# Patient Record
Sex: Female | Born: 1945 | Race: Black or African American | Hispanic: No | Smoking: Never smoker
Health system: Southern US, Community
[De-identification: ages and names within clinical notes are randomized; demographics above are authoritative.]

## PROBLEM LIST (undated history)

## (undated) DIAGNOSIS — F329 Major depressive disorder, single episode, unspecified: Secondary | ICD-10-CM

## (undated) DIAGNOSIS — F209 Schizophrenia, unspecified: Secondary | ICD-10-CM

## (undated) DIAGNOSIS — R0600 Dyspnea, unspecified: Secondary | ICD-10-CM

## (undated) DIAGNOSIS — M1990 Unspecified osteoarthritis, unspecified site: Secondary | ICD-10-CM

## (undated) DIAGNOSIS — I5030 Unspecified diastolic (congestive) heart failure: Secondary | ICD-10-CM

## (undated) DIAGNOSIS — C50919 Malignant neoplasm of unspecified site of unspecified female breast: Secondary | ICD-10-CM

## (undated) DIAGNOSIS — R110 Nausea: Secondary | ICD-10-CM

## (undated) DIAGNOSIS — I779 Disorder of arteries and arterioles, unspecified: Secondary | ICD-10-CM

## (undated) DIAGNOSIS — K219 Gastro-esophageal reflux disease without esophagitis: Secondary | ICD-10-CM

## (undated) DIAGNOSIS — F419 Anxiety disorder, unspecified: Secondary | ICD-10-CM

## (undated) DIAGNOSIS — F32A Depression, unspecified: Secondary | ICD-10-CM

## (undated) DIAGNOSIS — I10 Essential (primary) hypertension: Secondary | ICD-10-CM

## (undated) HISTORY — DX: Nausea: R11.0

## (undated) HISTORY — DX: Malignant neoplasm of unspecified site of unspecified female breast: C50.919

## (undated) HISTORY — PX: BREAST SURGERY: SHX581

## (undated) HISTORY — PX: TONSILLECTOMY: SUR1361

## (undated) HISTORY — PX: MASTECTOMY: SHX3

## (undated) HISTORY — DX: Gastro-esophageal reflux disease without esophagitis: K21.9

---

## 2010-01-05 ENCOUNTER — Ambulatory Visit: Payer: Self-pay | Admitting: Diagnostic Radiology

## 2010-01-05 ENCOUNTER — Emergency Department (HOSPITAL_BASED_OUTPATIENT_CLINIC_OR_DEPARTMENT_OTHER): Admission: EM | Admit: 2010-01-05 | Discharge: 2010-01-05 | Payer: Self-pay | Admitting: Emergency Medicine

## 2010-03-01 ENCOUNTER — Ambulatory Visit: Payer: Self-pay | Admitting: Oncology

## 2010-03-09 LAB — COMPREHENSIVE METABOLIC PANEL
ALT: <8 U/L (ref 0–35)
AST: 12 U/L (ref 0–37)
Albumin: 4.2 g/dL (ref 3.5–5.2)
Alkaline Phosphatase: 72 U/L (ref 39–117)
BUN: 12 mg/dL (ref 6–23)
CO2: 27 mEq/L (ref 19–32)
Calcium: 8.9 mg/dL (ref 8.4–10.5)
Chloride: 102 mEq/L (ref 96–112)
Creatinine, Ser: 0.83 mg/dL (ref 0.40–1.20)
Glucose, Bld: 95 mg/dL (ref 70–99)
Potassium: 3.7 mEq/L (ref 3.5–5.3)
Sodium: 138 mEq/L (ref 135–145)
Total Bilirubin: 0.3 mg/dL (ref 0.3–1.2)
Total Protein: 7 g/dL (ref 6.0–8.3)

## 2010-03-09 LAB — CBC WITH DIFFERENTIAL/PLATELET
BASO%: 0.6 % (ref 0.0–2.0)
Basophils Absolute: 0 10*3/uL (ref 0.0–0.1)
EOS%: 1.3 % (ref 0.0–7.0)
Eosinophils Absolute: 0.1 10*3/uL (ref 0.0–0.5)
HCT: 34.3 % — ABNORMAL LOW (ref 34.8–46.6)
HGB: 11.6 g/dL (ref 11.6–15.9)
LYMPH%: 25.5 % (ref 14.0–49.7)
MCH: 28.2 pg (ref 25.1–34.0)
MCHC: 33.8 g/dL (ref 31.5–36.0)
MCV: 83.4 fL (ref 79.5–101.0)
MONO#: 0.6 10*3/uL (ref 0.1–0.9)
MONO%: 9.9 % (ref 0.0–14.0)
NEUT#: 3.6 10*3/uL (ref 1.5–6.5)
NEUT%: 62.7 % (ref 38.4–76.8)
Platelets: 226 10*3/uL (ref 145–400)
RBC: 4.11 10*6/uL (ref 3.70–5.45)
RDW: 14.3 % (ref 11.2–14.5)
WBC: 5.8 10*3/uL (ref 3.9–10.3)
lymph#: 1.5 10*3/uL (ref 0.9–3.3)

## 2010-03-09 LAB — CANCER ANTIGEN 27.29: CA 27.29: 24 U/mL (ref 0–39)

## 2010-03-12 ENCOUNTER — Emergency Department (HOSPITAL_COMMUNITY)
Admission: EM | Admit: 2010-03-12 | Discharge: 2010-03-12 | Payer: Self-pay | Source: Home / Self Care | Admitting: Emergency Medicine

## 2010-03-21 LAB — URINE CULTURE
Colony Count: 10000
Culture  Setup Time: 201201071721

## 2010-03-21 LAB — DIFFERENTIAL
Basophils Absolute: 0 10*3/uL (ref 0.0–0.1)
Basophils Relative: 1 % (ref 0–1)
Eosinophils Absolute: 0.1 10*3/uL (ref 0.0–0.7)
Eosinophils Relative: 2 % (ref 0–5)
Lymphocytes Relative: 27 % (ref 12–46)
Lymphs Abs: 1.6 10*3/uL (ref 0.7–4.0)
Monocytes Absolute: 0.5 10*3/uL (ref 0.1–1.0)
Monocytes Relative: 8 % (ref 3–12)
Neutro Abs: 3.6 10*3/uL (ref 1.7–7.7)
Neutrophils Relative %: 63 % (ref 43–77)

## 2010-03-21 LAB — URINALYSIS, ROUTINE W REFLEX MICROSCOPIC
Bilirubin Urine: NEGATIVE
Hgb urine dipstick: NEGATIVE
Ketones, ur: NEGATIVE mg/dL
Nitrite: NEGATIVE
Protein, ur: NEGATIVE mg/dL
Specific Gravity, Urine: 1.015 (ref 1.005–1.030)
Urine Glucose, Fasting: NEGATIVE mg/dL
Urobilinogen, UA: 0.2 mg/dL (ref 0.0–1.0)
pH: 7 (ref 5.0–8.0)

## 2010-03-21 LAB — RAPID URINE DRUG SCREEN, HOSP PERFORMED
Amphetamines: NOT DETECTED
Barbiturates: NOT DETECTED
Benzodiazepines: NOT DETECTED
Cocaine: NOT DETECTED
Opiates: NOT DETECTED
Tetrahydrocannabinol: NOT DETECTED

## 2010-03-21 LAB — CBC
HCT: 37.2 % (ref 36.0–46.0)
Hemoglobin: 11.8 g/dL — ABNORMAL LOW (ref 12.0–15.0)
MCH: 27.1 pg (ref 26.0–34.0)
MCHC: 31.7 g/dL (ref 30.0–36.0)
MCV: 85.3 fL (ref 78.0–100.0)
Platelets: 226 10*3/uL (ref 150–400)
RBC: 4.36 MIL/uL (ref 3.87–5.11)
RDW: 13.8 % (ref 11.5–15.5)
WBC: 5.8 10*3/uL (ref 4.0–10.5)

## 2010-03-21 LAB — COMPREHENSIVE METABOLIC PANEL
ALT: 10 U/L (ref 0–35)
AST: 15 U/L (ref 0–37)
Albumin: 3.8 g/dL (ref 3.5–5.2)
Alkaline Phosphatase: 71 U/L (ref 39–117)
BUN: 9 mg/dL (ref 6–23)
CO2: 27 mEq/L (ref 19–32)
Calcium: 9.1 mg/dL (ref 8.4–10.5)
Chloride: 107 mEq/L (ref 96–112)
Creatinine, Ser: 0.86 mg/dL (ref 0.4–1.2)
GFR calc Af Amer: >60 mL/min (ref >60)
GFR calc non Af Amer: >60 mL/min (ref >60)
Glucose, Bld: 99 mg/dL (ref 70–99)
Potassium: 4.1 mEq/L (ref 3.5–5.1)
Sodium: 142 mEq/L (ref 135–145)
Total Bilirubin: 0.6 mg/dL (ref 0.3–1.2)
Total Protein: 6.8 g/dL (ref 6.0–8.3)

## 2010-03-21 LAB — SAMPLE TO BLOOD BANK

## 2010-03-21 LAB — ETHANOL: Alcohol, Ethyl (B): <5 mg/dL (ref 0–10)

## 2010-03-23 ENCOUNTER — Ambulatory Visit (HOSPITAL_COMMUNITY)
Admission: RE | Admit: 2010-03-23 | Discharge: 2010-03-23 | Payer: Self-pay | Source: Home / Self Care | Attending: Oncology | Admitting: Oncology

## 2010-04-20 ENCOUNTER — Other Ambulatory Visit: Payer: Self-pay | Admitting: Oncology

## 2010-04-20 DIAGNOSIS — Z853 Personal history of malignant neoplasm of breast: Secondary | ICD-10-CM

## 2010-04-20 DIAGNOSIS — Z780 Asymptomatic menopausal state: Secondary | ICD-10-CM

## 2010-04-27 ENCOUNTER — Ambulatory Visit
Admission: RE | Admit: 2010-04-27 | Discharge: 2010-04-27 | Disposition: A | Discharge status: Home / Self Care | Payer: Self-pay | Source: Ambulatory Visit | Attending: Oncology | Admitting: Oncology

## 2010-04-27 DIAGNOSIS — Z780 Asymptomatic menopausal state: Secondary | ICD-10-CM

## 2010-04-27 DIAGNOSIS — Z853 Personal history of malignant neoplasm of breast: Secondary | ICD-10-CM

## 2010-05-03 ENCOUNTER — Other Ambulatory Visit: Payer: Self-pay | Admitting: Oncology

## 2010-05-03 ENCOUNTER — Ambulatory Visit (HOSPITAL_COMMUNITY): Payer: Medicare Other | Attending: Oncology

## 2010-05-03 DIAGNOSIS — N289 Disorder of kidney and ureter, unspecified: Secondary | ICD-10-CM

## 2010-05-10 ENCOUNTER — Ambulatory Visit (HOSPITAL_COMMUNITY)
Admission: RE | Admit: 2010-05-10 | Discharge: 2010-05-10 | Disposition: A | Discharge status: Home / Self Care | Payer: Medicare Other | Source: Ambulatory Visit | Attending: Oncology | Admitting: Oncology

## 2010-05-10 DIAGNOSIS — Z853 Personal history of malignant neoplasm of breast: Secondary | ICD-10-CM | POA: Insufficient documentation

## 2010-05-10 DIAGNOSIS — N289 Disorder of kidney and ureter, unspecified: Secondary | ICD-10-CM

## 2010-05-10 DIAGNOSIS — N281 Cyst of kidney, acquired: Secondary | ICD-10-CM | POA: Insufficient documentation

## 2010-05-17 ENCOUNTER — Other Ambulatory Visit: Payer: Self-pay | Admitting: Oncology

## 2010-05-17 ENCOUNTER — Encounter (HOSPITAL_BASED_OUTPATIENT_CLINIC_OR_DEPARTMENT_OTHER): Payer: Medicare Other | Admitting: Oncology

## 2010-05-17 DIAGNOSIS — C50919 Malignant neoplasm of unspecified site of unspecified female breast: Secondary | ICD-10-CM

## 2010-05-17 LAB — URINALYSIS, ROUTINE W REFLEX MICROSCOPIC
Bilirubin Urine: NEGATIVE
Glucose, UA: NEGATIVE mg/dL
Hgb urine dipstick: NEGATIVE
Ketones, ur: 15 mg/dL — AB
Nitrite: NEGATIVE
Protein, ur: NEGATIVE mg/dL
Specific Gravity, Urine: 1.009 (ref 1.005–1.030)
Urobilinogen, UA: 0.2 mg/dL (ref 0.0–1.0)
pH: 7.5 (ref 5.0–8.0)

## 2010-05-17 LAB — BASIC METABOLIC PANEL
BUN: 10 mg/dL (ref 6–23)
CO2: 26 mEq/L (ref 19–32)
Calcium: 9.8 mg/dL (ref 8.4–10.5)
Chloride: 107 mEq/L (ref 96–112)
Creatinine, Ser: 0.8 mg/dL (ref 0.4–1.2)
GFR calc Af Amer: >60 mL/min (ref >60)
GFR calc non Af Amer: >60 mL/min (ref >60)
Glucose, Bld: 96 mg/dL (ref 70–99)
Potassium: 4.1 mEq/L (ref 3.5–5.1)
Sodium: 146 mEq/L — ABNORMAL HIGH (ref 135–145)

## 2010-05-17 LAB — DIFFERENTIAL
Basophils Absolute: 0.1 10*3/uL (ref 0.0–0.1)
Basophils Relative: 1 % (ref 0–1)
Eosinophils Absolute: 0 10*3/uL (ref 0.0–0.7)
Eosinophils Relative: 0 % (ref 0–5)
Lymphocytes Relative: 23 % (ref 12–46)
Lymphs Abs: 1.8 10*3/uL (ref 0.7–4.0)
Monocytes Absolute: 0.3 10*3/uL (ref 0.1–1.0)
Monocytes Relative: 4 % (ref 3–12)
Neutro Abs: 5.6 10*3/uL (ref 1.7–7.7)
Neutrophils Relative %: 73 % (ref 43–77)

## 2010-05-17 LAB — POCT TOXICOLOGY PANEL

## 2010-05-17 LAB — CBC WITH DIFFERENTIAL/PLATELET
BASO%: 0.5 % (ref 0.0–2.0)
Basophils Absolute: 0 10*3/uL (ref 0.0–0.1)
EOS%: 2.9 % (ref 0.0–7.0)
Eosinophils Absolute: 0.2 10*3/uL (ref 0.0–0.5)
HCT: 35.8 % (ref 34.8–46.6)
HGB: 11.3 g/dL — ABNORMAL LOW (ref 11.6–15.9)
LYMPH%: 36.4 % (ref 14.0–49.7)
MCH: 25.3 pg (ref 25.1–34.0)
MCHC: 31.6 g/dL (ref 31.5–36.0)
MCV: 80.1 fL (ref 79.5–101.0)
MONO#: 0.8 10*3/uL (ref 0.1–0.9)
MONO%: 10.4 % (ref 0.0–14.0)
NEUT#: 3.8 10*3/uL (ref 1.5–6.5)
NEUT%: 49.8 % (ref 38.4–76.8)
Platelets: 260 10*3/uL (ref 145–400)
RBC: 4.47 10*6/uL (ref 3.70–5.45)
RDW: 15.2 % — ABNORMAL HIGH (ref 11.2–14.5)
WBC: 7.5 10*3/uL (ref 3.9–10.3)
lymph#: 2.7 10*3/uL (ref 0.9–3.3)
nRBC: 0 % (ref 0–0)

## 2010-05-17 LAB — CBC
HCT: 42.9 % (ref 36.0–46.0)
Hemoglobin: 14.3 g/dL (ref 12.0–15.0)
MCH: 28.1 pg (ref 26.0–34.0)
MCHC: 33.4 g/dL (ref 30.0–36.0)
MCV: 83.9 fL (ref 78.0–100.0)
Platelets: 233 10*3/uL (ref 150–400)
RBC: 5.11 MIL/uL (ref 3.87–5.11)
RDW: 14.9 % (ref 11.5–15.5)
WBC: 7.8 10*3/uL (ref 4.0–10.5)

## 2010-05-17 LAB — ETHANOL: Alcohol, Ethyl (B): <10 mg/dL (ref 0–10)

## 2010-08-09 ENCOUNTER — Encounter (HOSPITAL_BASED_OUTPATIENT_CLINIC_OR_DEPARTMENT_OTHER): Payer: Medicare Other | Admitting: Oncology

## 2010-08-09 ENCOUNTER — Other Ambulatory Visit: Payer: Self-pay | Admitting: Oncology

## 2010-08-09 DIAGNOSIS — C50919 Malignant neoplasm of unspecified site of unspecified female breast: Secondary | ICD-10-CM

## 2010-08-09 LAB — COMPREHENSIVE METABOLIC PANEL
ALT: 10 U/L (ref 0–35)
AST: 15 U/L (ref 0–37)
Albumin: 4.2 g/dL (ref 3.5–5.2)
Alkaline Phosphatase: 86 U/L (ref 39–117)
BUN: 18 mg/dL (ref 6–23)
CO2: 30 mEq/L (ref 19–32)
Calcium: 9.2 mg/dL (ref 8.4–10.5)
Chloride: 105 mEq/L (ref 96–112)
Creatinine, Ser: 0.92 mg/dL (ref 0.50–1.10)
Glucose, Bld: 73 mg/dL (ref 70–99)
Potassium: 4.1 mEq/L (ref 3.5–5.3)
Sodium: 138 mEq/L (ref 135–145)
Total Bilirubin: 0.2 mg/dL — ABNORMAL LOW (ref 0.3–1.2)
Total Protein: 6.6 g/dL (ref 6.0–8.3)

## 2010-08-09 LAB — CBC WITH DIFFERENTIAL/PLATELET
BASO%: 0.9 % (ref 0.0–2.0)
Basophils Absolute: 0.1 10*3/uL (ref 0.0–0.1)
EOS%: 2.4 % (ref 0.0–7.0)
Eosinophils Absolute: 0.2 10*3/uL (ref 0.0–0.5)
HCT: 33.1 % — ABNORMAL LOW (ref 34.8–46.6)
HGB: 11 g/dL — ABNORMAL LOW (ref 11.6–15.9)
LYMPH%: 35.9 % (ref 14.0–49.7)
MCH: 25.5 pg (ref 25.1–34.0)
MCHC: 33.3 g/dL (ref 31.5–36.0)
MCV: 76.7 fL — ABNORMAL LOW (ref 79.5–101.0)
MONO#: 0.6 10*3/uL (ref 0.1–0.9)
MONO%: 9.3 % (ref 0.0–14.0)
NEUT#: 3.3 10*3/uL (ref 1.5–6.5)
NEUT%: 51.5 % (ref 38.4–76.8)
Platelets: 274 10*3/uL (ref 145–400)
RBC: 4.32 10*6/uL (ref 3.70–5.45)
RDW: 16 % — ABNORMAL HIGH (ref 11.2–14.5)
WBC: 6.3 10*3/uL (ref 3.9–10.3)
lymph#: 2.3 10*3/uL (ref 0.9–3.3)

## 2010-08-09 LAB — CANCER ANTIGEN 27.29: CA 27.29: 27 U/mL (ref 0–39)

## 2010-08-16 ENCOUNTER — Encounter (HOSPITAL_COMMUNITY): Payer: Self-pay

## 2010-08-16 ENCOUNTER — Ambulatory Visit (HOSPITAL_COMMUNITY)
Admission: RE | Admit: 2010-08-16 | Discharge: 2010-08-16 | Disposition: A | Discharge status: Home / Self Care | Payer: Medicare Other | Source: Ambulatory Visit | Attending: Oncology | Admitting: Oncology

## 2010-08-16 ENCOUNTER — Encounter (HOSPITAL_BASED_OUTPATIENT_CLINIC_OR_DEPARTMENT_OTHER): Payer: Medicare Other | Admitting: Oncology

## 2010-08-16 DIAGNOSIS — C50919 Malignant neoplasm of unspecified site of unspecified female breast: Secondary | ICD-10-CM | POA: Insufficient documentation

## 2010-08-16 DIAGNOSIS — M47814 Spondylosis without myelopathy or radiculopathy, thoracic region: Secondary | ICD-10-CM | POA: Insufficient documentation

## 2010-08-16 DIAGNOSIS — J984 Other disorders of lung: Secondary | ICD-10-CM | POA: Insufficient documentation

## 2010-08-16 DIAGNOSIS — Z9010 Acquired absence of unspecified breast and nipple: Secondary | ICD-10-CM | POA: Insufficient documentation

## 2010-08-16 DIAGNOSIS — I2510 Atherosclerotic heart disease of native coronary artery without angina pectoris: Secondary | ICD-10-CM | POA: Insufficient documentation

## 2010-08-16 DIAGNOSIS — Z170 Estrogen receptor positive status [ER+]: Secondary | ICD-10-CM

## 2010-08-16 HISTORY — DX: Malignant neoplasm of unspecified site of unspecified female breast: C50.919

## 2010-08-16 HISTORY — DX: Essential (primary) hypertension: I10

## 2010-08-16 MED ORDER — IOHEXOL 300 MG/ML  SOLN
80.0000 mL | Freq: Once | INTRAMUSCULAR | Status: AC | PRN
  Administered 2010-08-16: 80 mL via INTRAVENOUS

## 2010-11-24 ENCOUNTER — Other Ambulatory Visit: Payer: Self-pay | Admitting: Oncology

## 2010-11-24 ENCOUNTER — Encounter (HOSPITAL_BASED_OUTPATIENT_CLINIC_OR_DEPARTMENT_OTHER): Payer: Medicare Other | Admitting: Oncology

## 2010-11-24 DIAGNOSIS — C50919 Malignant neoplasm of unspecified site of unspecified female breast: Secondary | ICD-10-CM

## 2010-11-24 DIAGNOSIS — C4490 Unspecified malignant neoplasm of skin, unspecified: Secondary | ICD-10-CM

## 2010-11-24 LAB — COMPREHENSIVE METABOLIC PANEL
ALT: 20 U/L (ref 0–35)
AST: 20 U/L (ref 0–37)
Albumin: 3.9 g/dL (ref 3.5–5.2)
Alkaline Phosphatase: 103 U/L (ref 39–117)
BUN: 16 mg/dL (ref 6–23)
CO2: 29 mEq/L (ref 19–32)
Calcium: 10.1 mg/dL (ref 8.4–10.5)
Chloride: 103 mEq/L (ref 96–112)
Creatinine, Ser: 0.75 mg/dL (ref 0.50–1.10)
Glucose, Bld: 93 mg/dL (ref 70–99)
Potassium: 3.7 mEq/L (ref 3.5–5.3)
Sodium: 139 mEq/L (ref 135–145)
Total Bilirubin: 0.3 mg/dL (ref 0.3–1.2)
Total Protein: 7.4 g/dL (ref 6.0–8.3)

## 2010-11-24 LAB — CBC & DIFF AND RETIC
BASO%: 0.6 % (ref 0.0–2.0)
Basophils Absolute: 0 10*3/uL (ref 0.0–0.1)
EOS%: 1.9 % (ref 0.0–7.0)
Eosinophils Absolute: 0.1 10*3/uL (ref 0.0–0.5)
HCT: 38.1 % (ref 34.8–46.6)
HGB: 12.8 g/dL (ref 11.6–15.9)
Immature Retic Fract: 10.3 % — ABNORMAL HIGH (ref 1.60–10.00)
LYMPH%: 35.2 % (ref 14.0–49.7)
MCH: 26.9 pg (ref 25.1–34.0)
MCHC: 33.6 g/dL (ref 31.5–36.0)
MCV: 80.2 fL (ref 79.5–101.0)
MONO#: 0.5 10*3/uL (ref 0.1–0.9)
MONO%: 7.4 % (ref 0.0–14.0)
NEUT#: 3.5 10*3/uL (ref 1.5–6.5)
NEUT%: 54.9 % (ref 38.4–76.8)
Platelets: 228 10*3/uL (ref 145–400)
RBC: 4.75 10*6/uL (ref 3.70–5.45)
RDW: 16.8 % — ABNORMAL HIGH (ref 11.2–14.5)
Retic %: 1.56 % (ref 0.70–2.10)
Retic Ct Abs: 74.1 10*3/uL (ref 33.70–90.70)
WBC: 6.3 10*3/uL (ref 3.9–10.3)
lymph#: 2.2 10*3/uL (ref 0.9–3.3)
nRBC: 0 % (ref 0–0)

## 2010-11-24 LAB — FERRITIN: Ferritin: 21 ng/mL (ref 10–291)

## 2010-11-24 LAB — VITAMIN B12: Vitamin B-12: 1764 pg/mL — ABNORMAL HIGH (ref 211–911)

## 2010-11-24 LAB — FOLATE: Folate: >20 ng/mL

## 2010-11-24 LAB — CANCER ANTIGEN 27.29: CA 27.29: 26 U/mL (ref 0–39)

## 2010-11-28 ENCOUNTER — Encounter (HOSPITAL_BASED_OUTPATIENT_CLINIC_OR_DEPARTMENT_OTHER): Payer: Medicare Other | Admitting: Oncology

## 2010-11-28 ENCOUNTER — Other Ambulatory Visit: Payer: Self-pay | Admitting: Oncology

## 2010-11-28 DIAGNOSIS — Z9010 Acquired absence of unspecified breast and nipple: Secondary | ICD-10-CM

## 2010-11-28 DIAGNOSIS — C50919 Malignant neoplasm of unspecified site of unspecified female breast: Secondary | ICD-10-CM

## 2010-11-28 DIAGNOSIS — Z1231 Encounter for screening mammogram for malignant neoplasm of breast: Secondary | ICD-10-CM

## 2010-11-28 DIAGNOSIS — Z170 Estrogen receptor positive status [ER+]: Secondary | ICD-10-CM

## 2010-11-28 DIAGNOSIS — Z9012 Acquired absence of left breast and nipple: Secondary | ICD-10-CM

## 2011-01-10 ENCOUNTER — Ambulatory Visit: Payer: Medicare Other

## 2011-01-27 ENCOUNTER — Ambulatory Visit
Admission: RE | Admit: 2011-01-27 | Discharge: 2011-01-27 | Disposition: A | Discharge status: Home / Self Care | Payer: Medicare Other | Source: Ambulatory Visit | Attending: Oncology | Admitting: Oncology

## 2011-01-27 DIAGNOSIS — Z1231 Encounter for screening mammogram for malignant neoplasm of breast: Secondary | ICD-10-CM

## 2011-01-27 DIAGNOSIS — Z9012 Acquired absence of left breast and nipple: Secondary | ICD-10-CM

## 2011-03-04 ENCOUNTER — Telehealth: Payer: Self-pay | Admitting: Oncology

## 2011-03-04 NOTE — Telephone Encounter (Signed)
Per pt called dtr sabrina w/appts. gv sabrina appts for 1/17 and 1/24.

## 2011-03-20 ENCOUNTER — Telehealth: Payer: Self-pay | Admitting: Oncology

## 2011-03-20 NOTE — Telephone Encounter (Signed)
called pt s daughter lmovm that ots appt was r/s from 01/24 to 01/25 and to rtn call to confirm appt

## 2011-03-21 DIAGNOSIS — Z86010 Personal history of colonic polyps: Secondary | ICD-10-CM | POA: Diagnosis not present

## 2011-03-21 DIAGNOSIS — D509 Iron deficiency anemia, unspecified: Secondary | ICD-10-CM | POA: Diagnosis not present

## 2011-03-22 ENCOUNTER — Telehealth: Payer: Self-pay | Admitting: Oncology

## 2011-03-22 DIAGNOSIS — R0602 Shortness of breath: Secondary | ICD-10-CM | POA: Diagnosis not present

## 2011-03-22 NOTE — Telephone Encounter (Signed)
pts daughter rtn call and confirmed appt for 01/25 and 01/18

## 2011-03-23 ENCOUNTER — Telehealth: Payer: Self-pay | Admitting: Oncology

## 2011-03-23 ENCOUNTER — Other Ambulatory Visit: Payer: Medicare Other | Admitting: Lab

## 2011-03-23 NOTE — Telephone Encounter (Signed)
pts daughter called and r/s appt for 1-18 to 1-22

## 2011-03-24 ENCOUNTER — Other Ambulatory Visit: Payer: Medicare Other | Admitting: Lab

## 2011-03-28 ENCOUNTER — Other Ambulatory Visit: Payer: Medicare Other | Admitting: Lab

## 2011-03-28 DIAGNOSIS — Z170 Estrogen receptor positive status [ER+]: Secondary | ICD-10-CM | POA: Diagnosis not present

## 2011-03-28 DIAGNOSIS — C50919 Malignant neoplasm of unspecified site of unspecified female breast: Secondary | ICD-10-CM | POA: Diagnosis not present

## 2011-03-28 LAB — CBC WITH DIFFERENTIAL/PLATELET
BASO%: 0.4 % (ref 0.0–2.0)
Basophils Absolute: 0 10*3/uL (ref 0.0–0.1)
EOS%: 3.9 % (ref 0.0–7.0)
Eosinophils Absolute: 0.2 10*3/uL (ref 0.0–0.5)
HCT: 38.4 % (ref 34.8–46.6)
HGB: 12.9 g/dL (ref 11.6–15.9)
LYMPH%: 31.3 % (ref 14.0–49.7)
MCH: 28.9 pg (ref 25.1–34.0)
MCHC: 33.6 g/dL (ref 31.5–36.0)
MCV: 85.9 fL (ref 79.5–101.0)
MONO#: 0.5 10*3/uL (ref 0.1–0.9)
MONO%: 8.3 % (ref 0.0–14.0)
NEUT#: 3.1 10*3/uL (ref 1.5–6.5)
NEUT%: 56.1 % (ref 38.4–76.8)
Platelets: 218 10*3/uL (ref 145–400)
RBC: 4.47 10*6/uL (ref 3.70–5.45)
RDW: 14.8 % — ABNORMAL HIGH (ref 11.2–14.5)
WBC: 5.6 10*3/uL (ref 3.9–10.3)
lymph#: 1.8 10*3/uL (ref 0.9–3.3)

## 2011-03-28 LAB — COMPREHENSIVE METABOLIC PANEL
ALT: 16 U/L (ref 0–35)
AST: 18 U/L (ref 0–37)
Albumin: 4.4 g/dL (ref 3.5–5.2)
Alkaline Phosphatase: 90 U/L (ref 39–117)
BUN: 24 mg/dL — ABNORMAL HIGH (ref 6–23)
CO2: 25 mEq/L (ref 19–32)
Calcium: 9.4 mg/dL (ref 8.4–10.5)
Chloride: 104 mEq/L (ref 96–112)
Creatinine, Ser: 0.94 mg/dL (ref 0.50–1.10)
Glucose, Bld: 92 mg/dL (ref 70–99)
Potassium: 4.2 mEq/L (ref 3.5–5.3)
Sodium: 139 mEq/L (ref 135–145)
Total Bilirubin: 0.3 mg/dL (ref 0.3–1.2)
Total Protein: 6.9 g/dL (ref 6.0–8.3)

## 2011-03-28 LAB — CANCER ANTIGEN 27.29: CA 27.29: 18 U/mL (ref 0–39)

## 2011-03-30 ENCOUNTER — Ambulatory Visit: Payer: Medicare Other | Admitting: Oncology

## 2011-03-31 ENCOUNTER — Ambulatory Visit: Payer: Medicare Other | Admitting: Physician Assistant

## 2011-03-31 ENCOUNTER — Encounter: Payer: Self-pay | Admitting: Physician Assistant

## 2011-03-31 DIAGNOSIS — C50919 Malignant neoplasm of unspecified site of unspecified female breast: Secondary | ICD-10-CM

## 2011-03-31 DIAGNOSIS — C50912 Malignant neoplasm of unspecified site of left female breast: Secondary | ICD-10-CM | POA: Insufficient documentation

## 2011-03-31 HISTORY — DX: Malignant neoplasm of unspecified site of unspecified female breast: C50.919

## 2011-03-31 NOTE — Progress Notes (Signed)
Patient's daughter, Elizabeth [name], requests that appointment on 03/31/2011 be canceled due to inclement weather. This has been rescheduled per her request to January 30, 10:45 AM.

## 2011-04-05 ENCOUNTER — Ambulatory Visit (HOSPITAL_BASED_OUTPATIENT_CLINIC_OR_DEPARTMENT_OTHER): Payer: Medicare Other | Admitting: Physician Assistant

## 2011-04-05 ENCOUNTER — Encounter: Payer: Self-pay | Admitting: Physician Assistant

## 2011-04-05 VITALS — BP 128/68 | HR 71 | Temp 97.8°F | Ht 59.6 in | Wt 176.9 lb

## 2011-04-05 DIAGNOSIS — Z170 Estrogen receptor positive status [ER+]: Secondary | ICD-10-CM

## 2011-04-05 DIAGNOSIS — C50919 Malignant neoplasm of unspecified site of unspecified female breast: Secondary | ICD-10-CM

## 2011-04-05 NOTE — Progress Notes (Signed)
Hematology and Oncology Follow Up Visit  Elizabeth [name] [id number] 1945-10-24 66 y.o. 04/05/2011    HPI: The patient had been admitted to the [facility], she tells me, with a diagnosis of depression.  (I do not have those records.) While an in-patient, she says she noticed a lump in her left breast and brought it to medical attention.  Accordingly, on November 7th, 2011, she had bilateral digital mammography and left breast ultrasonography at the [facility].  In the left breast, at the site of a palpable lump, there was a focal density with ill-defined margins measuring approximately 2.1 cm.  By ultrasonography, this was a hypoechoic solid lesion measuring 1.9 cm with posterior acoustic shadowing and irregular margins.  Biopsy of the breast mass was recommended and performed on December 5th, 2011.  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  Hercept test was 1+ indicating no amplification.  The MIB-1 was borderline at 21.    With this information, the patient was referred to Dr. Micah Noel and on February 04, 2010, he proceeded to left sentinel lymph node sampling, this being positive for a micrometastatic deposit.  He then proceeded to left simple mastectomy with full axillary dissection.  An additional 10 lymph nodes were negative with tumor in this report ([id number]) measured 2.4 cm was described as Grade 2.  Margins were adequate, the closest being 0.5 cm.  There was no evidence of angiolymphatic invasion.    The patient decided to forego chemotherapy. She did not need postmastectomy radiation, and was started on letrozole 2.5 mg daily in January 2012. The plan is to continue on letrozole for total of 5 years.  Interim History:   Elizabeth [name] returns today accompanied by her daughter Elizabeth [name] for routine followup of her left breast carcinoma. Interval history is  unremarkable, and she continues on letrozole with good tolerance. She does have some occasional hot flashes at night, nothing particularly problematic. 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 She does have some intermittent queasiness, but denies any emesis. She has wondered if this is associated with the letrozole. She's had no change in bowel habits. She is up-to-date with her health maintenance, including routine physical and screening colonoscopy. She is exercising regularly, walking at least 3 times per week.  A detailed review of systems is otherwise noncontributory as noted below.  Review of Systems: Constitutional:  no weight loss, fever, night sweats and feels well Eyes: neagtive MVH:[id number] Cardiovascular: no chest pain or dyspnea on exertion Respiratory: no cough, shortness of breath, or wheezing Neurological: negative Dermatological: negative Gastrointestinal: occasional nausea, no emesis, no abdominal pain, change in bowel habits, or black or bloody stools Genito-Urinary: no dysuria, trouble voiding, or hematuria Hematological and Lymphatic: negative Breast: negative Musculoskeletal: positive for - joint pain, unchanged Remaining ROS negative.  PAST MEDICAL HISTORY:  Significant for history of depression + or - psychosis. I do not have a full psychiatric history and no documentation from her primary care physician at this point.  There is a history of high blood pressure, GERD, history of palpitation and history of seasonal allergies.  The patient is status post tonsillectomy and adenoidectomy.    FAMILY HISTORY:  The patient's father died from lung cancer at the age of 43.  He was a smoker.  The patient's mother died from lung cancer as well at the age of  69.  She did not smoke but of course was exposed to secondhand smoke and also worked in a tobacco factory.  The patient had 5 brothers and 7 sisters.  Two of the sisters had  breast cancer, one diagnosed at the age of 73.  The second sister, the patient does not know the age of diagnosis.  There is no history of ovarian cancer in the family and no other history of breast cancer to our knowledge.    GYN HISTORY:  She is GX P2, first pregnancy to term in her twenties.  She went through the change of life in her late 33s.  She never took hormone replacement.  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  She is divorced and lives alone in a senior citizen complex. This is not an assisted living situation.  She does her own medications, cooking, and all of her self care, but there are many group activities and she participates in them.  Her daughter, Elizabeth [name], is present today.  She directs marketing for a transportation company locally here in [place].  Elizabeth [name] has a daughter, age 44.  The patient's other child, a son, has died.  The patient is not a church attender at present.    Medications:   I have reviewed the patient's current medications.  Current Outpatient Prescriptions  Medication Sig Dispense Refill  . acetaminophen (TYLENOL) 325 MG tablet Take 650 mg by mouth every 6 (six) hours as needed.      Marland Kitchen aspirin 325 MG tablet Take 325 mg by mouth daily.      . cyanocobalamin 1000 MCG tablet Take 1,000 mcg by mouth daily.      Marland Kitchen docusate sodium (COLACE) 100 MG capsule Take 100 mg by mouth daily as needed.      . ferrous fumarate (HEMOCYTE - 106 MG FE) 325 (106 FE) MG TABS Take 1 tablet by mouth.      . letrozole (FEMARA) 2.5 MG tablet Take 2.5 mg by mouth daily.      Marland Kitchen lisinopril-hydrochlorothiazide (PRINZIDE,ZESTORETIC) 20-25 MG per tablet Take 1 tablet by mouth daily.      . pravastatin (PRAVACHOL) 20 MG tablet Take 20 mg by mouth daily.      . psyllium (METAMUCIL) 58.6 % packet Take 1 packet by mouth as needed.        Allergies: No Known Allergies  Physical  Exam: Filed Vitals:   04/05/11 1042  BP: 128/68  Pulse: 71  Temp: 97.8 F (36.6 C)   HEENT:  Sclerae anicteric, conjunctivae pink.  Oropharynx clear.  No mucositis or candidiasis.   Nodes:  No cervical, supraclavicular, or axillary lymphadenopathy palpated.  Breast Exam:  Right breast is unremarkable, no masses, skin changes, or nipple inversion. Patient status post left mastectomy. No specialist nodularity or skin changes. No evidence of local recurrence in the chest wall.   Lungs:  Clear to auscultation bilaterally.  No crackles, rhonchi, or wheezes.   Heart:  Regular rate and rhythm.  No gallops, murmurs, or rubs. Abdomen:  Soft, nontender.  Positive bowel sounds.  No organomegaly or masses palpated.   Musculoskeletal:  No focal spinal tenderness to palpation.  Extremities:  Benign.  No peripheral edema or cyanosis.   Skin:  Benign.   Neuro:  Nonfocal.   Lab Results: Lab Results  Component Value Date   WBC 5.6 03/28/2011   HGB  12.9 03/28/2011   HCT 38.4 03/28/2011   MCV 85.9 03/28/2011   PLT 218 03/28/2011   NEUTROABS 3.1 03/28/2011     Chemistry      Component Value Date/Time   NA 139 03/28/2011 1408   K 4.2 03/28/2011 1408   CL 104 03/28/2011 1408   CO2 25 03/28/2011 1408   BUN 24* 03/28/2011 1408   CREATININE 0.94 03/28/2011 1408      Component Value Date/Time   CALCIUM 9.4 03/28/2011 1408   ALKPHOS 90 03/28/2011 1408   AST 18 03/28/2011 1408   ALT 16 03/28/2011 1408   BILITOT 0.3 03/28/2011 1408        Radiological Studies:  02/06/2011 BILATERAL DIGITAL SCREENING MAMMOGRAM WITH CAD  Findings: Prior films are needed for interpretation.  IMPRESSION:  Prior exams will be requested for comparison. Following comparison with prior studies an addendum  will be made regarding further recommendations.  Images were processed with CAD.  ASSESSMENT: Negative - BI-RADS 1  Screening mammogram in 1 year.  Addendum  DIGITAL SCREENING MAMMOGRAM WITH CAD:  Comparison: Prior  studies.  There are scattered fibroglandular densities. There is no dominant mass, architectural distortion  or calcification to suggest malignancy.  Images were processed with CAD.  IMPRESSION:  No mammographic evidence of malignancy. Suggest yearly screening mammography.  A result letter of this screening mammogram will be mailed directly to the patient.  Addended by Dr. Gordan Payment on 02-03-11.    Assessment:  67 year old [place] woman   (1)  status post left mastectomy and axillary lymph node dissection December 2011 for a T2 N43mic, stage II, invasive ductal carcinoma, grade 2.  Strongly ER/PR positive, HER2/neu negative with a borderline MIB-1.    (2)  Decided to forego chemotherapy and did not need postmastectomy radiation.  Accordingly, was started on letrozole at 2.5 mg daily in January 2012, the plan being to continue for a total of 5 years.   Plan:  Brienne appears to be doing well, and there is no clinical evidence of disease recurrence. We discussed holding the letrozole for a few weeks to see if in fact her nausea subsides, but we both agree that it is very unlikely to be associated with the letrozole. Accordingly, she is continuing on the letrozole daily, 2.5 mg. The goal is to continue for total of 5 years.  She return in 6 months for routine followup.  This plan was reviewed with the patient, who voices understanding and agreement.  She knows to call with any changes or problems.    BERRY,AMY, PA-C 04/05/2011

## 2011-04-17 ENCOUNTER — Other Ambulatory Visit: Payer: Self-pay | Admitting: *Deleted

## 2011-04-17 MED ORDER — LETROZOLE 2.5 MG PO TABS: 2.5000 mg | ORAL_TABLET | Freq: Every day | ORAL | Status: DC

## 2011-04-27 MED ORDER — LETROZOLE 2.5 MG PO TABS: 2.5000 mg | ORAL_TABLET | Freq: Every day | ORAL | Status: AC

## 2011-09-21 DIAGNOSIS — I517 Cardiomegaly: Secondary | ICD-10-CM | POA: Diagnosis not present

## 2011-09-21 DIAGNOSIS — R0602 Shortness of breath: Secondary | ICD-10-CM | POA: Diagnosis not present

## 2011-09-21 DIAGNOSIS — I10 Essential (primary) hypertension: Secondary | ICD-10-CM | POA: Diagnosis not present

## 2011-09-28 DIAGNOSIS — I10 Essential (primary) hypertension: Secondary | ICD-10-CM | POA: Diagnosis not present

## 2011-09-28 DIAGNOSIS — I2789 Other specified pulmonary heart diseases: Secondary | ICD-10-CM | POA: Diagnosis not present

## 2011-09-28 DIAGNOSIS — R0602 Shortness of breath: Secondary | ICD-10-CM | POA: Diagnosis not present

## 2011-10-05 ENCOUNTER — Other Ambulatory Visit: Payer: Medicare Other | Admitting: Lab

## 2011-10-16 ENCOUNTER — Ambulatory Visit: Payer: Medicare Other | Admitting: Oncology

## 2011-12-13 DIAGNOSIS — Z23 Encounter for immunization: Secondary | ICD-10-CM | POA: Diagnosis not present

## 2011-12-18 ENCOUNTER — Other Ambulatory Visit (HOSPITAL_BASED_OUTPATIENT_CLINIC_OR_DEPARTMENT_OTHER): Payer: Medicare Other | Admitting: Lab

## 2011-12-18 ENCOUNTER — Ambulatory Visit (HOSPITAL_BASED_OUTPATIENT_CLINIC_OR_DEPARTMENT_OTHER): Payer: Medicare Other | Admitting: Oncology

## 2011-12-18 ENCOUNTER — Telehealth: Payer: Self-pay | Admitting: *Deleted

## 2011-12-18 VITALS — BP 188/82 | HR 80 | Temp 97.8°F | Resp 20 | Ht 59.6 in | Wt 167.2 lb

## 2011-12-18 DIAGNOSIS — C50919 Malignant neoplasm of unspecified site of unspecified female breast: Secondary | ICD-10-CM | POA: Diagnosis not present

## 2011-12-18 DIAGNOSIS — Z170 Estrogen receptor positive status [ER+]: Secondary | ICD-10-CM | POA: Diagnosis not present

## 2011-12-18 LAB — COMPREHENSIVE METABOLIC PANEL (CC13)
ALT: 19 U/L (ref 0–55)
AST: 17 U/L (ref 5–34)
Albumin: 4.2 g/dL (ref 3.5–5.0)
Alkaline Phosphatase: 101 U/L (ref 40–150)
BUN: 10 mg/dL (ref 7.0–26.0)
CO2: 24 mEq/L (ref 22–29)
Calcium: 9.8 mg/dL (ref 8.4–10.4)
Chloride: 108 mEq/L — ABNORMAL HIGH (ref 98–107)
Creatinine: 0.9 mg/dL (ref 0.6–1.1)
Glucose: 93 mg/dl (ref 70–99)
Potassium: 3.3 mEq/L — ABNORMAL LOW (ref 3.5–5.1)
Sodium: 141 mEq/L (ref 136–145)
Total Bilirubin: 0.5 mg/dL (ref 0.20–1.20)
Total Protein: 7.1 g/dL (ref 6.4–8.3)

## 2011-12-18 LAB — CBC WITH DIFFERENTIAL/PLATELET
BASO%: 0.7 % (ref 0.0–2.0)
Basophils Absolute: 0.1 10*3/uL (ref 0.0–0.1)
EOS%: 1.3 % (ref 0.0–7.0)
Eosinophils Absolute: 0.1 10*3/uL (ref 0.0–0.5)
HCT: 38 % (ref 34.8–46.6)
HGB: 12.8 g/dL (ref 11.6–15.9)
LYMPH%: 25.5 % (ref 14.0–49.7)
MCH: 28.8 pg (ref 25.1–34.0)
MCHC: 33.7 g/dL (ref 31.5–36.0)
MCV: 85.5 fL (ref 79.5–101.0)
MONO#: 0.6 10*3/uL (ref 0.1–0.9)
MONO%: 8.4 % (ref 0.0–14.0)
NEUT#: 4.9 10*3/uL (ref 1.5–6.5)
NEUT%: 64.1 % (ref 38.4–76.8)
Platelets: 215 10*3/uL (ref 145–400)
RBC: 4.44 10*6/uL (ref 3.70–5.45)
RDW: 14.1 % (ref 11.2–14.5)
WBC: 7.7 10*3/uL (ref 3.9–10.3)
lymph#: 2 10*3/uL (ref 0.9–3.3)

## 2011-12-18 MED ORDER — LETROZOLE 2.5 MG PO TABS: 2.5000 mg | ORAL_TABLET | Freq: Every day | ORAL | Status: DC

## 2011-12-18 NOTE — Telephone Encounter (Signed)
Gave patient appointment 06-17-2012 starting at 10:15am

## 2011-12-18 NOTE — Progress Notes (Signed)
ID: Elizabeth [name]   DOB: [birth year]  MR#: [id number]  CSN#:[id number]  PCP: No primary provider on file. GYN:  SU:  OTHER MD:   HISTORY OF PRESENT ILLNESS: The patient had been admitted to the [facility], she tells me, with a diagnosis of depression.  (I do not have those records.) While an in-patient, she says she noticed a lump in her left breast and brought it to medical attention.  Accordingly, on November 7th, 2011, she had bilateral digital mammography and left breast ultrasonography at the [facility].  In the left breast, at the site of a palpable lump, there was a focal density with ill-defined margins measuring approximately 2.1 cm.  By ultrasonography, this was a hypoechoic solid lesion measuring 1.9 cm with posterior acoustic shadowing and irregular margins.  Biopsy of the breast mass was recommended and performed on December 5th, 2011.  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  Hercept test was 1+ indicating no amplification.  The MIB-1 was borderline at 21.    With this information, the patient was referred to Dr. Micah Noel and on February 04, 2010, he proceeded to left sentinel lymph node sampling, this being positive for a micrometastatic deposit.  He then proceeded to left simple mastectomy with full axillary dissection.  An additional 10 lymph nodes were negative with tumor in this report ([id number]) measured 2.4 cm was described as Grade 2.  Margins were adequate, the closest being 0.5 cm.  There was no evidence of angiolymphatic invasion.  Her subsequent history is as detailed below.  INTERVAL HISTORY: The patient returns today with her daughter for followup of Spark yeast breast cancer. The interval history is generally unremarkable. She gets on her exercise bike for 5 days a week, and usually walks to the market every day.  REVIEW OF  SYSTEMS: She had gained some weight, but then lost it. She is on a better diet she says because of her cardiologist. She has a little bit of around the nose, occasional problems with nausea, has a bump in her right leg which she thinks comes from one of the times when she was walking to the market, and she continues to have some hallucinations and talking to herself according to her daughter. She is tolerating the letrozole with no obvious side effects. A detailed review of systems was otherwise noncontributory.  PAST MEDICAL HISTORY: Past Medical History  Diagnosis Date  . Breast CA     (Lt) breast ca dx 02/2010  . Hypertension   . Breast cancer 03/31/2011  Significant for history of depression + or - psychosis. I do not have a full psychiatric history and no documentation from her primary care physician at this point.  There is a history of high blood pressure, GERD, history of palpitation and history of seasonal allergies.  The patient is status post tonsillectomy and adenoidectomy.    PAST SURGICAL HISTORY: No past surgical history on file.  FAMILY HISTORY No family history on file. The patient's father died from lung cancer at the age of 68.  He was a smoker.  The patient's mother died from lung cancer as well at the age of 33.  She did not smoke but of course was exposed to secondhand smoke and also worked in a tobacco factory.  The patient had 5 brothers and 7 sisters.  Two of the sisters had breast cancer, one diagnosed at  the age of 77.  The second sister, the patient does not know the age of diagnosis.  There is no history of ovarian cancer in the family and no other history of breast cancer to our knowledge.    GYNECOLOGIC HISTORY: She is GX P2, first pregnancy to term in her twenties.  She went through the change of life in her late 80s.  She never took hormone replacement.  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  She is divorced and lives alone in a senior citizen complex. This is not an assisted living situation.  She does her own medications, cooking, and all of her self care, but there are many group activities and she participates in them.  Her daughter, [name], directs marketing for a transportation company locally here in [place].  [name] has a daughter, age 88.  The patient's other child, a son, has died.  The patient is not a church attender at present.     ADVANCED DIRECTIVES: in place  HEALTH MAINTENANCE: History  Substance Use Topics  . Smoking status: Never Smoker   . Smokeless tobacco: Never Used  . Alcohol Use: No     Colonoscopy:  PAP:  Bone density:  Lipid panel:  No Known Allergies  Current Outpatient Prescriptions  Medication Sig Dispense Refill  . acetaminophen (TYLENOL) 325 MG tablet Take 650 mg by mouth every 6 (six) hours as needed.      Marland Kitchen aspirin 325 MG tablet Take 325 mg by mouth daily.      . cyanocobalamin 1000 MCG tablet Take 1,000 mcg by mouth daily.      Marland Kitchen docusate sodium (COLACE) 100 MG capsule Take 100 mg by mouth daily as needed.      . ferrous fumarate (HEMOCYTE - 106 MG FE) 325 (106 FE) MG TABS Take 1 tablet by mouth.      Marland Kitchen lisinopril-hydrochlorothiazide (PRINZIDE,ZESTORETIC) 20-25 MG per tablet Take 1 tablet by mouth daily.      . mirtazapine (REMERON) 15 MG tablet Take 15 mg by mouth at bedtime.      . pravastatin (PRAVACHOL) 20 MG tablet Take 20 mg by mouth daily.      . psyllium (METAMUCIL) 58.6 % packet Take 1 packet by mouth as needed.        OBJECTIVE: Middle-aged [place] American woman who appears well Filed Vitals:   12/18/11 1146  BP: 188/82  Pulse: 80  Temp: 97.8 F (36.6 C)  Resp: 20     Body mass index is 33.09 kg/(m^2).    ECOG FS: 0 Sclerae unicteric Oropharynx clear No cervical or supraclavicular adenopathy Lungs no rales or rhonchi Heart regular rate and rhythm Abd benign MSK no focal spinal  tenderness, no peripheral edema Neuro: nonfocal Breasts: The right breast is unremarkable. The left breast is status post mastectomy. There is no evidence of local recurrence.  LAB RESULTS: Lab Results  Component Value Date   WBC 5.6 03/28/2011   NEUTROABS 3.1 03/28/2011   HGB 12.9 03/28/2011   HCT 38.4 03/28/2011   MCV 85.9 03/28/2011   PLT 218 03/28/2011      Chemistry      Component Value Date/Time   NA 139 03/28/2011 1408   K 4.2 03/28/2011 1408   CL 104 03/28/2011 1408   CO2 25 03/28/2011 1408   BUN 24* 03/28/2011 1408   CREATININE 0.94 03/28/2011 1408  Component Value Date/Time   CALCIUM 9.4 03/28/2011 1408   ALKPHOS 90 03/28/2011 1408   AST 18 03/28/2011 1408   ALT 16 03/28/2011 1408   BILITOT 0.3 03/28/2011 1408       Lab Results  Component Value Date   LABCA2 18 03/28/2011    No components found with this basename: [id number]    No results found for this basename: INR:1;PROTIME:1 in the last 168 hours  Urinalysis    Component Value Date/Time   COLORURINE YELLOW 03/12/2010 1052   APPEARANCEUR CLEAR 03/12/2010 1052   LABSPEC 1.015 03/12/2010 1052   PHURINE 7.0 03/12/2010 1052   GLUCOSEU NEGATIVE 01/05/2010 2200   HGBUR NEGATIVE 03/12/2010 1052   BILIRUBINUR NEGATIVE 03/12/2010 1052   KETONESUR NEGATIVE 03/12/2010 1052   PROTEINUR NEGATIVE 03/12/2010 1052   UROBILINOGEN 0.2 03/12/2010 1052   NITRITE NEGATIVE 03/12/2010 1052   LEUKOCYTESUR NEGATIVE MICROSCOPIC NOT DONE ON URINES WITH NEGATIVE PROTEIN, BLOOD, LEUKOCYTES, NITRITE, OR GLUCOSE <1000 mg/dL. 03/12/2010 1052    STUDIES: No results found.  ASSESSMENT: 66 y.o. [place] woman status post left mastectomy and axillary lymph node dissection December 2011 for a T2 N29mic, stage IIA, invasive ductal carcinoma, grade 2.  Strongly ER/PR positive, HER2/neu negative with a borderline MIB-1.  Decided to forego chemotherapy and did not need postmastectomy radiation.  Accordingly, was started on letrozole in January 2012  PLAN: She is  tolerating the letrozole well, and I am making no changes in her overall treatment plan. She'll see [place] again in 6 months. We will try to complete a total of 5 years of letrozole before discharging her from followup.   MAGRINAT,GUSTAV C    12/18/2011

## 2011-12-19 LAB — CANCER ANTIGEN 27.29: CA 27.29: 23 U/mL (ref 0–39)

## 2011-12-26 DIAGNOSIS — F259 Schizoaffective disorder, unspecified: Secondary | ICD-10-CM | POA: Diagnosis not present

## 2012-01-12 DIAGNOSIS — F29 Unspecified psychosis not due to a substance or known physiological condition: Secondary | ICD-10-CM | POA: Diagnosis not present

## 2012-01-12 DIAGNOSIS — H5316 Psychophysical visual disturbances: Secondary | ICD-10-CM | POA: Diagnosis not present

## 2012-01-12 DIAGNOSIS — I10 Essential (primary) hypertension: Secondary | ICD-10-CM | POA: Diagnosis present

## 2012-01-12 DIAGNOSIS — M25519 Pain in unspecified shoulder: Secondary | ICD-10-CM | POA: Diagnosis present

## 2012-01-12 DIAGNOSIS — E538 Deficiency of other specified B group vitamins: Secondary | ICD-10-CM | POA: Diagnosis present

## 2012-01-12 DIAGNOSIS — E7800 Pure hypercholesterolemia, unspecified: Secondary | ICD-10-CM | POA: Diagnosis present

## 2012-01-12 DIAGNOSIS — M479 Spondylosis, unspecified: Secondary | ICD-10-CM | POA: Diagnosis not present

## 2012-01-12 DIAGNOSIS — K5900 Constipation, unspecified: Secondary | ICD-10-CM | POA: Diagnosis present

## 2012-01-12 DIAGNOSIS — F200 Paranoid schizophrenia: Secondary | ICD-10-CM | POA: Diagnosis not present

## 2012-01-12 DIAGNOSIS — F259 Schizoaffective disorder, unspecified: Secondary | ICD-10-CM | POA: Diagnosis present

## 2012-01-12 DIAGNOSIS — Z0189 Encounter for other specified special examinations: Secondary | ICD-10-CM | POA: Diagnosis not present

## 2012-02-16 ENCOUNTER — Other Ambulatory Visit: Payer: Self-pay | Admitting: Oncology

## 2012-02-16 DIAGNOSIS — Z9012 Acquired absence of left breast and nipple: Secondary | ICD-10-CM

## 2012-02-16 DIAGNOSIS — Z853 Personal history of malignant neoplasm of breast: Secondary | ICD-10-CM

## 2012-02-16 DIAGNOSIS — Z1231 Encounter for screening mammogram for malignant neoplasm of breast: Secondary | ICD-10-CM

## 2012-03-02 DIAGNOSIS — R609 Edema, unspecified: Secondary | ICD-10-CM | POA: Diagnosis not present

## 2012-03-02 DIAGNOSIS — I8310 Varicose veins of unspecified lower extremity with inflammation: Secondary | ICD-10-CM | POA: Diagnosis not present

## 2012-03-12 DIAGNOSIS — E538 Deficiency of other specified B group vitamins: Secondary | ICD-10-CM | POA: Diagnosis not present

## 2012-03-12 DIAGNOSIS — Z0000 Encounter for general adult medical examination without abnormal findings: Secondary | ICD-10-CM | POA: Diagnosis not present

## 2012-03-12 DIAGNOSIS — E785 Hyperlipidemia, unspecified: Secondary | ICD-10-CM | POA: Diagnosis not present

## 2012-03-12 DIAGNOSIS — R7301 Impaired fasting glucose: Secondary | ICD-10-CM | POA: Diagnosis not present

## 2012-03-12 DIAGNOSIS — J3089 Other allergic rhinitis: Secondary | ICD-10-CM | POA: Diagnosis not present

## 2012-03-12 DIAGNOSIS — F329 Major depressive disorder, single episode, unspecified: Secondary | ICD-10-CM | POA: Diagnosis not present

## 2012-03-12 DIAGNOSIS — I10 Essential (primary) hypertension: Secondary | ICD-10-CM | POA: Diagnosis not present

## 2012-03-12 DIAGNOSIS — I509 Heart failure, unspecified: Secondary | ICD-10-CM | POA: Diagnosis not present

## 2012-03-12 DIAGNOSIS — E559 Vitamin D deficiency, unspecified: Secondary | ICD-10-CM | POA: Diagnosis not present

## 2012-03-12 DIAGNOSIS — I119 Hypertensive heart disease without heart failure: Secondary | ICD-10-CM | POA: Diagnosis not present

## 2012-03-19 DIAGNOSIS — I739 Peripheral vascular disease, unspecified: Secondary | ICD-10-CM | POA: Diagnosis not present

## 2012-03-19 DIAGNOSIS — G4733 Obstructive sleep apnea (adult) (pediatric): Secondary | ICD-10-CM | POA: Diagnosis not present

## 2012-03-19 DIAGNOSIS — I509 Heart failure, unspecified: Secondary | ICD-10-CM | POA: Diagnosis not present

## 2012-03-19 DIAGNOSIS — I119 Hypertensive heart disease without heart failure: Secondary | ICD-10-CM | POA: Diagnosis not present

## 2012-03-20 DIAGNOSIS — G4710 Hypersomnia, unspecified: Secondary | ICD-10-CM | POA: Diagnosis not present

## 2012-03-22 DIAGNOSIS — I2789 Other specified pulmonary heart diseases: Secondary | ICD-10-CM | POA: Diagnosis not present

## 2012-03-22 DIAGNOSIS — I10 Essential (primary) hypertension: Secondary | ICD-10-CM | POA: Diagnosis not present

## 2012-03-22 DIAGNOSIS — R0602 Shortness of breath: Secondary | ICD-10-CM | POA: Diagnosis not present

## 2012-03-29 ENCOUNTER — Ambulatory Visit
Admission: RE | Admit: 2012-03-29 | Discharge: 2012-03-29 | Disposition: A | Discharge status: Home / Self Care | Payer: Medicare Other | Source: Ambulatory Visit | Attending: Oncology | Admitting: Oncology

## 2012-03-29 DIAGNOSIS — Z1231 Encounter for screening mammogram for malignant neoplasm of breast: Secondary | ICD-10-CM | POA: Diagnosis not present

## 2012-03-29 DIAGNOSIS — Z9012 Acquired absence of left breast and nipple: Secondary | ICD-10-CM

## 2012-03-29 DIAGNOSIS — Z853 Personal history of malignant neoplasm of breast: Secondary | ICD-10-CM

## 2012-04-01 ENCOUNTER — Other Ambulatory Visit: Payer: Self-pay | Admitting: Oncology

## 2012-04-01 DIAGNOSIS — I10 Essential (primary) hypertension: Secondary | ICD-10-CM | POA: Diagnosis not present

## 2012-04-01 DIAGNOSIS — R928 Other abnormal and inconclusive findings on diagnostic imaging of breast: Secondary | ICD-10-CM

## 2012-04-10 ENCOUNTER — Ambulatory Visit
Admission: RE | Admit: 2012-04-10 | Discharge: 2012-04-10 | Disposition: A | Discharge status: Home / Self Care | Payer: Medicare Other | Source: Ambulatory Visit | Attending: Oncology | Admitting: Oncology

## 2012-04-10 DIAGNOSIS — R928 Other abnormal and inconclusive findings on diagnostic imaging of breast: Secondary | ICD-10-CM

## 2012-04-10 DIAGNOSIS — N6009 Solitary cyst of unspecified breast: Secondary | ICD-10-CM | POA: Diagnosis not present

## 2012-04-17 ENCOUNTER — Other Ambulatory Visit: Payer: Self-pay | Admitting: Physician Assistant

## 2012-04-17 DIAGNOSIS — Z853 Personal history of malignant neoplasm of breast: Secondary | ICD-10-CM

## 2012-04-17 DIAGNOSIS — N6001 Solitary cyst of right breast: Secondary | ICD-10-CM

## 2012-04-22 DIAGNOSIS — I10 Essential (primary) hypertension: Secondary | ICD-10-CM | POA: Diagnosis not present

## 2012-04-22 DIAGNOSIS — I2789 Other specified pulmonary heart diseases: Secondary | ICD-10-CM | POA: Diagnosis not present

## 2012-04-22 DIAGNOSIS — R0602 Shortness of breath: Secondary | ICD-10-CM | POA: Diagnosis not present

## 2012-04-26 ENCOUNTER — Telehealth: Payer: Self-pay | Admitting: Oncology

## 2012-04-26 NOTE — Telephone Encounter (Signed)
Per pt s/w her dtr Elizabeth [name] re mammo/us @ [facility] for 8/6. Also confirmed w/dtr appt lb/GM for 4/14.

## 2012-06-06 DIAGNOSIS — R109 Unspecified abdominal pain: Secondary | ICD-10-CM | POA: Diagnosis not present

## 2012-06-06 DIAGNOSIS — E559 Vitamin D deficiency, unspecified: Secondary | ICD-10-CM | POA: Diagnosis not present

## 2012-06-06 DIAGNOSIS — E538 Deficiency of other specified B group vitamins: Secondary | ICD-10-CM | POA: Diagnosis not present

## 2012-06-06 DIAGNOSIS — I119 Hypertensive heart disease without heart failure: Secondary | ICD-10-CM | POA: Diagnosis not present

## 2012-06-06 DIAGNOSIS — E785 Hyperlipidemia, unspecified: Secondary | ICD-10-CM | POA: Diagnosis not present

## 2012-06-06 DIAGNOSIS — I10 Essential (primary) hypertension: Secondary | ICD-10-CM | POA: Diagnosis not present

## 2012-06-06 DIAGNOSIS — I509 Heart failure, unspecified: Secondary | ICD-10-CM | POA: Diagnosis not present

## 2012-06-06 DIAGNOSIS — R7301 Impaired fasting glucose: Secondary | ICD-10-CM | POA: Diagnosis not present

## 2012-06-17 ENCOUNTER — Telehealth: Payer: Self-pay | Admitting: *Deleted

## 2012-06-17 ENCOUNTER — Encounter: Payer: Self-pay | Admitting: Physician Assistant

## 2012-06-17 ENCOUNTER — Other Ambulatory Visit (HOSPITAL_BASED_OUTPATIENT_CLINIC_OR_DEPARTMENT_OTHER): Payer: Medicare Other | Admitting: Lab

## 2012-06-17 ENCOUNTER — Ambulatory Visit (HOSPITAL_BASED_OUTPATIENT_CLINIC_OR_DEPARTMENT_OTHER): Payer: Medicare Other | Admitting: Physician Assistant

## 2012-06-17 VITALS — BP 135/72 | HR 73 | Temp 97.7°F | Resp 20 | Ht 59.0 in | Wt 157.1 lb

## 2012-06-17 DIAGNOSIS — Z9010 Acquired absence of unspecified breast and nipple: Secondary | ICD-10-CM

## 2012-06-17 DIAGNOSIS — C50919 Malignant neoplasm of unspecified site of unspecified female breast: Secondary | ICD-10-CM | POA: Diagnosis not present

## 2012-06-17 DIAGNOSIS — N6001 Solitary cyst of right breast: Secondary | ICD-10-CM

## 2012-06-17 DIAGNOSIS — Z170 Estrogen receptor positive status [ER+]: Secondary | ICD-10-CM | POA: Diagnosis not present

## 2012-06-17 DIAGNOSIS — Z853 Personal history of malignant neoplasm of breast: Secondary | ICD-10-CM

## 2012-06-17 DIAGNOSIS — C50912 Malignant neoplasm of unspecified site of left female breast: Secondary | ICD-10-CM

## 2012-06-17 LAB — COMPREHENSIVE METABOLIC PANEL (CC13)
ALT: 13 U/L (ref 0–55)
AST: 16 U/L (ref 5–34)
Albumin: 3.6 g/dL (ref 3.5–5.0)
Alkaline Phosphatase: 83 U/L (ref 40–150)
BUN: 20.3 mg/dL (ref 7.0–26.0)
CO2: 26 mEq/L (ref 22–29)
Calcium: 9.4 mg/dL (ref 8.4–10.4)
Chloride: 105 mEq/L (ref 98–107)
Creatinine: 1.2 mg/dL — ABNORMAL HIGH (ref 0.6–1.1)
Glucose: 89 mg/dl (ref 70–99)
Potassium: 4.1 mEq/L (ref 3.5–5.1)
Sodium: 140 mEq/L (ref 136–145)
Total Bilirubin: 0.52 mg/dL (ref 0.20–1.20)
Total Protein: 7.1 g/dL (ref 6.4–8.3)

## 2012-06-17 LAB — CBC WITH DIFFERENTIAL/PLATELET
BASO%: 0.7 % (ref 0.0–2.0)
Basophils Absolute: 0 10*3/uL (ref 0.0–0.1)
EOS%: 1 % (ref 0.0–7.0)
Eosinophils Absolute: 0.1 10*3/uL (ref 0.0–0.5)
HCT: 35.4 % (ref 34.8–46.6)
HGB: 11.8 g/dL (ref 11.6–15.9)
LYMPH%: 23.1 % (ref 14.0–49.7)
MCH: 27.9 pg (ref 25.1–34.0)
MCHC: 33.2 g/dL (ref 31.5–36.0)
MCV: 84.1 fL (ref 79.5–101.0)
MONO#: 0.6 10*3/uL (ref 0.1–0.9)
MONO%: 8.8 % (ref 0.0–14.0)
NEUT#: 4.5 10*3/uL (ref 1.5–6.5)
NEUT%: 66.4 % (ref 38.4–76.8)
Platelets: 215 10*3/uL (ref 145–400)
RBC: 4.21 10*6/uL (ref 3.70–5.45)
RDW: 16 % — ABNORMAL HIGH (ref 11.2–14.5)
WBC: 6.8 10*3/uL (ref 3.9–10.3)
lymph#: 1.6 10*3/uL (ref 0.9–3.3)

## 2012-06-17 NOTE — Progress Notes (Signed)
ID: Elizabeth [name]   DOB: October 08, 1945  MR#: [id number]  CSN#:[id number]  PCP: Jackie Plum, MD GYN:  SU:  OTHER MD:   HISTORY OF PRESENT ILLNESS: The patient had been admitted to the [facility], she tells me, with a diagnosis of depression.  (I do not have those records.) While an in-patient, she says she noticed a lump in her left breast and brought it to medical attention.  Accordingly, on November 7th, 2011, she had bilateral digital mammography and left breast ultrasonography at the [facility].  In the left breast, at the site of a palpable lump, there was a focal density with ill-defined margins measuring approximately 2.1 cm.  By ultrasonography, this was a hypoechoic solid lesion measuring 1.9 cm with posterior acoustic shadowing and irregular margins.  Biopsy of the breast mass was recommended and performed on December 5th, 2011.  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  Hercept test was 1+ indicating no amplification.  The MIB-1 was borderline at 21.    With this information, the patient was referred to Dr. Micah Noel and on February 04, 2010, he proceeded to left sentinel lymph node sampling, this being positive for a micrometastatic deposit.  He then proceeded to left simple mastectomy with full axillary dissection.  An additional 10 lymph nodes were negative with tumor in this report ([id number]) measured 2.4 cm was described as Grade 2.  Margins were adequate, the closest being 0.5 cm.  There was no evidence of angiolymphatic invasion.  Her subsequent history is as detailed below.  INTERVAL HISTORY: The patient returns today with her daughter for followup of Elizabeth [name]'s left breast cancer. She continues on letrozole which she is tolerating well. She has only occasional hot flashes, nothing problematic. She tends to be mildly constipated but  manages to have a bowel movement at least every other day.  Interval history is remarkable for spotting having recently had a "stomach virus" with some nausea and emesis. This has now resolved.   Interval history is also remarkable for a screening right mammogram in January which showed an abnormality. This was then evaluated with a right diagnostic mammogram and ultrasound on 04/10/2012 which showed a "likely benign trilobed complex cyst".    REVIEW OF SYSTEMS: Elizabeth [name] denies any fevers or chills. She's had no skin changes or rashes, and denies any abnormal bruising or bleeding. Her energy level is good. She exercises on a regular basis. She does have some mild shortness of breath with exertion which is unchanged. She denies any cough, chest pain, palpitations. She's eating and drinking well with no nausea or emesis. She tells me she had a headache approximately 2 weeks ago (just prior to the "stomach virus") but this resolved, and she's had no additional abnormal headaches since that time. She denies any dizziness or change in vision. She's had no unusual myalgias, arthralgias, or bony pain, and denies any peripheral swelling.  A detailed review of systems is otherwise stable and noncontributory.   PAST MEDICAL HISTORY: Past Medical History  Diagnosis Date  . Breast CA     (Lt) breast ca dx 02/2010  . Hypertension   . Breast cancer 03/31/2011  Significant for history of depression + or - psychosis. I do not have a full psychiatric history and no documentation from her primary care physician at this point.  There is a history of high blood pressure, GERD, history of palpitation and  history of seasonal allergies.  The patient is status post tonsillectomy and adenoidectomy.    PAST SURGICAL HISTORY: No past surgical history on file.  FAMILY HISTORY No family history on file. The patient's father died from lung cancer at the age of 58.  He was a smoker.  The patient's mother died from lung  cancer as well at the age of 68.  She did not smoke but of course was exposed to secondhand smoke and also worked in a tobacco factory.  The patient had 5 brothers and 7 sisters.  Two of the sisters had breast cancer, one diagnosed at the age of 50.  The second sister, the patient does not know the age of diagnosis.  There is no history of ovarian cancer in the family and no other history of breast cancer to our knowledge.    GYNECOLOGIC HISTORY: She is GX P2, first pregnancy to term in her twenties.  She went through the change of life in her late 27s.  She never took hormone replacement.  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  She is divorced and lives alone in a senior citizen complex. This is not an assisted living situation.  She does her own medications, cooking, and all of her self care, but there are many group activities and she participates in them.  Her daughter, Elizabeth [name], directs marketing for a transportation company locally here in [place].  Elizabeth [name] has a daughter, age 47.  The patient's other child, a son, has died.  The patient is not a church attender at present.     ADVANCED DIRECTIVES: in place  HEALTH MAINTENANCE: History  Substance Use Topics  . Smoking status: Never Smoker   . Smokeless tobacco: Never Used  . Alcohol Use: No     Colonoscopy:  PAP:  Bone density:  Lipid panel:  No Known Allergies  Current Outpatient Prescriptions  Medication Sig Dispense Refill  . acetaminophen (TYLENOL) 325 MG tablet Take 650 mg by mouth every 6 (six) hours as needed.      Marland Kitchen amoxicillin (AMOXIL) 500 MG capsule       . aspirin 325 MG tablet Take 325 mg by mouth daily.      . clarithromycin (BIAXIN) 500 MG tablet       . clobetasol cream (TEMOVATE) 0.05 %       . CRESTOR 5 MG tablet       . cyanocobalamin 1000 MCG tablet Take 1,000 mcg by mouth daily.      Marland Kitchen docusate sodium  (COLACE) 100 MG capsule Take 100 mg by mouth daily as needed.      . fluticasone (FLONASE) 50 MCG/ACT nasal spray       . furosemide (LASIX) 40 MG tablet       . letrozole (FEMARA) 2.5 MG tablet Take 1 tablet (2.5 mg total) by mouth daily.  90 tablet  12  . lisinopril (PRINIVIL,ZESTRIL) 30 MG tablet       . mirtazapine (REMERON) 15 MG tablet Take 15 mg by mouth at bedtime.      Marland Kitchen omeprazole (PRILOSEC) 40 MG capsule       . pravastatin (PRAVACHOL) 20 MG tablet Take 20 mg by mouth daily.      . promethazine-phenylephrine (PROMETHAZINE-PHENYLEPHRINE) 6.25-5 MG/5ML SYRP Take 5 mLs by mouth 3 (three) times daily as needed.      . psyllium (METAMUCIL)  58.6 % packet Take 1 packet by mouth as needed.      Marland Kitchen spironolactone (ALDACTONE) 25 MG tablet        No current facility-administered medications for this visit.    OBJECTIVE: Middle-aged [place] American woman who appears well Filed Vitals:   06/17/12 1104  BP: 135/72  Pulse: 73  Temp: 97.7 F (36.5 C)  Resp: 20     Body mass index is 31.71 kg/(m^2).    ECOG FS: 0 Filed Weights   06/17/12 1104  Weight: 157 lb 1.6 oz (71.26 kg)   Sclerae unicteric Oropharynx clear No cervical or supraclavicular adenopathy Lungs clear to auscultation bilaterally, no rales or rhonchi Heart regular rate and rhythm Abdomen is soft, nontender to palpation with positive bowel sounds MSK no focal spinal tenderness to palpation, no peripheral edema Neuro: nonfocal, well oriented Breasts: The right breast is unremarkable. Patient status post left mastectomy with no skin changes and no evidence of local recurrence. Axillae are benign bilaterally with no palpable adenopathy.   LAB RESULTS: Lab Results  Component Value Date   WBC 6.8 06/17/2012   NEUTROABS 4.5 06/17/2012   HGB 11.8 06/17/2012   HCT 35.4 06/17/2012   MCV 84.1 06/17/2012   PLT 215 06/17/2012      Chemistry      Component Value Date/Time   NA 140 06/17/2012 1055   NA 139 03/28/2011 1408   K  4.1 06/17/2012 1055   K 4.2 03/28/2011 1408   CL 105 06/17/2012 1055   CL 104 03/28/2011 1408   CO2 26 06/17/2012 1055   CO2 25 03/28/2011 1408   BUN 20.3 06/17/2012 1055   BUN 24* 03/28/2011 1408   CREATININE 1.2* 06/17/2012 1055   CREATININE 0.94 03/28/2011 1408      Component Value Date/Time   CALCIUM 9.4 06/17/2012 1055   CALCIUM 9.4 03/28/2011 1408   ALKPHOS 83 06/17/2012 1055   ALKPHOS 90 03/28/2011 1408   AST 16 06/17/2012 1055   AST 18 03/28/2011 1408   ALT 13 06/17/2012 1055   ALT 16 03/28/2011 1408   BILITOT 0.52 06/17/2012 1055   BILITOT 0.3 03/28/2011 1408       Lab Results  Component Value Date   LABCA2 23 12/18/2011     STUDIES:  04/10/2012 *RADIOLOGY REPORT*  Clinical Data: Called back from screening mammography, possible  right breast mass visualized only in the CC view. Prior history of  malignant left mastectomy in 2011.  DIGITAL DIAGNOSTIC RIGHT MAMMOGRAM WITH CAD AND RIGHT BREAST  ULTRASOUND:  Comparison: Mammography 03/29/2012, 01/27/2011, 01/10/2010. No  prior ultrasound.  Findings:  ACR Breast Density Category 2: There is a scattered fibroglandular  pattern.  Spot compression CC view of the area of concern in the central  right breast, middle third, and a true lateral view of the right  breast were obtained. There is a vague density on the true lateral  view, but a discrete mass was not visible on the spot compression  CC view.  Mammographic images were processed with CAD.  On physical exam, I palpate no mass in the right breast.  Ultrasound is performed, showing a trilobed cyst at the 1 o'clock  position of the right breast, approximately 6 cm from the nipple,  measuring approximately 0.3 x 0.4 x 0.8 cm, with scattered internal  echoes. No associated solid component. No suspicious solid mass  or abnormal acoustic shadowing was identified in the right breast.  IMPRESSION:  Likely benign trilobed complex cyst  at the 1 o'clock position of  the right  breast, 6 cm from the nipple.  RECOMMENDATION:  Diagnostic right mammogram and ultrasound in 6 months to confirm  stability.  The patient was encouraged to perform monthly self breast  examination and communicate any changes with her primary physician.  I have discussed the findings and recommendations with the patient.  Results were also provided in writing at the conclusion of the  visit.  BI-RADS CATEGORY 3: Probably benign finding(s) - short interval  follow-up suggested.  Original Report Authenticated By: Hulan Saas, M.D.     03/29/2012 *RADIOLOGY REPORT*  Clinical data: Screening.  MAMMOGRAPHIC UNILATERAL RIGHT DIGITAL SCREENING WITH CAD  Comparison: Compared prior films  FINDINGS:  ACR Breast Density Category 3:The breast tissue is heterogeneously  dense.  In the right breast, a possible mass warrants further evaluation  with spot compression views and possibly ultrasound. In the left  breast, no masses or malignant type calcifications are identified.  Images were processed with CAD.  IMPRESSION:  Further evaluation is suggested for possible mass in the right  breast.  RECOMMENDATION:  Diagnostic mammogram and possibly ultrasound of the right breast.  (Code:[id number]-[id number])  The patient will be contacted regarding the findings, and  additional imaging will be scheduled.  BI-RADS CATEGORY 0: Incomplete. Need additional imaging  evaluation and/or prior mammograms for comparison.  Original Report Authenticated By: Sherian Rein, M.D.     ASSESSMENT: 67 y.o. [place] woman   (1)  status post left mastectomy and axillary lymph node dissection December 2011 for a T2 N16mic, stage IIA, invasive ductal carcinoma, grade 2.  Strongly ER/PR positive, HER2/neu negative with a borderline MIB-1.    (2)  Decided to forego chemotherapy and did not need postmastectomy radiation.    (3)  Accordingly, was started on letrozole in January 2012  PLAN:  Lashannon appears to be doing  well with regards to her breast cancer, with no clinical evidence of disease recurrence. She will continue on the letrozole as before, 2.5 mg daily. I am ordering a short term six-month followup right diagnostic mammogram with ultrasound in August to evaluate the appearance cyst noted on recent studies in February. She'll then return to see Dr. Darnelle Catalan in October for routine labs and physical exam.  Mirinda and her daughter both voice understanding and agreement with this plan, and will call with any changes or problems.   She is tolerating the letrozole well, and I am making no changes in her overall treatment plan. She'll see [place] again in 6 months. We will try to complete a total of 5 years of letrozole before discharging her from followup.   BERRY,AMY    06/17/2012

## 2012-06-17 NOTE — Telephone Encounter (Signed)
appts made and printed...td 

## 2012-06-24 DIAGNOSIS — R059 Cough, unspecified: Secondary | ICD-10-CM | POA: Diagnosis not present

## 2012-06-24 DIAGNOSIS — G4710 Hypersomnia, unspecified: Secondary | ICD-10-CM | POA: Diagnosis not present

## 2012-06-24 DIAGNOSIS — J309 Allergic rhinitis, unspecified: Secondary | ICD-10-CM | POA: Diagnosis not present

## 2012-07-15 ENCOUNTER — Other Ambulatory Visit: Payer: Self-pay | Admitting: *Deleted

## 2012-07-15 DIAGNOSIS — C50919 Malignant neoplasm of unspecified site of unspecified female breast: Secondary | ICD-10-CM

## 2012-07-15 DIAGNOSIS — G4710 Hypersomnia, unspecified: Secondary | ICD-10-CM | POA: Diagnosis not present

## 2012-07-15 MED ORDER — LETROZOLE 2.5 MG PO TABS: 2.5000 mg | ORAL_TABLET | Freq: Every day | ORAL | Status: DC

## 2012-07-19 DIAGNOSIS — J309 Allergic rhinitis, unspecified: Secondary | ICD-10-CM | POA: Diagnosis not present

## 2012-07-19 DIAGNOSIS — G4733 Obstructive sleep apnea (adult) (pediatric): Secondary | ICD-10-CM | POA: Diagnosis not present

## 2012-07-19 DIAGNOSIS — R059 Cough, unspecified: Secondary | ICD-10-CM | POA: Diagnosis not present

## 2012-08-01 DIAGNOSIS — Z23 Encounter for immunization: Secondary | ICD-10-CM | POA: Diagnosis not present

## 2012-08-07 DIAGNOSIS — E785 Hyperlipidemia, unspecified: Secondary | ICD-10-CM | POA: Diagnosis not present

## 2012-08-07 DIAGNOSIS — I119 Hypertensive heart disease without heart failure: Secondary | ICD-10-CM | POA: Diagnosis not present

## 2012-08-07 DIAGNOSIS — I10 Essential (primary) hypertension: Secondary | ICD-10-CM | POA: Diagnosis not present

## 2012-08-07 DIAGNOSIS — R7301 Impaired fasting glucose: Secondary | ICD-10-CM | POA: Diagnosis not present

## 2012-08-07 DIAGNOSIS — E538 Deficiency of other specified B group vitamins: Secondary | ICD-10-CM | POA: Diagnosis not present

## 2012-08-07 DIAGNOSIS — R109 Unspecified abdominal pain: Secondary | ICD-10-CM | POA: Diagnosis not present

## 2012-08-07 DIAGNOSIS — I509 Heart failure, unspecified: Secondary | ICD-10-CM | POA: Diagnosis not present

## 2012-08-07 DIAGNOSIS — E559 Vitamin D deficiency, unspecified: Secondary | ICD-10-CM | POA: Diagnosis not present

## 2012-08-09 DIAGNOSIS — G4733 Obstructive sleep apnea (adult) (pediatric): Secondary | ICD-10-CM | POA: Diagnosis not present

## 2012-08-19 ENCOUNTER — Telehealth: Payer: Self-pay | Admitting: *Deleted

## 2012-08-19 NOTE — Telephone Encounter (Signed)
Lm informing the pt that GCM will be on call 12/17/12. gv appt d/t for 12/17/12@3 :15pm. i also made pt aware that i will mail a letter/cal as well....td

## 2012-10-09 ENCOUNTER — Other Ambulatory Visit: Payer: Medicare Other

## 2012-10-16 DIAGNOSIS — I10 Essential (primary) hypertension: Secondary | ICD-10-CM | POA: Diagnosis not present

## 2012-10-16 DIAGNOSIS — E785 Hyperlipidemia, unspecified: Secondary | ICD-10-CM | POA: Diagnosis not present

## 2012-10-16 DIAGNOSIS — R7301 Impaired fasting glucose: Secondary | ICD-10-CM | POA: Diagnosis not present

## 2012-10-16 DIAGNOSIS — I739 Peripheral vascular disease, unspecified: Secondary | ICD-10-CM | POA: Diagnosis not present

## 2012-10-21 ENCOUNTER — Ambulatory Visit
Admission: RE | Admit: 2012-10-21 | Discharge: 2012-10-21 | Disposition: A | Discharge status: Home / Self Care | Payer: Medicare Other | Source: Ambulatory Visit | Attending: Physician Assistant | Admitting: Physician Assistant

## 2012-10-21 ENCOUNTER — Other Ambulatory Visit: Payer: Self-pay | Admitting: Physician Assistant

## 2012-10-21 DIAGNOSIS — N6001 Solitary cyst of right breast: Secondary | ICD-10-CM

## 2012-10-21 DIAGNOSIS — N6489 Other specified disorders of breast: Secondary | ICD-10-CM | POA: Diagnosis not present

## 2012-10-21 DIAGNOSIS — Z853 Personal history of malignant neoplasm of breast: Secondary | ICD-10-CM

## 2012-10-23 ENCOUNTER — Telehealth: Payer: Self-pay | Admitting: Oncology

## 2012-10-23 DIAGNOSIS — I2789 Other specified pulmonary heart diseases: Secondary | ICD-10-CM | POA: Diagnosis not present

## 2012-10-23 DIAGNOSIS — I10 Essential (primary) hypertension: Secondary | ICD-10-CM | POA: Diagnosis not present

## 2012-10-30 DIAGNOSIS — I119 Hypertensive heart disease without heart failure: Secondary | ICD-10-CM | POA: Diagnosis not present

## 2012-10-30 DIAGNOSIS — I509 Heart failure, unspecified: Secondary | ICD-10-CM | POA: Diagnosis not present

## 2012-10-30 DIAGNOSIS — E559 Vitamin D deficiency, unspecified: Secondary | ICD-10-CM | POA: Diagnosis not present

## 2012-10-30 DIAGNOSIS — R109 Unspecified abdominal pain: Secondary | ICD-10-CM | POA: Diagnosis not present

## 2012-10-30 DIAGNOSIS — R7301 Impaired fasting glucose: Secondary | ICD-10-CM | POA: Diagnosis not present

## 2012-10-30 DIAGNOSIS — K3189 Other diseases of stomach and duodenum: Secondary | ICD-10-CM | POA: Diagnosis not present

## 2012-10-30 DIAGNOSIS — A048 Other specified bacterial intestinal infections: Secondary | ICD-10-CM | POA: Diagnosis not present

## 2012-10-30 DIAGNOSIS — E538 Deficiency of other specified B group vitamins: Secondary | ICD-10-CM | POA: Diagnosis not present

## 2012-10-30 DIAGNOSIS — I10 Essential (primary) hypertension: Secondary | ICD-10-CM | POA: Diagnosis not present

## 2012-11-21 DIAGNOSIS — Z23 Encounter for immunization: Secondary | ICD-10-CM | POA: Diagnosis not present

## 2012-12-03 DIAGNOSIS — E559 Vitamin D deficiency, unspecified: Secondary | ICD-10-CM | POA: Diagnosis not present

## 2012-12-03 DIAGNOSIS — I10 Essential (primary) hypertension: Secondary | ICD-10-CM | POA: Diagnosis not present

## 2012-12-03 DIAGNOSIS — E785 Hyperlipidemia, unspecified: Secondary | ICD-10-CM | POA: Diagnosis not present

## 2012-12-03 DIAGNOSIS — R7301 Impaired fasting glucose: Secondary | ICD-10-CM | POA: Diagnosis not present

## 2012-12-03 DIAGNOSIS — F329 Major depressive disorder, single episode, unspecified: Secondary | ICD-10-CM | POA: Diagnosis not present

## 2012-12-03 DIAGNOSIS — I509 Heart failure, unspecified: Secondary | ICD-10-CM | POA: Diagnosis not present

## 2012-12-10 ENCOUNTER — Other Ambulatory Visit (HOSPITAL_BASED_OUTPATIENT_CLINIC_OR_DEPARTMENT_OTHER): Payer: Medicare Other | Admitting: Lab

## 2012-12-10 DIAGNOSIS — C50912 Malignant neoplasm of unspecified site of left female breast: Secondary | ICD-10-CM

## 2012-12-10 DIAGNOSIS — C50919 Malignant neoplasm of unspecified site of unspecified female breast: Secondary | ICD-10-CM

## 2012-12-10 LAB — CBC WITH DIFFERENTIAL/PLATELET
BASO%: 1 % (ref 0.0–2.0)
Basophils Absolute: 0.1 10*3/uL (ref 0.0–0.1)
EOS%: 1.2 % (ref 0.0–7.0)
Eosinophils Absolute: 0.1 10*3/uL (ref 0.0–0.5)
HCT: 36.7 % (ref 34.8–46.6)
HGB: 12.2 g/dL (ref 11.6–15.9)
LYMPH%: 28.3 % (ref 14.0–49.7)
MCH: 28.2 pg (ref 25.1–34.0)
MCHC: 33.1 g/dL (ref 31.5–36.0)
MCV: 85.3 fL (ref 79.5–101.0)
MONO#: 0.6 10*3/uL (ref 0.1–0.9)
MONO%: 7.6 % (ref 0.0–14.0)
NEUT#: 4.8 10*3/uL (ref 1.5–6.5)
NEUT%: 61.9 % (ref 38.4–76.8)
Platelets: 234 10*3/uL (ref 145–400)
RBC: 4.31 10*6/uL (ref 3.70–5.45)
RDW: 14.8 % — ABNORMAL HIGH (ref 11.2–14.5)
WBC: 7.8 10*3/uL (ref 3.9–10.3)
lymph#: 2.2 10*3/uL (ref 0.9–3.3)

## 2012-12-10 LAB — COMPREHENSIVE METABOLIC PANEL (CC13)
ALT: 16 U/L (ref 0–55)
AST: 15 U/L (ref 5–34)
Albumin: 3.9 g/dL (ref 3.5–5.0)
Alkaline Phosphatase: 85 U/L (ref 40–150)
Anion Gap: 8 mEq/L (ref 3–11)
BUN: 21.8 mg/dL (ref 7.0–26.0)
CO2: 26 mEq/L (ref 22–29)
Calcium: 9.8 mg/dL (ref 8.4–10.4)
Chloride: 108 mEq/L (ref 98–109)
Creatinine: 1.1 mg/dL (ref 0.6–1.1)
Glucose: 82 mg/dl (ref 70–140)
Potassium: 4.2 mEq/L (ref 3.5–5.1)
Sodium: 142 mEq/L (ref 136–145)
Total Bilirubin: 0.33 mg/dL (ref 0.20–1.20)
Total Protein: 7.5 g/dL (ref 6.4–8.3)

## 2012-12-17 ENCOUNTER — Ambulatory Visit: Payer: Medicare Other | Admitting: Oncology

## 2012-12-17 ENCOUNTER — Telehealth: Payer: Self-pay | Admitting: *Deleted

## 2012-12-17 ENCOUNTER — Ambulatory Visit (HOSPITAL_BASED_OUTPATIENT_CLINIC_OR_DEPARTMENT_OTHER): Payer: Medicare Other | Admitting: Physician Assistant

## 2012-12-17 ENCOUNTER — Encounter: Payer: Self-pay | Admitting: Physician Assistant

## 2012-12-17 VITALS — BP 172/69 | HR 75 | Temp 97.7°F | Resp 18 | Ht 59.0 in | Wt 159.8 lb

## 2012-12-17 DIAGNOSIS — N6001 Solitary cyst of right breast: Secondary | ICD-10-CM

## 2012-12-17 DIAGNOSIS — N6009 Solitary cyst of unspecified breast: Secondary | ICD-10-CM

## 2012-12-17 DIAGNOSIS — I10 Essential (primary) hypertension: Secondary | ICD-10-CM | POA: Insufficient documentation

## 2012-12-17 DIAGNOSIS — Z9010 Acquired absence of unspecified breast and nipple: Secondary | ICD-10-CM

## 2012-12-17 DIAGNOSIS — R110 Nausea: Secondary | ICD-10-CM

## 2012-12-17 DIAGNOSIS — Z780 Asymptomatic menopausal state: Secondary | ICD-10-CM

## 2012-12-17 DIAGNOSIS — Z170 Estrogen receptor positive status [ER+]: Secondary | ICD-10-CM

## 2012-12-17 DIAGNOSIS — C50919 Malignant neoplasm of unspecified site of unspecified female breast: Secondary | ICD-10-CM

## 2012-12-17 DIAGNOSIS — C50912 Malignant neoplasm of unspecified site of left female breast: Secondary | ICD-10-CM

## 2012-12-17 DIAGNOSIS — K219 Gastro-esophageal reflux disease without esophagitis: Secondary | ICD-10-CM

## 2012-12-17 HISTORY — DX: Essential (primary) hypertension: I10

## 2012-12-17 HISTORY — DX: Gastro-esophageal reflux disease without esophagitis: K21.9

## 2012-12-17 HISTORY — DX: Nausea: R11.0

## 2012-12-17 NOTE — Telephone Encounter (Signed)
appts made and printed...td 

## 2012-12-17 NOTE — Progress Notes (Signed)
ID: Elizabeth [name]   DOB: 1945-07-17  MR#: [id number]  CSN#:[id number]  PCP: Jackie Plum, MD GYN:  SU:  OTHER MD:   HISTORY OF PRESENT ILLNESS: The patient had been admitted to the [facility], she tells me, with a diagnosis of depression.  (I do not have those records.) While an in-patient, she says she noticed a lump in her left breast and brought it to medical attention.  Accordingly, on November 7th, 2011, she had bilateral digital mammography and left breast ultrasonography at the [facility].  In the left breast, at the site of a palpable lump, there was a focal density with ill-defined margins measuring approximately 2.1 cm.  By ultrasonography, this was a hypoechoic solid lesion measuring 1.9 cm with posterior acoustic shadowing and irregular margins.  Biopsy of the breast mass was recommended and performed on December 5th, 2011.  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  Hercept test was 1+ indicating no amplification.  The MIB-1 was borderline at 21.    With this information, the patient was referred to Dr. Micah Noel and on February 04, 2010, he proceeded to left sentinel lymph node sampling, this being positive for a micrometastatic deposit.  He then proceeded to left simple mastectomy with full axillary dissection.  An additional 10 lymph nodes were negative with tumor in this report ([id number]) measured 2.4 cm was described as Grade 2.  Margins were adequate, the closest being 0.5 cm.  There was no evidence of angiolymphatic invasion.  Her subsequent history is as detailed below.  INTERVAL HISTORY: The patient returns today with her daughter Elizabeth [name] for followup of [name]'s left breast cancer. She continues on letrozole in tolerance. She does have some hot flashes which she does not find particularly problematic. She's had no vaginal dryness.  She denies any increased joint pain.  Interval history is notable for [name] having short term followup mammograms of the right breast every 6 months to follow a well-defined probable minimally complicated cyst in the right breast. Most recent mammogram and ultrasound was obtained on 10/21/2012, with results detailed below. Once again, it was suggested that this be repeated again in 6 months.  REVIEW OF SYSTEMS: Lue denies any recent illnesses and has had no fevers or chills. She's had no skin changes or rashes, and denies any abnormal bruising or bleeding. Her energy level is fairly good as is her appetite. She continues to have some occasional nausea but no emesis, and also complains of reflux symptoms. These are followed by Dr. Julio Sicks.  She has antinausea meds to use if needed, and is also on  Prevacid daily.   Burlene continues to have some shortness of breath  with exertion which is unchanged. She denies any cough, phlegm production, orthopnea,  chest pain, or palpitations  palpitations. Sh denies any abnormal headaches, e dizziness or change in vision. She's had no unusual myalgias, arthralgias, or bony pain, and denies any peripheral swelling.  A detailed review of systems is otherwise stable and noncontributory.   PAST MEDICAL HISTORY: Past Medical History  Diagnosis Date  . Breast CA     (Lt) breast ca dx 02/2010  . Hypertension   . Breast cancer 03/31/2011  . Nausea alone 12/17/2012  . GERD (gastroesophageal reflux disease) 12/17/2012  . Hypertension 12/17/2012  Significant for history of depression + or - psychosis. I do not have a full psychiatric history and no documentation  from her primary care physician at this point.  There is a history of high blood pressure, GERD, history of palpitation and history of seasonal allergies.  The patient is status post tonsillectomy and adenoidectomy.    PAST SURGICAL HISTORY: History reviewed. No pertinent past surgical  history.  FAMILY HISTORY History reviewed. No pertinent family history. The patient's father died from lung cancer at the age of 58.  He was a smoker.  The patient's mother died from lung cancer as well at the age of 55.  She did not smoke but of course was exposed to secondhand smoke and also worked in a tobacco factory.  The patient had 5 brothers and 7 sisters.  Two of the sisters had breast cancer, one diagnosed at the age of 88.  The second sister, the patient does not know the age of diagnosis.  There is no history of ovarian cancer in the family and no other history of breast cancer to our knowledge.    GYNECOLOGIC HISTORY: She is GX P2, first pregnancy to term in her twenties.  She went through the change of life in her late 38s.  She never took hormone replacement.  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  She is divorced and lives alone in a senior citizen complex. This is not an assisted living situation.  She does her own medications, cooking, and all of her self care, but there are many group activities and she participates in them.  Her daughter, [name], directs marketing for a transportation company locally here in [place].  Elizabeth [name] has a daughter who is a Holiday representative in high school.  The patient's other child, a son, has died.  The patient is not a church attender at present.     ADVANCED DIRECTIVES: in place  HEALTH MAINTENANCE: (updated 12/17/2012) History  Substance Use Topics  . Smoking status: Never Smoker   . Smokeless tobacco: Never Used  . Alcohol Use: No     Colonoscopy: Approx 2012, Dr. Loreta Ave  PAP:  Bone density: Feb 2012, Normal  Lipid panel:    No Known Allergies   Current Outpatient Prescriptions  Medication Sig Dispense Refill  . aspirin 325 MG tablet Take 325 mg by mouth daily.      . cholecalciferol (VITAMIN D) 400 UNITS TABS tablet Take  5,000 Units by mouth daily.      . clobetasol cream (TEMOVATE) 0.05 %       . cyanocobalamin 1000 MCG tablet Take 1,000 mcg by mouth daily.      . fluticasone (FLONASE) 50 MCG/ACT nasal spray       . lansoprazole (PREVACID) 30 MG capsule       . letrozole (FEMARA) 2.5 MG tablet Take 1 tablet (2.5 mg total) by mouth daily.  30 tablet  12  . lisinopril (PRINIVIL,ZESTRIL) 30 MG tablet       . ondansetron (ZOFRAN) 4 MG tablet       . promethazine-phenylephrine (PROMETHAZINE-PHENYLEPHRINE) 6.25-5 MG/5ML SYRP Take 5 mLs by mouth 3 (three) times daily as needed.      . psyllium (METAMUCIL) 58.6 % packet Take 1 packet by mouth as needed.      Marland Kitchen spironolactone (ALDACTONE) 25 MG tablet       . acetaminophen (TYLENOL) 325 MG tablet Take 650 mg by mouth every 6 (six) hours as needed.      Marland Kitchen  docusate sodium (COLACE) 100 MG capsule Take 100 mg by mouth daily as needed.      . mirtazapine (REMERON) 15 MG tablet Take 15 mg by mouth at bedtime.       No current facility-administered medications for this visit.    OBJECTIVE: Middle-aged [place] American woman who appears well and is in no acute distress Filed Vitals:   12/17/12 1517  BP: 172/69  Pulse: 75  Temp: 97.7 F (36.5 C)  Resp: 18     Body mass index is 32.26 kg/(m^2).    ECOG FS: 0 Filed Weights   12/17/12 1517  Weight: 159 lb 12.8 oz (72.485 kg)   Physical Exam: HEENT:  Sclerae anicteric.  Oropharynx clear.  NODES:  No cervical or supraclavicular lymphadenopathy palpated.  BREAST EXAM:  Right breast is unremarkable, with no palpable masses or nodularity. Patient status post left mastectomy with no nodularity, skin changes, or evidence of local recurrence. Axillae are benign bilaterally for palpable adenopathy. LUNGS:  Clear to auscultation bilaterally.  No wheezes or rhonchi HEART:  Regular rate and rhythm. ABDOMEN:  Soft, nontender.  Positive bowel sounds.  MSK:  No focal spinal tenderness to palpation. Good range of motion in the  upper extremities. EXTREMITIES:  No peripheral edema.   NEURO:  Nonfocal. Well oriented.  Reserved affect.    LAB RESULTS: Lab Results  Component Value Date   WBC 7.8 12/10/2012   NEUTROABS 4.8 12/10/2012   HGB 12.2 12/10/2012   HCT 36.7 12/10/2012   MCV 85.3 12/10/2012   PLT 234 12/10/2012      Chemistry      Component Value Date/Time   NA 142 12/10/2012 1551   NA 139 03/28/2011 1408   K 4.2 12/10/2012 1551   K 4.2 03/28/2011 1408   CL 105 06/17/2012 1055   CL 104 03/28/2011 1408   CO2 26 12/10/2012 1551   CO2 25 03/28/2011 1408   BUN 21.8 12/10/2012 1551   BUN 24* 03/28/2011 1408   CREATININE 1.1 12/10/2012 1551   CREATININE 0.94 03/28/2011 1408      Component Value Date/Time   CALCIUM 9.8 12/10/2012 1551   CALCIUM 9.4 03/28/2011 1408   ALKPHOS 85 12/10/2012 1551   ALKPHOS 90 03/28/2011 1408   AST 15 12/10/2012 1551   AST 18 03/28/2011 1408   ALT 16 12/10/2012 1551   ALT 16 03/28/2011 1408   BILITOT 0.33 12/10/2012 1551   BILITOT 0.3 03/28/2011 1408       Lab Results  Component Value Date   LABCA2 23 12/18/2011     STUDIES:  Most recent bone density in February 2012 was normal.   MM Digital Diagnostic Unilat R / US Breast Right  10/21/2012   *RADIOLOGY REPORT*  Clinical Data: Patient presents for a 76-month follow-up diagnostic  evaluation of the right breast.  DIGITAL DIAGNOSTIC RIGHT MAMMOGRAM WITH CAD AND RIGHT BREAST  ULTRASOUND:  Comparison: 03/29/2012, 04/10/2012, 01/27/2011  Findings:  ACR Breast Density Category b: There are scattered areas of  fibroglandular density.  Exam demonstrates no change in a small asymmetry over the middle  third of the slightly outer right breast. Remainder of the exam is  unchanged.  Mammographic images were processed with CAD.  Ultrasound is performed, showing no focal abnormality over the 1  o'clock position of the right breast 6 cm from the nipple as was  described on the prior exam. The 11 o'clock position of the right  breast  6 cm from the nipple  was also scanned and demonstrates no  focal abnormality. Note that the previously described probable  complicated cyst at the 1 o'clock position of the right breast 6 cm  from the nipple is best seen on today's exam at the 2 o'clock  position of the right breast approximately 3 cm from the nipple as  this likely corresponds to the same abnormality. This measures 3 x  6 x 7 mm and is not significantly changed sonographically. This  likely represents a minimally complicated cyst.  IMPRESSION:  Stable mammogram. Small well-defined probable minimally  complicated cyst at the 2 o'clock position of the right breast 3 cm  from the nipple measuring 3 x 6 x 7 mm.  RECOMMENDATION:  Recommend an additional 4-month follow-up diagnostic right breast  mammogram and ultrasound which will coincide with the patient's  annual exam in January 2015.  I have discussed the findings and recommendations with the patient.  Results were also provided in writing at the conclusion of the  visit. If applicable, a reminder letter will be sent to the  patient regarding the next appointment.  BI-RADS CATEGORY 3: Probably benign finding(s) - short interval  follow-up suggested.  Original Report Authenticated By: Elberta Fortis, M.D.      ASSESSMENT: 67 y.o. [place] woman   (1)  status post left mastectomy and axillary lymph node dissection December 2011 for a T2 N32mic, stage IIA, invasive ductal carcinoma, grade 2.  Strongly ER/PR positive, HER2/neu negative with a borderline MIB-1.    (2)  Decided to forego chemotherapy and did not need postmastectomy radiation.    (3)  Accordingly, was started on letrozole in January 2012, the goal being to continue for total of 5 years (January 2017).   PLAN:  Jodie is doing well and  is tolerating the letrozole well, with no clinical evidence of disease recurrence at this time. I make no changes to her current regimen, and she will continue on the  letrozole as before. Again, our plan is to continue to treat for a total of 5 years, after which she will likely be discharged from followup. (This would be in January 2017.)  We'll continue to follow her very closely with right mammograms and right ultrasound as recommended by the radiologist to follow the apparent cyst in the right breast. Her next mammogram and ultrasound has already been scheduled for late January 2015. She will return to see [place] in 6 months for routine followup with labs and physical exam in February 2015 to review those results.  She's also due for repeat bone density, and this will be ordered for next available appointment at the [facility].  Kynzie and her daughter both voice understanding and agreement with this plan, and will call with any changes or problems.   BERRY,AMY    12/17/2012

## 2012-12-18 ENCOUNTER — Telehealth: Payer: Self-pay | Admitting: *Deleted

## 2012-12-18 NOTE — Telephone Encounter (Signed)
sw pt gv appt for bone dexa for 01/16/13 @ 2pm. Pt is aware...td

## 2013-01-16 ENCOUNTER — Other Ambulatory Visit: Payer: Medicare Other

## 2013-02-19 ENCOUNTER — Ambulatory Visit
Admission: RE | Admit: 2013-02-19 | Discharge: 2013-02-19 | Disposition: A | Discharge status: Home / Self Care | Payer: Medicare Other | Source: Ambulatory Visit | Attending: Physician Assistant | Admitting: Physician Assistant

## 2013-02-19 DIAGNOSIS — Z780 Asymptomatic menopausal state: Secondary | ICD-10-CM

## 2013-02-19 DIAGNOSIS — C50912 Malignant neoplasm of unspecified site of left female breast: Secondary | ICD-10-CM

## 2013-02-25 ENCOUNTER — Telehealth: Payer: Self-pay | Admitting: *Deleted

## 2013-02-25 NOTE — Telephone Encounter (Signed)
sw pt informed her that [facility] will be on pal 04/21/13. gv appt for her to see AGB on 04/21/13@ 2:45pm. Pt is aware...td

## 2013-04-02 ENCOUNTER — Other Ambulatory Visit: Payer: Medicare Other

## 2013-04-11 ENCOUNTER — Emergency Department (HOSPITAL_COMMUNITY)
Admission: EM | Admit: 2013-04-11 | Discharge: 2013-04-13 | Disposition: A | Discharge status: Other Acute Inpatient Hospital | Payer: Medicare Other | Attending: Emergency Medicine | Admitting: Emergency Medicine

## 2013-04-11 ENCOUNTER — Encounter (HOSPITAL_COMMUNITY): Payer: Self-pay | Admitting: Emergency Medicine

## 2013-04-11 ENCOUNTER — Emergency Department (HOSPITAL_COMMUNITY): Payer: Medicare Other

## 2013-04-11 DIAGNOSIS — F3289 Other specified depressive episodes: Secondary | ICD-10-CM | POA: Diagnosis not present

## 2013-04-11 DIAGNOSIS — R079 Chest pain, unspecified: Secondary | ICD-10-CM | POA: Diagnosis not present

## 2013-04-11 DIAGNOSIS — IMO0002 Reserved for concepts with insufficient information to code with codable children: Secondary | ICD-10-CM | POA: Diagnosis not present

## 2013-04-11 DIAGNOSIS — F209 Schizophrenia, unspecified: Secondary | ICD-10-CM | POA: Insufficient documentation

## 2013-04-11 DIAGNOSIS — K625 Hemorrhage of anus and rectum: Secondary | ICD-10-CM | POA: Insufficient documentation

## 2013-04-11 DIAGNOSIS — F329 Major depressive disorder, single episode, unspecified: Secondary | ICD-10-CM | POA: Insufficient documentation

## 2013-04-11 DIAGNOSIS — Z79899 Other long term (current) drug therapy: Secondary | ICD-10-CM | POA: Insufficient documentation

## 2013-04-11 DIAGNOSIS — Z853 Personal history of malignant neoplasm of breast: Secondary | ICD-10-CM | POA: Insufficient documentation

## 2013-04-11 DIAGNOSIS — F29 Unspecified psychosis not due to a substance or known physiological condition: Secondary | ICD-10-CM | POA: Diagnosis not present

## 2013-04-11 DIAGNOSIS — K644 Residual hemorrhoidal skin tags: Secondary | ICD-10-CM | POA: Insufficient documentation

## 2013-04-11 DIAGNOSIS — K921 Melena: Secondary | ICD-10-CM | POA: Insufficient documentation

## 2013-04-11 DIAGNOSIS — I10 Essential (primary) hypertension: Secondary | ICD-10-CM | POA: Insufficient documentation

## 2013-04-11 DIAGNOSIS — Z7982 Long term (current) use of aspirin: Secondary | ICD-10-CM | POA: Diagnosis not present

## 2013-04-11 DIAGNOSIS — K219 Gastro-esophageal reflux disease without esophagitis: Secondary | ICD-10-CM | POA: Insufficient documentation

## 2013-04-11 HISTORY — DX: Major depressive disorder, single episode, unspecified: F32.9

## 2013-04-11 HISTORY — DX: Anxiety disorder, unspecified: F41.9

## 2013-04-11 HISTORY — DX: Depression, unspecified: F32.A

## 2013-04-11 HISTORY — DX: Schizophrenia, unspecified: F20.9

## 2013-04-11 LAB — COMPREHENSIVE METABOLIC PANEL
ALT: 22 U/L (ref 0–35)
AST: 22 U/L (ref 0–37)
Albumin: 4 g/dL (ref 3.5–5.2)
Alkaline Phosphatase: 83 U/L (ref 39–117)
BUN: 15 mg/dL (ref 6–23)
CO2: 27 mEq/L (ref 19–32)
Calcium: 9.4 mg/dL (ref 8.4–10.5)
Chloride: 103 mEq/L (ref 96–112)
Creatinine, Ser: 0.82 mg/dL (ref 0.50–1.10)
GFR calc Af Amer: 83 mL/min — ABNORMAL LOW (ref >90)
GFR calc non Af Amer: 72 mL/min — ABNORMAL LOW (ref >90)
Glucose, Bld: 124 mg/dL — ABNORMAL HIGH (ref 70–99)
Potassium: 4.1 mEq/L (ref 3.7–5.3)
Sodium: 140 mEq/L (ref 137–147)
Total Bilirubin: <0.2 mg/dL — ABNORMAL LOW (ref 0.3–1.2)
Total Protein: 7.1 g/dL (ref 6.0–8.3)

## 2013-04-11 LAB — URINALYSIS, ROUTINE W REFLEX MICROSCOPIC
Bilirubin Urine: NEGATIVE
Glucose, UA: NEGATIVE mg/dL
Hgb urine dipstick: NEGATIVE
Ketones, ur: NEGATIVE mg/dL
Leukocytes, UA: NEGATIVE
Nitrite: NEGATIVE
Protein, ur: NEGATIVE mg/dL
Specific Gravity, Urine: 1.007 (ref 1.005–1.030)
Urobilinogen, UA: 0.2 mg/dL (ref 0.0–1.0)
pH: 6.5 (ref 5.0–8.0)

## 2013-04-11 LAB — CBC
HCT: 38.6 % (ref 36.0–46.0)
Hemoglobin: 12.8 g/dL (ref 12.0–15.0)
MCH: 28.6 pg (ref 26.0–34.0)
MCHC: 33.2 g/dL (ref 30.0–36.0)
MCV: 86.2 fL (ref 78.0–100.0)
Platelets: 241 10*3/uL (ref 150–400)
RBC: 4.48 MIL/uL (ref 3.87–5.11)
RDW: 13.3 % (ref 11.5–15.5)
WBC: 6.2 10*3/uL (ref 4.0–10.5)

## 2013-04-11 LAB — RAPID URINE DRUG SCREEN, HOSP PERFORMED
Amphetamines: NOT DETECTED
Barbiturates: NOT DETECTED
Benzodiazepines: NOT DETECTED
Cocaine: NOT DETECTED
Opiates: NOT DETECTED
Tetrahydrocannabinol: NOT DETECTED

## 2013-04-11 LAB — ETHANOL: Alcohol, Ethyl (B): <11 mg/dL (ref 0–11)

## 2013-04-11 MED ORDER — LORAZEPAM 1 MG PO TABS: 1.0000 mg | ORAL_TABLET | Freq: Three times a day (TID) | ORAL | Status: DC | PRN

## 2013-04-11 MED ORDER — CLOBETASOL PROPIONATE 0.05 % EX CREA
1.0000 "application " | TOPICAL_CREAM | Freq: Two times a day (BID) | CUTANEOUS | Status: DC
  Administered 2013-04-12 – 2013-04-13 (×3): 1 via TOPICAL
  Filled 2013-04-11: qty 15

## 2013-04-11 MED ORDER — IBUPROFEN 200 MG PO TABS: 600.0000 mg | ORAL_TABLET | Freq: Three times a day (TID) | ORAL | Status: DC | PRN

## 2013-04-11 MED ORDER — ASPIRIN 325 MG PO TABS
325.0000 mg | ORAL_TABLET | Freq: Every day | ORAL | Status: DC
  Administered 2013-04-12 – 2013-04-13 (×2): 325 mg via ORAL
  Filled 2013-04-11 (×2): qty 1

## 2013-04-11 MED ORDER — ACETAMINOPHEN 325 MG PO TABS: 650.0000 mg | ORAL_TABLET | ORAL | Status: DC | PRN

## 2013-04-11 MED ORDER — ALUM & MAG HYDROXIDE-SIMETH 200-200-20 MG/5ML PO SUSP: 30.0000 mL | ORAL | Status: DC | PRN

## 2013-04-11 MED ORDER — LISINOPRIL 20 MG PO TABS
30.0000 mg | ORAL_TABLET | Freq: Every day | ORAL | Status: DC
  Administered 2013-04-12 – 2013-04-13 (×2): 30 mg via ORAL
  Filled 2013-04-11 (×2): qty 1

## 2013-04-11 MED ORDER — ONDANSETRON HCL 4 MG PO TABS: 4.0000 mg | ORAL_TABLET | Freq: Three times a day (TID) | ORAL | Status: DC | PRN

## 2013-04-11 MED ORDER — DOCUSATE SODIUM 100 MG PO CAPS
100.0000 mg | ORAL_CAPSULE | Freq: Every day | ORAL | Status: DC | PRN
  Administered 2013-04-12 (×2): 100 mg via ORAL
  Filled 2013-04-11 (×4): qty 1

## 2013-04-11 MED ORDER — NICOTINE 21 MG/24HR TD PT24
21.0000 mg | MEDICATED_PATCH | Freq: Every day | TRANSDERMAL | Status: DC
  Filled 2013-04-11: qty 1

## 2013-04-11 NOTE — ED Provider Notes (Signed)
CSN: [id number]     Arrival date & time 04/11/13  1751 History   First MD Initiated Contact with Patient 04/11/13 1816     Chief Complaint  Patient presents with  . Rectal Bleeding  . Medical Clearance   (Consider location/radiation/quality/duration/timing/severity/associated sxs/prior Treatment) Patient is a 68 y.o. female presenting with hematochezia. The history is provided by the patient and a relative.  Rectal Bleeding  patient here complaining of increased auditory as well as visual hallucinations. Does have history of schizophrenia. Denies any suicidal or homicidal ideations. No recent illicit alcohol or drug use. She has not been totally compliant with her medications. Has had increasing outbursts of anger and has recently been evicted from her place of residence. Symptoms have been persistent and nothing makes them better or worse. Denies any severe headaches or vomiting. Patient also states that she saw blood in her stool without associated abdominal pain. No syncope or near-syncope. No prior history of GI bleeding. According to her daughter, she has not seen any blood. When asked if this could be a hallucination patient is unsure.  Past Medical History  Diagnosis Date  . Breast CA     (Lt) breast ca dx 02/2010  . Hypertension   . Breast cancer 03/31/2011  . Nausea alone 12/17/2012  . GERD (gastroesophageal reflux disease) 12/17/2012  . Hypertension 12/17/2012  . Schizophrenia   . Depression    Past Surgical History  Procedure Laterality Date  . Breast surgery    . Tonsillectomy     History reviewed. No pertinent family history. History  Substance Use Topics  . Smoking status: Never Smoker   . Smokeless tobacco: Never Used  . Alcohol Use: No   OB History   Grav Para Term Preterm Abortions TAB SAB Ect Mult Living                 Review of Systems  Gastrointestinal: Positive for hematochezia.  All other systems reviewed and are negative.    Allergies  Review of  patient's allergies indicates no known allergies.  Home Medications   Current Outpatient Rx  Name  Route  Sig  Dispense  Refill  . aspirin 325 MG tablet   Oral   Take 325 mg by mouth daily.         . bisacodyl (DULCOLAX) 5 MG EC tablet   Oral   Take 5 mg by mouth once.         . clobetasol cream (TEMOVATE) 0.05 %   Topical   Apply 1 application topically 2 (two) times daily.          Marland Kitchen docusate sodium (COLACE) 100 MG capsule   Oral   Take 100 mg by mouth daily as needed.         Marland Kitchen letrozole (FEMARA) 2.5 MG tablet   Oral   Take 1 tablet (2.5 mg total) by mouth daily.   30 tablet   12   . letrozole (FEMARA) 2.5 MG tablet   Oral   Take 2.5 mg by mouth at bedtime.         Marland Kitchen lisinopril (PRINIVIL,ZESTRIL) 30 MG tablet   Oral   Take 30 mg by mouth daily.          . Multiple Vitamin (MULTIVITAMIN WITH MINERALS) TABS tablet   Oral   Take 1 tablet by mouth daily.         . promethazine-phenylephrine (PROMETHAZINE-PHENYLEPHRINE) 6.25-5 MG/5ML SYRP   Oral   Take 5 mLs  by mouth 3 (three) times daily as needed.         . psyllium (METAMUCIL) 58.6 % packet   Oral   Take 1 packet by mouth as needed.          BP 154/67  Pulse 63  Temp(Src) 98 F (36.7 C) (Oral)  Resp 16  SpO2 100% Physical Exam  Nursing note and vitals reviewed. Constitutional: She is oriented to person, place, and time. She appears well-developed and well-nourished.  Non-toxic appearance. No distress.  HENT:  Head: Normocephalic and atraumatic.  Eyes: Conjunctivae, EOM and lids are normal. Pupils are equal, round, and reactive to light.  Neck: Normal range of motion. Neck supple. No tracheal deviation present. No mass present.  Cardiovascular: Normal rate, regular rhythm and normal heart sounds.  Exam reveals no gallop.   No murmur heard. Pulmonary/Chest: Effort normal and breath sounds normal. No stridor. No respiratory distress. She has no decreased breath sounds. She has no  wheezes. She has no rhonchi. She has no rales.  Abdominal: Soft. Normal appearance and bowel sounds are normal. She exhibits no distension. There is no tenderness. There is no rebound and no CVA tenderness.  Genitourinary: Rectal exam shows external hemorrhoid. Rectal exam shows no mass, no tenderness and anal tone normal.  No bleeding or stool visible on digital exam.  Musculoskeletal: Normal range of motion. She exhibits no edema and no tenderness.  Neurological: She is alert and oriented to person, place, and time. She has normal strength. No cranial nerve deficit or sensory deficit. GCS eye subscore is 4. GCS verbal subscore is 5. GCS motor subscore is 6.  Skin: Skin is warm and dry. No abrasion and no rash noted.  Psychiatric: Her affect is blunt. Her speech is delayed. She is slowed, withdrawn and actively hallucinating. Thought content is paranoid. She expresses no suicidal plans and no homicidal plans.    ED Course  Procedures (including critical care time) Labs Review Labs Reviewed  CBC  COMPREHENSIVE METABOLIC PANEL  URINALYSIS, ROUTINE W REFLEX MICROSCOPIC  URINE RAPID DRUG SCREEN (HOSP PERFORMED)  ETHANOL   Imaging Review No results found.  EKG Interpretation   None       MDM  No diagnosis found.  Patient is medically cleared and will be seen by [facility]   Leota Jacobsen, MD 04/11/13 2133

## 2013-04-11 NOTE — BH Assessment (Signed)
Spoke with Idelle Crouch regarding clinicals prior to  assessing patient.  Shaune Pollack, MS, [place] Assessment Counselor

## 2013-04-11 NOTE — ED Provider Notes (Signed)
Patient brought in by her daughter for disruptive behavior, hallucinations, and outburst.  Patient was recently displaced from her independent elderly living facility.  She is noncompliant with medications.  Her daughter reports she has a history of schizophrenia and paranoid delusion.  The patient was staying in an extended stay her Tylenol last night which she was the disruptive police were called.  The patient also reports she has been very constipated and recently has been passing bright red blood per with them.  She states that it is a profuse amount.  Last bowel movement was yesterday and treated for that.  Patient has a past medical history of breast cancer.  Physical exam the patient appears in no acute distress.  She has a flat affect. CV: RRR, No M/R/G, Peripheral pulses intact. No peripheral edema. Lungs: CTAB Abd: Soft, Non tender, non distended   Patient needs work up for GI bleed. Her labs are pending.   Margarita Mail, PA-C 04/11/13 1844

## 2013-04-11 NOTE — BH Assessment (Signed)
Spoke with patient's daughter Junie Bame  reports that she is patient's POA. TTS confirmed with [place] in registration that there is no electronic copy of POA on file. TTS spoke with daughter by telephone to request a copy of POA. Pt's daughter reports that she would try to find the document but believes that she gave her copy to EMS during a prior visit for her mother to [facility]. She reports that she does not believe that her copy was returned to her.   Pt's daughter is willing to sign a new POA at [facility] as needed in the presence of an onsite notary if she can't locate a copy of POA. Pt's nurse, SW, or TTS  will need to follow-up with pt and daughter regarding this matter.   Shaune Pollack, MS, [place] Assessment Counselor

## 2013-04-11 NOTE — BH Assessment (Addendum)
Pt's daughter can be reached at [phone] and she would like to be informed prior to patient transfer to a facility. Pt's daughter's name is Reunion.   Shaune Pollack, MS, [place] Assessment Counselor

## 2013-04-11 NOTE — ED Notes (Signed)
EKG given to EDP, Harrison,MD. For review.

## 2013-04-11 NOTE — BH Assessment (Signed)
Consulted with EDP Dr. Zenia Resides and Serena Colonel, NP who is recommending Gero-Psych Placement at this time. Pt is coopertative and willing to sign herself in voluntary for treatment. Pt will need to be referred to [place] with [facility] in admission at [facility] and she reports that they have bed availability  Spoke with Sharyn Lull Disposition Tech at [facility] who agreed to refer patient to [facility] for inpatient treatment. Informed EDP Dr. Zenia Resides that Elizabeth [name] is requesting an EKG and Chest Xray. Dr. Zenia Resides will place orders.  Shaune Pollack, MS, [place] Assessment Counselor

## 2013-04-11 NOTE — ED Notes (Signed)
Pt c/o constipation and rectal bleeding x 1 day.  Pt's daughter requesting Pt to have a Psych Evaluation and says that she needs medication management.  Sts Pt has increasing auditory hallucinations and increasing "rants."  Pt was kicked out of facility for being "disruptive" and has been staying in a hotel.  Police was called last night, because she was screaming.  Pt could not explain why she was screaming.

## 2013-04-12 ENCOUNTER — Encounter (HOSPITAL_COMMUNITY): Payer: Self-pay | Admitting: *Deleted

## 2013-04-12 DIAGNOSIS — F209 Schizophrenia, unspecified: Secondary | ICD-10-CM | POA: Diagnosis not present

## 2013-04-12 DIAGNOSIS — F29 Unspecified psychosis not due to a substance or known physiological condition: Secondary | ICD-10-CM | POA: Diagnosis not present

## 2013-04-12 DIAGNOSIS — R079 Chest pain, unspecified: Secondary | ICD-10-CM | POA: Diagnosis not present

## 2013-04-12 NOTE — ED Notes (Signed)
Observed talking to herself upon entering her room to give med. When asked about hearing voices stated she did hear voices but she was talking to herself. Stated the voices did not tell her to hurt herself or anyone else. Stated she was still seeing puzzles.

## 2013-04-12 NOTE — ED Notes (Signed)
Has been IVC'd by Dr.Arfeen.

## 2013-04-12 NOTE — ED Notes (Signed)
Patient denies pain and is resting comfortably.  

## 2013-04-12 NOTE — BH Assessment (Addendum)
Pt daughter is requesting that patient be transferred to [facility] if they can accept her as patient has been admitted to [facility] 2x prior. Pt's daughter reports that patient has been diagnosed with Schizophrenia during prior admission to [facility].  Shaune Pollack, MS, [place] Assessment Counselor

## 2013-04-12 NOTE — ED Notes (Signed)
Pt. Changed into blue scrubs. Pt. And belongings searched and wanded by security. Pt. Has sweat pant, shirt, underpants,bra, and shoes. Pt. Has 1 belongings bag. Pt. Bag locked up in [place] [address] in the TCU near the psy-ed.

## 2013-04-12 NOTE — ED Notes (Signed)
This is a 68 years old [place] American female admitted to the unit this morning. She endorsed feeling constipated and having rectal bleeding per ED report. Patient stated ; "I feel lousy, all my information is already in my record". She is unwilling to answer questions. Her mood and affect flat and depressed. She denied SI/HI but endorsed a/v hallucinations. " I hear voices and I see stuff like puzzles".  She denied command hallucinations and said the voices are non threatening. Writer encouraged and supported patient. Q 15 minute check initiated by staff.
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
## 2013-04-12 NOTE — ED Provider Notes (Signed)
Medical screening examination/treatment/procedure(s) were performed by non-physician practitioner and as supervising physician I was immediately available for consultation/collaboration.  Anthony T Allen, MD 04/12/13 1648 
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
## 2013-04-12 NOTE — BH Assessment (Signed)
[facility] Assessment Progress Note The Patient's Daughter Shella Spearing was contacted in reference to [facility]  Acceptance of the Patient to the gero-psych unit.  Ms. Markus Daft reported the Patient was admitted there 2 years ago and that she was appointed the [facility].  Ms. Markus Daft reported being in agreement with the Patient being transported and admitted to [facility].  IVC will be completed on the Patient.

## 2013-04-13 DIAGNOSIS — F3289 Other specified depressive episodes: Secondary | ICD-10-CM | POA: Diagnosis present

## 2013-04-13 DIAGNOSIS — F329 Major depressive disorder, single episode, unspecified: Secondary | ICD-10-CM | POA: Diagnosis present

## 2013-04-13 DIAGNOSIS — F200 Paranoid schizophrenia: Secondary | ICD-10-CM | POA: Diagnosis present

## 2013-04-13 DIAGNOSIS — K625 Hemorrhage of anus and rectum: Secondary | ICD-10-CM | POA: Diagnosis not present

## 2013-04-13 DIAGNOSIS — Z9119 Patient's noncompliance with other medical treatment and regimen: Secondary | ICD-10-CM | POA: Diagnosis not present

## 2013-04-13 DIAGNOSIS — C50919 Malignant neoplasm of unspecified site of unspecified female breast: Secondary | ICD-10-CM | POA: Diagnosis present

## 2013-04-13 DIAGNOSIS — K644 Residual hemorrhoidal skin tags: Secondary | ICD-10-CM | POA: Diagnosis not present

## 2013-04-13 DIAGNOSIS — Z91199 Patient's noncompliance with other medical treatment and regimen due to unspecified reason: Secondary | ICD-10-CM | POA: Diagnosis not present

## 2013-04-13 DIAGNOSIS — I10 Essential (primary) hypertension: Secondary | ICD-10-CM | POA: Diagnosis not present

## 2013-04-13 DIAGNOSIS — F29 Unspecified psychosis not due to a substance or known physiological condition: Secondary | ICD-10-CM | POA: Diagnosis not present

## 2013-04-13 DIAGNOSIS — K219 Gastro-esophageal reflux disease without esophagitis: Secondary | ICD-10-CM | POA: Diagnosis not present

## 2013-04-13 DIAGNOSIS — E785 Hyperlipidemia, unspecified: Secondary | ICD-10-CM | POA: Diagnosis not present

## 2013-04-13 DIAGNOSIS — Z5900 Homelessness unspecified: Secondary | ICD-10-CM | POA: Diagnosis not present

## 2013-04-13 DIAGNOSIS — K5900 Constipation, unspecified: Secondary | ICD-10-CM | POA: Diagnosis not present

## 2013-04-13 DIAGNOSIS — Z79899 Other long term (current) drug therapy: Secondary | ICD-10-CM | POA: Diagnosis not present

## 2013-04-13 DIAGNOSIS — Z7982 Long term (current) use of aspirin: Secondary | ICD-10-CM | POA: Diagnosis not present

## 2013-04-13 DIAGNOSIS — Z9010 Acquired absence of unspecified breast and nipple: Secondary | ICD-10-CM | POA: Diagnosis not present

## 2013-04-13 DIAGNOSIS — F209 Schizophrenia, unspecified: Secondary | ICD-10-CM | POA: Diagnosis not present

## 2013-04-13 DIAGNOSIS — E538 Deficiency of other specified B group vitamins: Secondary | ICD-10-CM | POA: Diagnosis present

## 2013-04-13 MED ORDER — NALOXONE HCL 1 MG/ML IJ SOLN
INTRAMUSCULAR | Status: AC
  Filled 2013-04-13: qty 2

## 2013-04-13 NOTE — ED Notes (Addendum)
Report given to Zella Richer RN at [facility]. Accepted by Dr.Jones. Awaiting Sheriffs dept to transport.

## 2013-04-13 NOTE — ED Notes (Signed)
Attempted to call report to [facility] unable to get through. Left message on machine.

## 2013-04-13 NOTE — ED Notes (Addendum)
Patient is accepted at [facility] spoke with RN Vaughan Basta [phone] or [phone]  please call report. Sheriff dept called for transportation  @ [phone] left message

## 2013-04-13 NOTE — ED Notes (Signed)
Sheriff here to transport to [facility]. Belongs bag x1 given to deputy. Ambulatory without difficulty. Denies pain. No complaints voiced. Transported to [facility] by wheelchair (for safety) escorted by mental health tech.

## 2013-04-14 ENCOUNTER — Inpatient Hospital Stay: Admission: RE | Admit: 2013-04-14 | Payer: Medicare Other | Source: Ambulatory Visit

## 2013-04-14 ENCOUNTER — Other Ambulatory Visit: Payer: Self-pay | Admitting: *Deleted

## 2013-04-14 ENCOUNTER — Other Ambulatory Visit: Payer: Medicare Other

## 2013-04-14 MED ORDER — LETROZOLE 2.5 MG PO TABS: 2.5000 mg | ORAL_TABLET | Freq: Every day | ORAL | Status: DC

## 2013-04-14 NOTE — Telephone Encounter (Signed)
Pt's daughter called stating her mother would not be able to come in to the appointment today due to she has been admitted to a [place].  Per call with daughter - she states her mother " had a very bad weekend and I had to take her [facility] where she was transferred to a facility in [place].  [name] states her mother will need a refill on her letrozole before next MD appt.  Refill given per call.

## 2013-04-16 ENCOUNTER — Telehealth: Payer: Self-pay | Admitting: Oncology

## 2013-04-16 NOTE — Telephone Encounter (Signed)
, °

## 2013-04-21 ENCOUNTER — Ambulatory Visit: Payer: Medicare Other | Admitting: Physician Assistant

## 2013-04-21 ENCOUNTER — Encounter: Payer: Self-pay | Admitting: Physician Assistant

## 2013-04-21 ENCOUNTER — Ambulatory Visit: Payer: Medicare Other | Admitting: Oncology

## 2013-04-21 NOTE — Progress Notes (Signed)
[facility] today.  Letter mailed to patient.  Micah Flesher, PA-C 04/21/2013

## 2013-06-25 ENCOUNTER — Other Ambulatory Visit (HOSPITAL_BASED_OUTPATIENT_CLINIC_OR_DEPARTMENT_OTHER): Payer: Medicare Other

## 2013-06-25 DIAGNOSIS — C50919 Malignant neoplasm of unspecified site of unspecified female breast: Secondary | ICD-10-CM

## 2013-06-25 DIAGNOSIS — C50912 Malignant neoplasm of unspecified site of left female breast: Secondary | ICD-10-CM

## 2013-06-25 LAB — COMPREHENSIVE METABOLIC PANEL (CC13)
ALT: 16 U/L (ref 0–55)
AST: 16 U/L (ref 5–34)
Albumin: 4.1 g/dL (ref 3.5–5.0)
Alkaline Phosphatase: 82 U/L (ref 40–150)
Anion Gap: 9 mEq/L (ref 3–11)
BUN: 15.5 mg/dL (ref 7.0–26.0)
CO2: 26 mEq/L (ref 22–29)
Calcium: 9.9 mg/dL (ref 8.4–10.4)
Chloride: 108 mEq/L (ref 98–109)
Creatinine: 0.9 mg/dL (ref 0.6–1.1)
Glucose: 73 mg/dl (ref 70–140)
Potassium: 4.1 mEq/L (ref 3.5–5.1)
Sodium: 142 mEq/L (ref 136–145)
Total Bilirubin: 0.39 mg/dL (ref 0.20–1.20)
Total Protein: 7.5 g/dL (ref 6.4–8.3)

## 2013-06-25 LAB — CBC WITH DIFFERENTIAL/PLATELET
BASO%: 0.8 % (ref 0.0–2.0)
Basophils Absolute: 0.1 10*3/uL (ref 0.0–0.1)
EOS%: 1.9 % (ref 0.0–7.0)
Eosinophils Absolute: 0.1 10*3/uL (ref 0.0–0.5)
HCT: 38.7 % (ref 34.8–46.6)
HGB: 12.4 g/dL (ref 11.6–15.9)
LYMPH%: 30 % (ref 14.0–49.7)
MCH: 28.5 pg (ref 25.1–34.0)
MCHC: 32.1 g/dL (ref 31.5–36.0)
MCV: 88.9 fL (ref 79.5–101.0)
MONO#: 0.5 10*3/uL (ref 0.1–0.9)
MONO%: 8.4 % (ref 0.0–14.0)
NEUT#: 3.8 10*3/uL (ref 1.5–6.5)
NEUT%: 58.9 % (ref 38.4–76.8)
Platelets: 218 10*3/uL (ref 145–400)
RBC: 4.35 10*6/uL (ref 3.70–5.45)
RDW: 15 % — ABNORMAL HIGH (ref 11.2–14.5)
WBC: 6.5 10*3/uL (ref 3.9–10.3)
lymph#: 2 10*3/uL (ref 0.9–3.3)

## 2013-07-02 ENCOUNTER — Ambulatory Visit (HOSPITAL_BASED_OUTPATIENT_CLINIC_OR_DEPARTMENT_OTHER): Payer: Medicare Other | Admitting: Physician Assistant

## 2013-07-02 ENCOUNTER — Telehealth: Payer: Self-pay | Admitting: Physician Assistant

## 2013-07-02 ENCOUNTER — Encounter: Payer: Self-pay | Admitting: Physician Assistant

## 2013-07-02 VITALS — BP 150/76 | HR 64 | Temp 97.6°F | Resp 18 | Ht 59.0 in | Wt 171.4 lb

## 2013-07-02 DIAGNOSIS — C50919 Malignant neoplasm of unspecified site of unspecified female breast: Secondary | ICD-10-CM | POA: Diagnosis not present

## 2013-07-02 DIAGNOSIS — N6009 Solitary cyst of unspecified breast: Secondary | ICD-10-CM

## 2013-07-02 DIAGNOSIS — Z853 Personal history of malignant neoplasm of breast: Secondary | ICD-10-CM

## 2013-07-02 DIAGNOSIS — Z170 Estrogen receptor positive status [ER+]: Secondary | ICD-10-CM

## 2013-07-02 DIAGNOSIS — N6001 Solitary cyst of right breast: Secondary | ICD-10-CM

## 2013-07-02 NOTE — Progress Notes (Signed)
ID: Elizabeth [name]   DOB: 14-Nov-1945  MR#: [id number]  CSN#:[id number]  PCP: Benito Mccreedy, MD GYN:  SU:  OTHER MD: Juanita Craver, MD  CHIEF COMPLAINT:  Hx of Left Breast Cancer (On letrozole)    HISTORY OF PRESENT ILLNESS: The patient had been admitted to the [facility], she tells me, with a diagnosis of depression.  (I do not have those records.) While an in-patient, she says she noticed a lump in her left breast and brought it to medical attention.  Accordingly, on November 7th, 2011, she had bilateral digital mammography and left breast ultrasonography at the [facility].  In the left breast, at the site of a palpable lump, there was a focal density with ill-defined margins measuring approximately 2.1 cm.  By ultrasonography, this was a hypoechoic solid lesion measuring 1.9 cm with posterior acoustic shadowing and irregular margins.  Biopsy of the breast mass was recommended and performed on December 5th, 2011.  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  Hercept test was 1+ indicating no amplification.  The MIB-1 was borderline at 21.    With this information, the patient was referred to Dr. Mia Creek and on February 04, 2010, he proceeded to left sentinel lymph node sampling, this being positive for a micrometastatic deposit.  He then proceeded to left simple mastectomy with full axillary dissection.  An additional 10 lymph nodes were negative with tumor in this report ([id number]) measured 2.4 cm was described as Grade 2.  Margins were adequate, the closest being 0.5 cm.  There was no evidence of angiolymphatic invasion.    Her subsequent history is as detailed below.  INTERVAL HISTORY: [name] returns today with her daughter [name] for followup of her left breast cancer. With regards to her breast cancer, she is doing well, and continues on  letrozole with good tolerance. Interval history is notable for recent hospitalization at behavioral health in February of this year. Her medications were adjusted, and she is feeling much better. She continues to be followed closely by psychiatry.   Otherwise, interval history is also notable for the fact that  [name] has been having short term followup mammograms of the right breast every 6 months to follow a complicated cyst in the right breast. Most recently, a right mammogram and ultrasound was obtained in August of 2014. This was actually due to be repeated again in February, coinciding with her hospitalization, and the studies were never obtained. She herself is palpated no changes in the right breast.   REVIEW OF SYSTEMS: [name] denies any recent illnesses and has had no fevers, chills, night sweats, or hot flashes. She has mild fatigue and sometimes feels a little weak. She's had no rashes or skin changes. She denies any abnormal bleeding but does admit to bruising easily. Specifically, I will note that she's had no abnormal vaginal bleeding, and she also denies any vaginal dryness. She's eating and drinking well. She has occasional nausea and occasional reflux, neither of which are particularly problematic. She denies any actual emesis. She's had no change in either bowel or bladder habits. She has occasional dry cough, but no phlegm production or pleurisy. She denies any shortness of breath,  chest pain, or palpitations. She has occasional headaches, but actually notes that these have improved over the last several weeks. She's had no significant change in vision. She does have a runny nose and some sinus congestion  she attributes to seasonal allergies. She denies any new or unusual myalgias, arthralgias, or bony pain. She continues to have some problems with anxiety, but today she denies any depression, and she denies any suicidal ideation.  A detailed review of systems is otherwise stable and  noncontributory.   PAST MEDICAL HISTORY: Past Medical History  Diagnosis Date  . Breast CA     (Lt) breast ca dx 02/2010  . Hypertension   . Breast cancer 03/31/2011  . Nausea alone 12/17/2012  . GERD (gastroesophageal reflux disease) 12/17/2012  . Hypertension 12/17/2012  . Schizophrenia   . Depression   . Anxiety   Significant for history of depression + or - psychosis. I do not have a full psychiatric history and no documentation from her primary care physician at this point.  There is a history of high blood pressure, GERD, history of palpitation and history of seasonal allergies.  The patient is status post tonsillectomy and adenoidectomy.    PAST SURGICAL HISTORY: Past Surgical History  Procedure Laterality Date  . Breast surgery    . Tonsillectomy      FAMILY HISTORY History reviewed. No pertinent family history. The patient's father died from lung cancer at the age of 43.  He was a smoker.  The patient's mother died from lung cancer as well at the age of 41.  She did not smoke but of course was exposed to secondhand smoke and also worked in a [employer].  The patient had 5 brothers and 7 sisters.  Two of the sisters had breast cancer, one diagnosed at the age of 35.  The second sister, the patient does not know the age of diagnosis.  There is no history of ovarian cancer in the family and no other history of breast cancer to our knowledge.    GYNECOLOGIC HISTORY:  (Reviewed on 07/02/2013) She is GX P2, first pregnancy to term in her twenties.  She went through the change of life in her late 67s.  She never took hormone replacement.  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  She is divorced and lives alone in a senior citizen complex. This is not an assisted living situation.  She does her own medications, cooking, and all of her self care, but there are many  group activities and she participates in them.  Her daughter, [name], directs marketing for a transportation company locally here in [place].  [name] has a daughter who is a Equities trader in high school.  The patient's other child, a son, has died.  The patient is not a church attender at present.     ADVANCED DIRECTIVES: in place  HEALTH MAINTENANCE: (updated 07/02/2013) History  Substance Use Topics  . Smoking status: Never Smoker   . Smokeless tobacco: Never Used  . Alcohol Use: No     Colonoscopy: Approx 2012, Dr. Collene Mares  PAP:  Not on file  Bone density: 02/19/2013, normal  Lipid panel: Not on file   No Known Allergies   Current Outpatient Prescriptions  Medication Sig Dispense Refill  . ARIPiprazole (ABILIFY) 15 MG tablet Take 15 mg by mouth daily.      Marland Kitchen aspirin 325 MG tablet Take 325 mg by mouth daily.      . clobetasol cream (TEMOVATE) 8.41 % Apply 1 application topically 2 (two) times daily.       . Cyanocobalamin (VITAMIN  B12 PO) Take 1 capsule by mouth.      . docusate sodium (COLACE) 100 MG capsule Take 100 mg by mouth daily as needed.      Marland Kitchen escitalopram (LEXAPRO) 20 MG tablet Take 20 mg by mouth daily.      Marland Kitchen letrozole (FEMARA) 2.5 MG tablet Take 1 tablet (2.5 mg total) by mouth daily.  30 tablet  12  . lisinopril (PRINIVIL,ZESTRIL) 30 MG tablet Take 30 mg by mouth daily.       . Multiple Vitamin (MULTIVITAMIN WITH MINERALS) TABS tablet Take 1 tablet by mouth daily.      Marland Kitchen spironolactone (ALDACTONE) 25 MG tablet Take 25 mg by mouth daily.      . sucralfate (CARAFATE) 1 G tablet Take 1 g by mouth 4 (four) times daily.      . psyllium (METAMUCIL) 58.6 % packet Take 1 packet by mouth as needed.       No current facility-administered medications for this visit.    OBJECTIVE: Middle-aged [place] American woman who appears well and is in no acute distress Filed Vitals:   07/02/13 1354  BP: 150/76  Pulse: 64  Temp: 97.6 F (36.4 C)  Resp: 18     Body mass index  is 34.6 kg/(m^2).    ECOG FS: 0 Filed Weights   07/02/13 1354  Weight: 171 lb 6.4 oz (77.747 kg)   Physical Exam: HEENT:  Sclerae anicteric.  Oropharynx clear, pink, and moist. Neck supple, trachea midline. No thyromegaly.  NODES:  No cervical or supraclavicular lymphadenopathy palpated.  BREAST EXAM:  Right breast is unremarkable. Patient status post left mastectomy with no nodularity, abnormalities, skin changes, or evidence of local recurrence. Axillae are benign bilaterally with no palpable lymphadenopathy. LUNGS:  Clear to auscultation bilaterally with good excursion.  No wheezes or rhonchi HEART:  Regular rate and rhythm. ABDOMEN:  Soft, nontender. No organomegaly or masses palpated. Positive bowel sounds.  MSK:  No focal spinal tenderness to palpation. Good range of motion in the upper extremities. EXTREMITIES:  No peripheral edema.  No lymphedema noted in the left upper extremity. SKIN:  No visible rashes. No excessive ecchymoses. No petechiae. No cyanosis. NEURO:  Nonfocal. Well oriented.  Appropriate affect.    LAB RESULTS: Lab Results  Component Value Date   WBC 6.5 06/25/2013   NEUTROABS 3.8 06/25/2013   HGB 12.4 06/25/2013   HCT 38.7 06/25/2013   MCV 88.9 06/25/2013   PLT 218 06/25/2013      Chemistry      Component Value Date/Time   NA 142 06/25/2013 1242   NA 140 04/11/2013 1828   K 4.1 06/25/2013 1242   K 4.1 04/11/2013 1828   CL 103 04/11/2013 1828   CL 105 06/17/2012 1055   CO2 26 06/25/2013 1242   CO2 27 04/11/2013 1828   BUN 15.5 06/25/2013 1242   BUN 15 04/11/2013 1828   CREATININE 0.9 06/25/2013 1242   CREATININE 0.82 04/11/2013 1828      Component Value Date/Time   CALCIUM 9.9 06/25/2013 1242   CALCIUM 9.4 04/11/2013 1828   ALKPHOS 82 06/25/2013 1242   ALKPHOS 83 04/11/2013 1828   AST 16 06/25/2013 1242   AST 22 04/11/2013 1828   ALT 16 06/25/2013 1242   ALT 22 04/11/2013 1828   BILITOT 0.39 06/25/2013 1242   BILITOT <0.2* 04/11/2013 1828         STUDIES:  Most recent  bone density on 02/19/2013 was normal was normal.  MM Digital Diagnostic Unilat R / US Breast Right 10/21/2012   *RADIOLOGY REPORT*  Clinical Data: Patient presents for a 30-monthfollow-up diagnostic  evaluation of the right breast.  DIGITAL DIAGNOSTIC RIGHT MAMMOGRAM WITH CAD AND RIGHT BREAST  ULTRASOUND:  Comparison: 03/29/2012, 04/10/2012, 01/27/2011  Findings:  ACR Breast Density Category b: There are scattered areas of  fibroglandular density.  Exam demonstrates no change in a small asymmetry over the middle  third of the slightly outer right breast. Remainder of the exam is  unchanged.  Mammographic images were processed with CAD.  Ultrasound is performed, showing no focal abnormality over the 1  o'clock position of the right breast 6 cm from the nipple as was  described on the prior exam. The 11 o'clock position of the right  breast 6 cm from the nipple was also scanned and demonstrates no  focal abnormality. Note that the previously described probable  complicated cyst at the 1 o'clock position of the right breast 6 cm  from the nipple is best seen on today's exam at the 2 o'clock  position of the right breast approximately 3 cm from the nipple as  this likely corresponds to the same abnormality. This measures 3 x  6 x 7 mm and is not significantly changed sonographically. This  likely represents a minimally complicated cyst.  IMPRESSION:  Stable mammogram. Small well-defined probable minimally  complicated cyst at the 2 o'clock position of the right breast 3 cm  from the nipple measuring 3 x 6 x 7 mm.  RECOMMENDATION:  Recommend an additional 651-monthollow-up diagnostic right breast  mammogram and ultrasound which will coincide with the patient's  annual exam in January 2015.  I have discussed the findings and recommendations with the patient.  Results were also provided in writing at the conclusion of the  visit. If applicable, a reminder letter will be sent to the   patient regarding the next appointment.  BI-RADS CATEGORY 3: Probably benign finding(s) - short interval  follow-up suggested.  Original Report Authenticated By: DaMarin OlpM.D.      ASSESSMENT: [id number].o. [place] woman   (1)  status post left mastectomy and axillary lymph node dissection December 2011 for a T2 N1m5m stage IIA, invasive ductal carcinoma, grade 2.  Strongly ER/PR positive, HER2/neu negative with a borderline MIB-1.    (2)  Decided to forego chemotherapy and did not need postmastectomy radiation.    (3)  Accordingly, was started on letrozole in January 2012, the goal being to continue for total of 5 years (January 2017).  (4)  Small well-defined probable minimally complicated cyst at the 2 o'clock position of the right breast measuring 3 x 6 x 7 mm, being followed closely with diagnostic mammogram and ultrasound on a every 6 month basis.   PLAN:  SpaAbryannapears to be doing well with regards to her breast cancer. I see no clinical evidence of disease recurrence at this time. Of course we will continue to follow her very closely with regards to the cyst in the right breast. She is slightly past due for her 6 month mammogram with ultrasound, and we will schedule that as soon as possible.   She appears to be tolerating the letrozole well, and I'm making no changes in her current treatment regimen.  The  plan is to continue to treat for a total of 5 years, after which she will likely be discharged from followup. (This would be in January 2017.) In the meanwhile we will  continue seeing her every 6 months, and her next followup appointment will be in October.  The above plan was reviewed in detail with the patient today. Bora and her daughter both voice understanding and agreement with this plan, and will call with any changes or problems.   Amy Milda Smart  PA-C   07/02/2013

## 2013-07-02 NOTE — Telephone Encounter (Signed)
per pof to sch mamma/US-appt-gave pt copy of sch

## 2013-07-14 ENCOUNTER — Ambulatory Visit
Admission: RE | Admit: 2013-07-14 | Discharge: 2013-07-14 | Disposition: A | Discharge status: Home / Self Care | Payer: Medicare Other | Source: Ambulatory Visit | Attending: Physician Assistant | Admitting: Physician Assistant

## 2013-07-14 DIAGNOSIS — N6001 Solitary cyst of right breast: Secondary | ICD-10-CM

## 2013-07-14 DIAGNOSIS — N63 Unspecified lump in unspecified breast: Secondary | ICD-10-CM | POA: Diagnosis not present

## 2013-07-15 ENCOUNTER — Other Ambulatory Visit: Payer: Self-pay | Admitting: Physician Assistant

## 2013-07-15 DIAGNOSIS — Z853 Personal history of malignant neoplasm of breast: Secondary | ICD-10-CM

## 2013-07-15 DIAGNOSIS — N6001 Solitary cyst of right breast: Secondary | ICD-10-CM

## 2013-08-26 DIAGNOSIS — I119 Hypertensive heart disease without heart failure: Secondary | ICD-10-CM | POA: Diagnosis not present

## 2013-08-26 DIAGNOSIS — I509 Heart failure, unspecified: Secondary | ICD-10-CM | POA: Diagnosis not present

## 2013-08-26 DIAGNOSIS — R7301 Impaired fasting glucose: Secondary | ICD-10-CM | POA: Diagnosis not present

## 2013-08-26 DIAGNOSIS — K3189 Other diseases of stomach and duodenum: Secondary | ICD-10-CM | POA: Diagnosis not present

## 2013-08-26 DIAGNOSIS — E559 Vitamin D deficiency, unspecified: Secondary | ICD-10-CM | POA: Diagnosis not present

## 2013-08-26 DIAGNOSIS — E785 Hyperlipidemia, unspecified: Secondary | ICD-10-CM | POA: Diagnosis not present

## 2013-08-26 DIAGNOSIS — E538 Deficiency of other specified B group vitamins: Secondary | ICD-10-CM | POA: Diagnosis not present

## 2013-08-26 DIAGNOSIS — I10 Essential (primary) hypertension: Secondary | ICD-10-CM | POA: Diagnosis not present

## 2013-08-26 DIAGNOSIS — R109 Unspecified abdominal pain: Secondary | ICD-10-CM | POA: Diagnosis not present

## 2013-10-16 DIAGNOSIS — I119 Hypertensive heart disease without heart failure: Secondary | ICD-10-CM | POA: Diagnosis not present

## 2013-10-16 DIAGNOSIS — E559 Vitamin D deficiency, unspecified: Secondary | ICD-10-CM | POA: Diagnosis not present

## 2013-10-16 DIAGNOSIS — I509 Heart failure, unspecified: Secondary | ICD-10-CM | POA: Diagnosis not present

## 2013-10-16 DIAGNOSIS — K3189 Other diseases of stomach and duodenum: Secondary | ICD-10-CM | POA: Diagnosis not present

## 2013-10-16 DIAGNOSIS — R7301 Impaired fasting glucose: Secondary | ICD-10-CM | POA: Diagnosis not present

## 2013-10-16 DIAGNOSIS — R109 Unspecified abdominal pain: Secondary | ICD-10-CM | POA: Diagnosis not present

## 2013-10-16 DIAGNOSIS — R1013 Epigastric pain: Secondary | ICD-10-CM | POA: Diagnosis not present

## 2013-10-16 DIAGNOSIS — I10 Essential (primary) hypertension: Secondary | ICD-10-CM | POA: Diagnosis not present

## 2013-10-16 DIAGNOSIS — I739 Peripheral vascular disease, unspecified: Secondary | ICD-10-CM | POA: Diagnosis not present

## 2013-10-16 DIAGNOSIS — E785 Hyperlipidemia, unspecified: Secondary | ICD-10-CM | POA: Diagnosis not present

## 2013-10-28 DIAGNOSIS — E559 Vitamin D deficiency, unspecified: Secondary | ICD-10-CM | POA: Diagnosis not present

## 2013-10-28 DIAGNOSIS — E785 Hyperlipidemia, unspecified: Secondary | ICD-10-CM | POA: Diagnosis not present

## 2013-10-28 DIAGNOSIS — I509 Heart failure, unspecified: Secondary | ICD-10-CM | POA: Diagnosis not present

## 2013-10-28 DIAGNOSIS — I119 Hypertensive heart disease without heart failure: Secondary | ICD-10-CM | POA: Diagnosis not present

## 2013-10-28 DIAGNOSIS — I10 Essential (primary) hypertension: Secondary | ICD-10-CM | POA: Diagnosis not present

## 2013-10-28 DIAGNOSIS — R7301 Impaired fasting glucose: Secondary | ICD-10-CM | POA: Diagnosis not present

## 2013-10-28 DIAGNOSIS — E538 Deficiency of other specified B group vitamins: Secondary | ICD-10-CM | POA: Diagnosis not present

## 2013-10-28 DIAGNOSIS — K3189 Other diseases of stomach and duodenum: Secondary | ICD-10-CM | POA: Diagnosis not present

## 2013-12-10 ENCOUNTER — Other Ambulatory Visit: Payer: Self-pay | Admitting: *Deleted

## 2013-12-10 DIAGNOSIS — C50919 Malignant neoplasm of unspecified site of unspecified female breast: Secondary | ICD-10-CM

## 2013-12-11 ENCOUNTER — Other Ambulatory Visit (HOSPITAL_BASED_OUTPATIENT_CLINIC_OR_DEPARTMENT_OTHER): Payer: Medicare Other

## 2013-12-11 DIAGNOSIS — C50919 Malignant neoplasm of unspecified site of unspecified female breast: Secondary | ICD-10-CM

## 2013-12-11 LAB — CBC WITH DIFFERENTIAL/PLATELET
BASO%: 0.8 % (ref 0.0–2.0)
Basophils Absolute: 0.1 10*3/uL (ref 0.0–0.1)
EOS%: 3 % (ref 0.0–7.0)
Eosinophils Absolute: 0.2 10*3/uL (ref 0.0–0.5)
HCT: 38.3 % (ref 34.8–46.6)
HGB: 12.4 g/dL (ref 11.6–15.9)
LYMPH%: 29.6 % (ref 14.0–49.7)
MCH: 28.7 pg (ref 25.1–34.0)
MCHC: 32.4 g/dL (ref 31.5–36.0)
MCV: 88.3 fL (ref 79.5–101.0)
MONO#: 0.5 10*3/uL (ref 0.1–0.9)
MONO%: 8 % (ref 0.0–14.0)
NEUT#: 4 10*3/uL (ref 1.5–6.5)
NEUT%: 58.6 % (ref 38.4–76.8)
Platelets: 211 10*3/uL (ref 145–400)
RBC: 4.33 10*6/uL (ref 3.70–5.45)
RDW: 14.3 % (ref 11.2–14.5)
WBC: 6.7 10*3/uL (ref 3.9–10.3)
lymph#: 2 10*3/uL (ref 0.9–3.3)

## 2013-12-11 LAB — COMPREHENSIVE METABOLIC PANEL (CC13)
ALT: 22 U/L (ref 0–55)
AST: 18 U/L (ref 5–34)
Albumin: 3.9 g/dL (ref 3.5–5.0)
Alkaline Phosphatase: 86 U/L (ref 40–150)
Anion Gap: 6 mEq/L (ref 3–11)
BUN: 16.1 mg/dL (ref 7.0–26.0)
CO2: 26 mEq/L (ref 22–29)
Calcium: 9.9 mg/dL (ref 8.4–10.4)
Chloride: 108 mEq/L (ref 98–109)
Creatinine: 1.2 mg/dL — ABNORMAL HIGH (ref 0.6–1.1)
Glucose: 106 mg/dl (ref 70–140)
Potassium: 4.2 mEq/L (ref 3.5–5.1)
Sodium: 141 mEq/L (ref 136–145)
Total Bilirubin: 0.28 mg/dL (ref 0.20–1.20)
Total Protein: 7.3 g/dL (ref 6.4–8.3)

## 2013-12-18 ENCOUNTER — Telehealth: Payer: Self-pay | Admitting: Oncology

## 2013-12-18 ENCOUNTER — Ambulatory Visit (HOSPITAL_BASED_OUTPATIENT_CLINIC_OR_DEPARTMENT_OTHER): Payer: Medicare Other | Admitting: Oncology

## 2013-12-18 VITALS — BP 125/62 | HR 62 | Temp 98.2°F | Resp 20 | Ht 59.0 in | Wt 190.1 lb

## 2013-12-18 DIAGNOSIS — C50912 Malignant neoplasm of unspecified site of left female breast: Secondary | ICD-10-CM

## 2013-12-18 DIAGNOSIS — Z170 Estrogen receptor positive status [ER+]: Secondary | ICD-10-CM

## 2013-12-18 NOTE — Progress Notes (Signed)
ID: Elizabeth [name]   DOB: [birth year]  MR#: [id number]  MWN#:[id number]  PCP: Benito Mccreedy, MD GYN:  SU:  OTHER MD: Juanita Craver, MD  CHIEF COMPLAINT:  Hx of Left Breast Cancer  CURRENT TREATMENT: Letrozole    HISTORY OF PRESENT ILLNESS: From the original intake note:  The patient had been admitted to the [facility], she tells me, with a diagnosis of depression.  (I do not have those records.) While an in-patient, she says she noticed a lump in her left breast and brought it to medical attention.  Accordingly, on November 7th, 2011, she had bilateral digital mammography and left breast ultrasonography at the [facility]  In the left breast, at the site of a palpable lump, there was a focal density with ill-defined margins measuring approximately 2.1 cm.  By ultrasonography, this was a hypoechoic solid lesion measuring 1.9 cm with posterior acoustic shadowing and irregular margins.  Biopsy of the breast mass was recommended and performed on December 5th, 2011.  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  Hercept test was 1+ indicating no amplification.  The MIB-1 was borderline at 21.    With this information, the patient was referred to Dr. Mia Creek and on February 04, 2010, he proceeded to left sentinel lymph node sampling, this being positive for a micrometastatic deposit.  He then proceeded to left simple mastectomy with full axillary dissection.  An additional 10 lymph nodes were negative with tumor in this report ([id number]) measured 2.4 cm was described as Grade 2.  Margins were adequate, the closest being 0.5 cm.  There was no evidence of angiolymphatic invasion.    Her subsequent history is as detailed below.  INTERVAL HISTORY: Elizabeth [name] returns today with her daughter Elizabeth [name] for followup of her left breast cancer.Sparc he tells me she is  "fine". She has a little bit of a dry cough, which she thinks is due to allergies. She has not brought up any phlegm, coughed up any blood, does not have any pleurisy, and has not had any temperatures. She feels "weak all over". She has some joint pains and back pain. She has gained a lot of weight: She was 157 pounds 18 months ago and she now weighs 190 pounds on the same scale. She is tolerating letrozole however with no side effects that she is aware of.  REVIEW OF SYSTEMS: Elizabeth [name] sleeps poorly. She feels tired all the time. She still has pain in her left chest wall and left arm. Her vision sometimes is blurred. She almost never has headaches. She does have a little bit of a running nose, and minimal hoarseness. Sometimes food goes the wrong way. She has a history of ankle swelling. Sometimes she thinks she has palpitations. She has been constipated for a long time. There has been no blood in her stool no black or tarry stools. The joint pains are mostly in the back enlarged joints. A detailed review of systems was otherwise stable.  PAST MEDICAL HISTORY: Past Medical History  Diagnosis Date  . Breast CA     (Lt) breast ca dx 02/2010  . Hypertension   . Breast cancer 03/31/2011  . Nausea alone 12/17/2012  . GERD (gastroesophageal reflux disease) 12/17/2012  . Hypertension 12/17/2012  . Schizophrenia   . Depression   . Anxiety   Significant for history of depression + or - psychosis. I do not have a  full psychiatric history and no documentation from her primary care physician at this point.  There is a history of high blood pressure, GERD, history of palpitation and history of seasonal allergies.  The patient is status post tonsillectomy and adenoidectomy.    PAST SURGICAL HISTORY: Past Surgical History  Procedure Laterality Date  . Breast surgery    . Tonsillectomy      FAMILY HISTORY No family history on file. The patient's father died from lung cancer at the age of 65.  He was a  smoker.  The patient's mother died from lung cancer as well at the age of 58.  She did not smoke but of course was exposed to secondhand smoke and also worked in a Story.  The patient had 5 brothers and 7 sisters.  Two of the sisters had breast cancer, one diagnosed at the age of 81.  The second sister, the patient does not know the age of diagnosis.  There is no history of ovarian cancer in the family and no other history of breast cancer to our knowledge.    GYNECOLOGIC HISTORY:  (Reviewed on 07/02/2013) She is GX P2, first pregnancy to term in her twenties.  She went through the change of life in her late 80s.  She never took hormone replacement.  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  She is divorced and lives alone in a senior citizen complex. This is not an assisted living situation.  She does her own medications, cooking, and all of her self care, but there are many group activities and she participates in them.  Her daughter, [name], directs marketing for a transportation company locally here in [place].  Elizabeth [name] has a daughter who is a Equities trader in high school.  The patient's other child, a son, has died.  The patient is not a church attender at present.     ADVANCED DIRECTIVES: in place  HEALTH MAINTENANCE: (updated 07/02/2013) History  Substance Use Topics  . Smoking status: Never Smoker   . Smokeless tobacco: Never Used  . Alcohol Use: No     Colonoscopy: Approx 2012, Dr. Collene Mares  PAP:  Not on file  Bone density: 02/19/2013, normal  Lipid panel: Not on file   No Known Allergies   Current Outpatient Prescriptions  Medication Sig Dispense Refill  . ARIPiprazole (ABILIFY) 15 MG tablet Take 15 mg by mouth daily.      Marland Kitchen aspirin 325 MG tablet Take 325 mg by mouth daily.      . clobetasol cream (TEMOVATE) 4.46 % Apply 1 application topically 2 (two) times  daily.       . Cyanocobalamin (VITAMIN B12 PO) Take 1 capsule by mouth.      . docusate sodium (COLACE) 100 MG capsule Take 100 mg by mouth daily as needed.      Marland Kitchen escitalopram (LEXAPRO) 20 MG tablet Take 20 mg by mouth daily.      Marland Kitchen letrozole (FEMARA) 2.5 MG tablet Take 1 tablet (2.5 mg total) by mouth daily.  30 tablet  12  . lisinopril (PRINIVIL,ZESTRIL) 30 MG tablet Take 30 mg by mouth daily.       . Multiple Vitamin (MULTIVITAMIN WITH MINERALS) TABS tablet Take 1 tablet by mouth daily.      . psyllium (METAMUCIL) 58.6 % packet Take 1 packet by mouth as needed.      Marland Kitchen  spironolactone (ALDACTONE) 25 MG tablet Take 25 mg by mouth daily.      . sucralfate (CARAFATE) 1 G tablet Take 1 g by mouth 4 (four) times daily.       No current facility-administered medications for this visit.    OBJECTIVE: Middle-aged [place] American woman who appears  stated age  31 Vitals:   12/18/13 1519  BP: 125/62  Pulse: 62  Temp: 98.2 F (36.8 C)  Resp: 20     Body mass index is 38.38 kg/(m^2).    ECOG FS: 1 Filed Weights   12/18/13 1519  Weight: 190 lb 1.6 oz (86.229 kg)   Sclerae unicteric, pupils round and equal Oropharynx clear and moist No cervical or supraclavicular adenopathy Lungs no rales or rhonchi, fair excursion bilaterally Heart regular rate and rhythm Abd soft, obese, nontender, positive bowel sounds, no masses palpated MSK no focal spinal tenderness, no upper extremity lymphedema Neuro: nonfocal, well oriented, appropriate affect Breasts: The right breast is unremarkable. The left breast is status post mastectomy. There is no evidence of chest wall recurrence. Left axilla is benign.    LAB RESULTS: Lab Results  Component Value Date   WBC 6.7 12/11/2013   NEUTROABS 4.0 12/11/2013   HGB 12.4 12/11/2013   HCT 38.3 12/11/2013   MCV 88.3 12/11/2013   PLT 211 12/11/2013      Chemistry      Component Value Date/Time   NA 141 12/11/2013 0848   NA 140 04/11/2013 1828   K 4.2  12/11/2013 0848   K 4.1 04/11/2013 1828   CL 103 04/11/2013 1828   CL 105 06/17/2012 1055   CO2 26 12/11/2013 0848   CO2 27 04/11/2013 1828   BUN 16.1 12/11/2013 0848   BUN 15 04/11/2013 1828   CREATININE 1.2* 12/11/2013 0848   CREATININE 0.82 04/11/2013 1828      Component Value Date/Time   CALCIUM 9.9 12/11/2013 0848   CALCIUM 9.4 04/11/2013 1828   ALKPHOS 86 12/11/2013 0848   ALKPHOS 83 04/11/2013 1828   AST 18 12/11/2013 0848   AST 22 04/11/2013 1828   ALT 22 12/11/2013 0848   ALT 22 04/11/2013 1828   BILITOT 0.28 12/11/2013 0848   BILITOT <0.2* 04/11/2013 1828     STUDIES:  Most recent bone density on 02/19/2013 was normal     CLINICAL DATA: Patient presents for second short-term followup of  probably benign right breast mass.  EXAM:  DIGITAL DIAGNOSTIC RIGHT MAMMOGRAM WITH CAD  ULTRASOUND RIGHT BREAST  COMPARISON: [facility]  ACR Breast Density Category b: There are scattered areas of  fibroglandular density.  FINDINGS:  Stable right breast parenchymal pattern.  Mammographic images were processed with CAD.  On physical exam, I palpate no discrete mass within right breast 2  o'clock position.  Ultrasound is performed, showing a 5 x 2 x 3 mm likely complicated  cyst within the right breast 2 o'clock position 3 cm from the  nipple, stable dating back to 04/10/2012 where it measured 7 x 4 x 8  mm.  IMPRESSION:  Stable likely benign right breast mass.  RECOMMENDATION:  Bilateral diagnostic mammography with right breast ultrasound in 1  year to demonstrate 2 years of stability.  I have discussed the findings and recommendations with the patient.  Results were also provided in writing at the conclusion of the  visit. If applicable, a reminder letter will be sent to the patient  regarding the next appointment.  BI-RADS CATEGORY 3: Probably benign.  Electronically Signed  By: Lovey Newcomer M.D.  On: 07/14/2013 13:28      ASSESSMENT: 68 y.o. [place] woman   (1)  status post left mastectomy  and axillary lymph node dissection December 2011 for a T2 N9mic, stage IIA, invasive ductal carcinoma, grade 2.  Strongly ER/PR positive, HER2/neu negative with a borderline MIB-1.    (2)  Decided to forego chemotherapy and did not need postmastectomy radiation.    (3)  Accordingly, was started on letrozole in January 2012, the goal being to continue for total of 5 years (January 2017).  (4)  Small well-defined probable minimally complicated cyst at the 2 o'clock position of the right breast measuring 3 x 6 x 7 mm, being followed closely with diagnostic mammogram and ultrasound on a every 6 month basis.   PLAN:  Shian is doing very well from a breast cancer point of view, no just about 4 years out from her definitive surgery with no evidence of disease recurrence. The plan is to continue letrozole for a total of 4 years, which is going to take [place] to January of 2017, at which point she will "graduate" from followup here.  She has gained about 2 pounds a month for last 16 months. Her daughter tells me she eats a "junk" diet. I think Sparc he would benefit from meeting with our nutritionist. I have set that up. I also strongly encouraged her to keep up an increased her walking program. She tells me she is walking about 30 minutes today, walking as far as a quarter mile in that time. She should be able to do a mile and a half and that. If time eventually. She will continue to walk a time as opposed walking any distance. We will review how she is doing with that at her next visit here.  She will see [place] again in 6 months. She knows to call for any problems that may develop before that visit.  Chauncey Cruel, MD    12/18/2013

## 2013-12-18 NOTE — Telephone Encounter (Signed)
, °

## 2013-12-30 ENCOUNTER — Ambulatory Visit: Payer: Medicare Other | Admitting: Nutrition

## 2013-12-30 NOTE — Progress Notes (Signed)
Patient added to schedule today.  She is a 68 year old female diagnosed with breast cancer.  She is a patient of Dr. Jana Hakim.  Past medical history includes hypertension, GERD, schizophrenia, depression, and anxiety.  Medications include Abilify, and B12, Colace, Lexapro, from oral multivitamin, Metamucil, and Carafate.    Labs were reviewed.  Height: 4 feet 11 inches.  Weight: 190 pounds.  BMI 38.38.  Patient states she wants assistance with weight loss.  Patient is not currently receiving chemotherapy.  Patient reports she believes her weight gain is a result of less exercise.  She plans on buying an exercise bike.  Nutrition diagnosis: Obesity related to decreased physical activity as evidenced by BMI of 38.38.  Intervention: Patient education on plant-based low-fat diet.  Provided sample menu for patient to follow.  Recommended patient increase physical activity to goal of 30 minutes 5 days weekly with permission from her M.D.  Fact Sheets provided.  Teach back method used.  Nutrition diagnosis resolved.  Monitoring, evaluation, goals: Patient will follow plant-based diet with increased activity to achieve weight loss.  Patient should be referred to [facility] for further information as needed.  No followup scheduled.  **Disclaimer: This note was dictated with voice recognition software. Similar sounding words can inadvertently be transcribed and this note may contain transcription errors which may not have been corrected upon publication of note.**

## 2014-02-14 ENCOUNTER — Other Ambulatory Visit (HOSPITAL_COMMUNITY): Payer: Self-pay | Admitting: Psychiatry

## 2014-02-17 DIAGNOSIS — E538 Deficiency of other specified B group vitamins: Secondary | ICD-10-CM | POA: Diagnosis not present

## 2014-02-17 DIAGNOSIS — I509 Heart failure, unspecified: Secondary | ICD-10-CM | POA: Diagnosis not present

## 2014-02-17 DIAGNOSIS — F329 Major depressive disorder, single episode, unspecified: Secondary | ICD-10-CM | POA: Diagnosis not present

## 2014-02-17 DIAGNOSIS — I10 Essential (primary) hypertension: Secondary | ICD-10-CM | POA: Diagnosis not present

## 2014-02-17 DIAGNOSIS — R7309 Other abnormal glucose: Secondary | ICD-10-CM | POA: Diagnosis not present

## 2014-02-17 DIAGNOSIS — I119 Hypertensive heart disease without heart failure: Secondary | ICD-10-CM | POA: Diagnosis not present

## 2014-02-17 DIAGNOSIS — R1013 Epigastric pain: Secondary | ICD-10-CM | POA: Diagnosis not present

## 2014-02-17 DIAGNOSIS — E785 Hyperlipidemia, unspecified: Secondary | ICD-10-CM | POA: Diagnosis not present

## 2014-02-18 ENCOUNTER — Other Ambulatory Visit (HOSPITAL_COMMUNITY): Payer: Self-pay | Admitting: Psychiatry

## 2014-03-14 ENCOUNTER — Other Ambulatory Visit (HOSPITAL_COMMUNITY): Payer: Self-pay | Admitting: Psychiatry

## 2014-03-16 ENCOUNTER — Other Ambulatory Visit (HOSPITAL_COMMUNITY): Payer: Self-pay | Admitting: Psychiatry

## 2014-04-11 ENCOUNTER — Other Ambulatory Visit (HOSPITAL_COMMUNITY): Payer: Self-pay | Admitting: Psychiatry

## 2014-04-13 NOTE — Telephone Encounter (Signed)
Not an open patient at this facility.

## 2014-05-01 DIAGNOSIS — F329 Major depressive disorder, single episode, unspecified: Secondary | ICD-10-CM | POA: Diagnosis not present

## 2014-05-01 DIAGNOSIS — R7309 Other abnormal glucose: Secondary | ICD-10-CM | POA: Diagnosis not present

## 2014-05-01 DIAGNOSIS — I509 Heart failure, unspecified: Secondary | ICD-10-CM | POA: Diagnosis not present

## 2014-05-01 DIAGNOSIS — Z136 Encounter for screening for cardiovascular disorders: Secondary | ICD-10-CM | POA: Diagnosis not present

## 2014-05-01 DIAGNOSIS — I10 Essential (primary) hypertension: Secondary | ICD-10-CM | POA: Diagnosis not present

## 2014-05-01 DIAGNOSIS — Z1389 Encounter for screening for other disorder: Secondary | ICD-10-CM | POA: Diagnosis not present

## 2014-05-01 DIAGNOSIS — J309 Allergic rhinitis, unspecified: Secondary | ICD-10-CM | POA: Diagnosis not present

## 2014-05-01 DIAGNOSIS — G629 Polyneuropathy, unspecified: Secondary | ICD-10-CM | POA: Diagnosis not present

## 2014-05-01 DIAGNOSIS — Z01118 Encounter for examination of ears and hearing with other abnormal findings: Secondary | ICD-10-CM | POA: Diagnosis not present

## 2014-05-01 DIAGNOSIS — E559 Vitamin D deficiency, unspecified: Secondary | ICD-10-CM | POA: Diagnosis not present

## 2014-05-01 DIAGNOSIS — Z0000 Encounter for general adult medical examination without abnormal findings: Secondary | ICD-10-CM | POA: Diagnosis not present

## 2014-05-01 DIAGNOSIS — E785 Hyperlipidemia, unspecified: Secondary | ICD-10-CM | POA: Diagnosis not present

## 2014-05-09 ENCOUNTER — Other Ambulatory Visit: Payer: Self-pay | Admitting: Oncology

## 2014-05-09 ENCOUNTER — Other Ambulatory Visit (HOSPITAL_COMMUNITY): Payer: Self-pay | Admitting: Psychiatry

## 2014-05-10 NOTE — Telephone Encounter (Signed)
Patient was seen as a consult only.  Not open to outpatient practice and was only seen prior to going inpatient to [facility] denied and requested pharmacy follow up with current provider.

## 2014-05-21 DIAGNOSIS — Z0100 Encounter for examination of eyes and vision without abnormal findings: Secondary | ICD-10-CM | POA: Diagnosis not present

## 2014-05-21 DIAGNOSIS — F329 Major depressive disorder, single episode, unspecified: Secondary | ICD-10-CM | POA: Diagnosis not present

## 2014-05-21 DIAGNOSIS — E559 Vitamin D deficiency, unspecified: Secondary | ICD-10-CM | POA: Diagnosis not present

## 2014-05-21 DIAGNOSIS — R7309 Other abnormal glucose: Secondary | ICD-10-CM | POA: Diagnosis not present

## 2014-05-21 DIAGNOSIS — G629 Polyneuropathy, unspecified: Secondary | ICD-10-CM | POA: Diagnosis not present

## 2014-05-21 DIAGNOSIS — H538 Other visual disturbances: Secondary | ICD-10-CM | POA: Diagnosis not present

## 2014-05-21 DIAGNOSIS — E785 Hyperlipidemia, unspecified: Secondary | ICD-10-CM | POA: Diagnosis not present

## 2014-05-21 DIAGNOSIS — I10 Essential (primary) hypertension: Secondary | ICD-10-CM | POA: Diagnosis not present

## 2014-05-21 DIAGNOSIS — I509 Heart failure, unspecified: Secondary | ICD-10-CM | POA: Diagnosis not present

## 2014-06-18 ENCOUNTER — Other Ambulatory Visit (HOSPITAL_BASED_OUTPATIENT_CLINIC_OR_DEPARTMENT_OTHER): Payer: Medicare Other

## 2014-06-18 DIAGNOSIS — C50912 Malignant neoplasm of unspecified site of left female breast: Secondary | ICD-10-CM | POA: Diagnosis present

## 2014-06-18 LAB — CBC WITH DIFFERENTIAL/PLATELET
BASO%: 0.9 % (ref 0.0–2.0)
Basophils Absolute: 0.1 10*3/uL (ref 0.0–0.1)
EOS%: 2.4 % (ref 0.0–7.0)
Eosinophils Absolute: 0.2 10*3/uL (ref 0.0–0.5)
HCT: 36.7 % (ref 34.8–46.6)
HGB: 11.9 g/dL (ref 11.6–15.9)
LYMPH%: 28.7 % (ref 14.0–49.7)
MCH: 28 pg (ref 25.1–34.0)
MCHC: 32.4 g/dL (ref 31.5–36.0)
MCV: 86.4 fL (ref 79.5–101.0)
MONO#: 0.6 10*3/uL (ref 0.1–0.9)
MONO%: 7.3 % (ref 0.0–14.0)
NEUT#: 5 10*3/uL (ref 1.5–6.5)
NEUT%: 60.7 % (ref 38.4–76.8)
Platelets: 205 10*3/uL (ref 145–400)
RBC: 4.24 10*6/uL (ref 3.70–5.45)
RDW: 13.9 % (ref 11.2–14.5)
WBC: 8.2 10*3/uL (ref 3.9–10.3)
lymph#: 2.4 10*3/uL (ref 0.9–3.3)

## 2014-06-18 LAB — COMPREHENSIVE METABOLIC PANEL (CC13)
ALT: 16 U/L (ref 0–55)
AST: 15 U/L (ref 5–34)
Albumin: 3.8 g/dL (ref 3.5–5.0)
Alkaline Phosphatase: 86 U/L (ref 40–150)
Anion Gap: 7 mEq/L (ref 3–11)
BUN: 17.6 mg/dL (ref 7.0–26.0)
CO2: 27 mEq/L (ref 22–29)
Calcium: 9.5 mg/dL (ref 8.4–10.4)
Chloride: 108 mEq/L (ref 98–109)
Creatinine: 1.1 mg/dL (ref 0.6–1.1)
EGFR: 62 mL/min/{1.73_m2} — ABNORMAL LOW (ref >90)
Glucose: 108 mg/dl (ref 70–140)
Potassium: 4.1 mEq/L (ref 3.5–5.1)
Sodium: 141 mEq/L (ref 136–145)
Total Bilirubin: 0.3 mg/dL (ref 0.20–1.20)
Total Protein: 7 g/dL (ref 6.4–8.3)

## 2014-06-25 ENCOUNTER — Telehealth: Payer: Self-pay | Admitting: Nurse Practitioner

## 2014-06-25 ENCOUNTER — Other Ambulatory Visit: Payer: Self-pay | Admitting: *Deleted

## 2014-06-25 ENCOUNTER — Ambulatory Visit (HOSPITAL_BASED_OUTPATIENT_CLINIC_OR_DEPARTMENT_OTHER): Payer: Medicare Other | Admitting: Nurse Practitioner

## 2014-06-25 ENCOUNTER — Encounter: Payer: Self-pay | Admitting: Nurse Practitioner

## 2014-06-25 VITALS — BP 122/60 | HR 69 | Temp 98.2°F | Resp 18 | Ht 59.0 in | Wt 195.9 lb

## 2014-06-25 DIAGNOSIS — C50912 Malignant neoplasm of unspecified site of left female breast: Secondary | ICD-10-CM

## 2014-06-25 DIAGNOSIS — Z170 Estrogen receptor positive status [ER+]: Secondary | ICD-10-CM | POA: Diagnosis not present

## 2014-06-25 NOTE — Progress Notes (Signed)
ID: Elizabeth [name]   DOB: June 28, 1945  MR#: [id number]  XIH#:[id number]  PCP: Benito Mccreedy, MD GYN:  SU:  OTHER MD: Juanita Craver, MD  CHIEF COMPLAINT:  Hx of Left Breast Cancer  CURRENT TREATMENT: Letrozole  BREAST CANCER HISTORY: From the original intake note:  The patient had been admitted to the [facility], she tells me, with a diagnosis of depression.  (I do not have those records.) While an in-patient, she says she noticed a lump in her left breast and brought it to medical attention.  Accordingly, on November 7th, 2011, she had bilateral digital mammography and left breast ultrasonography at the [facility].  In the left breast, at the site of a palpable lump, there was a focal density with ill-defined margins measuring approximately 2.1 cm.  By ultrasonography, this was a hypoechoic solid lesion measuring 1.9 cm with posterior acoustic shadowing and irregular margins.  Biopsy of the breast mass was recommended and performed on December 5th, 2011.  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  Hercept test was 1+ indicating no amplification.  The MIB-1 was borderline at 21.    With this information, the patient was referred to Dr. Mia Creek and on February 04, 2010, he proceeded to left sentinel lymph node sampling, this being positive for a micrometastatic deposit.  He then proceeded to left simple mastectomy with full axillary dissection.  An additional 10 lymph nodes were negative with tumor in this report ([id number]) measured 2.4 cm was described as Grade 2.  Margins were adequate, the closest being 0.5 cm.  There was no evidence of angiolymphatic invasion.    Her subsequent history is as detailed below.  INTERVAL HISTORY: Elizabeth [name] returns today for follow up of her breast cancer, accompanied by her daughter [name]. She has been on letrozole  since January 2017 and is tolerating this drug well. She has infrequent mild hot flashes, and some joint aches, but denies vaginal changes. The interval history is remarkable for a 4lb weight gain since her last visit 6 months ago. She does chair exercises and walks on occasion, but knows her diet can use work.   REVIEW OF SYSTEMS: Elizabeth [name] denies fevers, chills, nausea, vomiting, or changes in bowel or bladder habits. She is eating and drinking well. She denies shortness of breath, chest pain, cough, or palpitations. She does not sleep well and is fatigued during the day. She denies headaches, dizziness, weakness, or vision changes. She has an extensive history of depression, requiring hospitalization at times. She has not been admitted in the last 6 months. A detailed review of systems is otherwise stable.  PAST MEDICAL HISTORY: Past Medical History  Diagnosis Date  . Breast CA     (Lt) breast ca dx 02/2010  . Hypertension   . Breast cancer 03/31/2011  . Nausea alone 12/17/2012  . GERD (gastroesophageal reflux disease) 12/17/2012  . Hypertension 12/17/2012  . Schizophrenia   . Depression   . Anxiety   Significant for history of depression + or - psychosis. I do not have a full psychiatric history and no documentation from her primary care physician at this point.  There is a history of high blood pressure, GERD, history of palpitation and history of seasonal allergies.  The patient is status post tonsillectomy and adenoidectomy.    PAST SURGICAL HISTORY: Past Surgical History  Procedure Laterality Date  . Breast surgery    .  Tonsillectomy      FAMILY HISTORY No family history on file. The patient's father died from lung cancer at the age of 37.  He was a smoker.  The patient's mother died from lung cancer as well at the age of 42.  She did not smoke but of course was exposed to secondhand smoke and also worked in a [employer].  The patient had 5 brothers and 7 sisters.  Two of the  sisters had breast cancer, one diagnosed at the age of 30.  The second sister, the patient does not know the age of diagnosis.  There is no history of ovarian cancer in the family and no other history of breast cancer to our knowledge.    GYNECOLOGIC HISTORY:  (Reviewed on 07/02/2013) She is GX P2, first pregnancy to term in her twenties.  She went through the change of life in her late 20s.  She never took hormone replacement.  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  She is divorced and lives alone in a senior citizen complex. This is not an assisted living situation.  She does her own medications, cooking, and all of her self care, but there are many group activities and she participates in them.  Her daughter, [name], directs marketing for a transportation company locally here in [place].  [name] has a daughter who is a Equities trader in high school.  The patient's other child, a son, has died.  The patient is not a church attender at present.     ADVANCED DIRECTIVES: in place  HEALTH MAINTENANCE: (updated 07/02/2013) History  Substance Use Topics  . Smoking status: Never Smoker   . Smokeless tobacco: Never Used  . Alcohol Use: No     Colonoscopy: Approx 2012, Dr. Collene Mares  PAP:  Not on file  Bone density: 02/19/2013, normal  Lipid panel: Not on file   No Known Allergies   Current Outpatient Prescriptions  Medication Sig Dispense Refill  . ARIPiprazole (ABILIFY) 15 MG tablet Take 15 mg by mouth daily.    Marland Kitchen aspirin 325 MG tablet Take 325 mg by mouth daily.    . CRESTOR 10 MG tablet Take 10 mg by mouth daily.     . Cyanocobalamin (VITAMIN B12 PO) Take 1 capsule by mouth.    . docusate sodium (COLACE) 100 MG capsule Take 100 mg by mouth daily as needed.    Marland Kitchen escitalopram (LEXAPRO) 20 MG tablet Take 20 mg by mouth daily.    . fluticasone (FLONASE) 50 MCG/ACT nasal spray      . letrozole (FEMARA) 2.5 MG tablet TAKE 1 TABLET BY MOUTH ONCE DAILY. 90 tablet 4  . lisinopril (PRINIVIL,ZESTRIL) 30 MG tablet Take 30 mg by mouth daily.     . Multiple Vitamin (MULTIVITAMIN WITH MINERALS) TABS tablet Take 1 tablet by mouth daily.    Marland Kitchen spironolactone (ALDACTONE) 25 MG tablet Take 25 mg by mouth daily.    . sucralfate (CARAFATE) 1 G tablet Take 1 g by mouth 4 (four) times daily.    . clobetasol cream (TEMOVATE) 1.10 % Apply 1 application topically 2 (two) times daily.     . psyllium (METAMUCIL) 58.6 % packet Take 1 packet by mouth as needed.     No current facility-administered medications for this visit.    OBJECTIVE: Middle-aged [place] American woman who appears  stated age  69 Vitals:  06/25/14 0927  BP: 113/48  Pulse: 69  Temp: 98.2 F (36.8 C)  Resp: 18     Body mass index is 39.55 kg/(m^2).    ECOG FS: 1 Filed Weights   06/25/14 0927  Weight: 195 lb 14.4 oz (88.86 kg)    Skin: warm, dry  HEENT: sclerae anicteric, conjunctivae pink, oropharynx clear. No thrush or mucositis.  Lymph Nodes: No cervical or supraclavicular lymphadenopathy  Lungs: clear to auscultation bilaterally, no rales, wheezes, or rhonci  Heart: regular rate and rhythm  Abdomen: round, soft, non tender, positive bowel sounds  Musculoskeletal: No focal spinal tenderness, no peripheral edema  Neuro: non focal, well oriented, appropriate affect  Breasts: left breast status post mastectomy. No evidence of recurrent disease. Left axilla benign. Right breast unremarkable. No evidence of skin or nipple changes   LAB RESULTS: Lab Results  Component Value Date   WBC 8.2 06/18/2014   NEUTROABS 5.0 06/18/2014   HGB 11.9 06/18/2014   HCT 36.7 06/18/2014   MCV 86.4 06/18/2014   PLT 205 06/18/2014      Chemistry      Component Value Date/Time   NA 141 06/18/2014 0834   NA 140 04/11/2013 1828   K 4.1 06/18/2014 0834   K 4.1 04/11/2013 1828   CL 103 04/11/2013 1828   CL 105  06/17/2012 1055   CO2 27 06/18/2014 0834   CO2 27 04/11/2013 1828   BUN 17.6 06/18/2014 0834   BUN 15 04/11/2013 1828   CREATININE 1.1 06/18/2014 0834   CREATININE 0.82 04/11/2013 1828      Component Value Date/Time   CALCIUM 9.5 06/18/2014 0834   CALCIUM 9.4 04/11/2013 1828   ALKPHOS 86 06/18/2014 0834   ALKPHOS 83 04/11/2013 1828   AST 15 06/18/2014 0834   AST 22 04/11/2013 1828   ALT 16 06/18/2014 0834   ALT 22 04/11/2013 1828   BILITOT 0.30 06/18/2014 0834   BILITOT <0.2* 04/11/2013 1828     STUDIES:  Most recent bone density on 02/19/2013 was normal     CLINICAL DATA: Patient presents for second short-term followup of  probably benign right breast mass.  EXAM:  DIGITAL DIAGNOSTIC RIGHT MAMMOGRAM WITH CAD  ULTRASOUND RIGHT BREAST  COMPARISON: [facility]  ACR Breast Density Category b: There are scattered areas of  fibroglandular density.  FINDINGS:  Stable right breast parenchymal pattern.  Mammographic images were processed with CAD.  On physical exam, I palpate no discrete mass within right breast 2  o'clock position.  Ultrasound is performed, showing a 5 x 2 x 3 mm likely complicated  cyst within the right breast 2 o'clock position 3 cm from the  nipple, stable dating back to 04/10/2012 where it measured 7 x 4 x 8  mm.  IMPRESSION:  Stable likely benign right breast mass.  RECOMMENDATION:  Bilateral diagnostic mammography with right breast ultrasound in 1  year to demonstrate 2 years of stability.  I have discussed the findings and recommendations with the patient.  Results were also provided in writing at the conclusion of the  visit. If applicable, a reminder letter will be sent to the patient  regarding the next appointment.  BI-RADS CATEGORY 3: Probably benign.  Electronically Signed  By: Lovey Newcomer M.D.  On: 07/14/2013 13:28      ASSESSMENT: 69 y.o. [place] woman   (1)  status post left mastectomy and axillary lymph node dissection December  2011 for a T2 N44mc, stage IIA, invasive ductal carcinoma, grade 2.  Strongly ER/PR positive, HER2/neu negative with a borderline MIB-1.    (2)  Decided to forego chemotherapy and did not need postmastectomy radiation.    (3)  Accordingly, was started on letrozole in January 2012, the goal being to continue for total of 5 years (January 2017).  (4)  Small well-defined probable minimally complicated cyst at the 2 o'clock position of the right breast measuring 3 x 6 x 7 mm, being followed closely with diagnostic mammogram and ultrasound on a every 6 month basis.   PLAN:  Tonea continues to do well as far as her breast cancer is concerned. She is 4.5 years out from her definitive surgery with no evidence of recurrent disease. She is tolerating the letrozole well and will continue this drug until January 2017 to complete 5 years of antiestrogen therapy.   She is due for a mammogram next month, so I have placed these orders today. They will continue to monitor the stable right breast cyst as noted in her last mammogram records.   Pietrina will return in January for labs and a follow up visit with Dr. Jana Hakim. At this time she will be eligible to "graduate" from follow up visits. She understands and agrees with this plan. She knows the goal of treatment in her case is cure. She has been encouraged to call with any issues that might arise before her next visit here.   Elizabeth Panda, NP    06/25/2014

## 2014-06-25 NOTE — Telephone Encounter (Signed)
per pof ot sch pt appt-gave pt copy of sch-sch mamma

## 2014-07-16 ENCOUNTER — Ambulatory Visit
Admission: RE | Admit: 2014-07-16 | Discharge: 2014-07-16 | Disposition: A | Discharge status: Home / Self Care | Payer: Medicare Other | Source: Ambulatory Visit | Attending: Nurse Practitioner | Admitting: Nurse Practitioner

## 2014-07-16 ENCOUNTER — Ambulatory Visit
Admission: RE | Admit: 2014-07-16 | Discharge: 2014-07-16 | Disposition: A | Discharge status: Home / Self Care | Payer: Medicare Other | Source: Ambulatory Visit | Attending: Oncology | Admitting: Oncology

## 2014-07-16 DIAGNOSIS — C50912 Malignant neoplasm of unspecified site of left female breast: Secondary | ICD-10-CM

## 2014-07-16 DIAGNOSIS — N6001 Solitary cyst of right breast: Secondary | ICD-10-CM | POA: Diagnosis not present

## 2014-07-16 DIAGNOSIS — Z853 Personal history of malignant neoplasm of breast: Secondary | ICD-10-CM | POA: Diagnosis not present

## 2014-11-04 DIAGNOSIS — I509 Heart failure, unspecified: Secondary | ICD-10-CM | POA: Diagnosis not present

## 2014-11-04 DIAGNOSIS — G629 Polyneuropathy, unspecified: Secondary | ICD-10-CM | POA: Diagnosis not present

## 2014-11-04 DIAGNOSIS — I10 Essential (primary) hypertension: Secondary | ICD-10-CM | POA: Diagnosis not present

## 2014-11-04 DIAGNOSIS — F329 Major depressive disorder, single episode, unspecified: Secondary | ICD-10-CM | POA: Diagnosis not present

## 2014-11-04 DIAGNOSIS — E559 Vitamin D deficiency, unspecified: Secondary | ICD-10-CM | POA: Diagnosis not present

## 2014-11-04 DIAGNOSIS — Z0000 Encounter for general adult medical examination without abnormal findings: Secondary | ICD-10-CM | POA: Diagnosis not present

## 2014-11-04 DIAGNOSIS — E785 Hyperlipidemia, unspecified: Secondary | ICD-10-CM | POA: Diagnosis not present

## 2014-11-04 DIAGNOSIS — R7301 Impaired fasting glucose: Secondary | ICD-10-CM | POA: Diagnosis not present

## 2014-12-10 DIAGNOSIS — Z23 Encounter for immunization: Secondary | ICD-10-CM | POA: Diagnosis not present

## 2015-01-07 DIAGNOSIS — Z0000 Encounter for general adult medical examination without abnormal findings: Secondary | ICD-10-CM | POA: Diagnosis not present

## 2015-01-07 DIAGNOSIS — I10 Essential (primary) hypertension: Secondary | ICD-10-CM | POA: Diagnosis not present

## 2015-01-07 DIAGNOSIS — E559 Vitamin D deficiency, unspecified: Secondary | ICD-10-CM | POA: Diagnosis not present

## 2015-01-07 DIAGNOSIS — R7309 Other abnormal glucose: Secondary | ICD-10-CM | POA: Diagnosis not present

## 2015-01-07 DIAGNOSIS — F329 Major depressive disorder, single episode, unspecified: Secondary | ICD-10-CM | POA: Diagnosis not present

## 2015-01-07 DIAGNOSIS — G629 Polyneuropathy, unspecified: Secondary | ICD-10-CM | POA: Diagnosis not present

## 2015-01-07 DIAGNOSIS — E785 Hyperlipidemia, unspecified: Secondary | ICD-10-CM | POA: Diagnosis not present

## 2015-01-07 DIAGNOSIS — I509 Heart failure, unspecified: Secondary | ICD-10-CM | POA: Diagnosis not present

## 2015-01-07 DIAGNOSIS — J309 Allergic rhinitis, unspecified: Secondary | ICD-10-CM | POA: Diagnosis not present

## 2015-01-07 DIAGNOSIS — R7301 Impaired fasting glucose: Secondary | ICD-10-CM | POA: Diagnosis not present

## 2015-01-13 ENCOUNTER — Other Ambulatory Visit: Payer: Self-pay | Admitting: *Deleted

## 2015-01-13 DIAGNOSIS — C50912 Malignant neoplasm of unspecified site of left female breast: Secondary | ICD-10-CM

## 2015-01-14 ENCOUNTER — Other Ambulatory Visit (HOSPITAL_BASED_OUTPATIENT_CLINIC_OR_DEPARTMENT_OTHER): Payer: Medicare Other

## 2015-01-14 DIAGNOSIS — F329 Major depressive disorder, single episode, unspecified: Secondary | ICD-10-CM | POA: Diagnosis not present

## 2015-01-14 DIAGNOSIS — C50912 Malignant neoplasm of unspecified site of left female breast: Secondary | ICD-10-CM

## 2015-01-14 DIAGNOSIS — J309 Allergic rhinitis, unspecified: Secondary | ICD-10-CM | POA: Diagnosis not present

## 2015-01-14 DIAGNOSIS — I509 Heart failure, unspecified: Secondary | ICD-10-CM | POA: Diagnosis not present

## 2015-01-14 DIAGNOSIS — E785 Hyperlipidemia, unspecified: Secondary | ICD-10-CM | POA: Diagnosis not present

## 2015-01-14 DIAGNOSIS — I10 Essential (primary) hypertension: Secondary | ICD-10-CM | POA: Diagnosis not present

## 2015-01-14 DIAGNOSIS — E559 Vitamin D deficiency, unspecified: Secondary | ICD-10-CM | POA: Diagnosis not present

## 2015-01-14 DIAGNOSIS — G629 Polyneuropathy, unspecified: Secondary | ICD-10-CM | POA: Diagnosis not present

## 2015-01-14 LAB — COMPREHENSIVE METABOLIC PANEL (CC13)
ALT: 18 U/L (ref 0–55)
AST: 16 U/L (ref 5–34)
Albumin: 3.6 g/dL (ref 3.5–5.0)
Alkaline Phosphatase: 93 U/L (ref 40–150)
Anion Gap: 7 mEq/L (ref 3–11)
BUN: 18.8 mg/dL (ref 7.0–26.0)
CO2: 25 mEq/L (ref 22–29)
Calcium: 9.5 mg/dL (ref 8.4–10.4)
Chloride: 107 mEq/L (ref 98–109)
Creatinine: 1.1 mg/dL (ref 0.6–1.1)
EGFR: 62 mL/min/{1.73_m2} — ABNORMAL LOW (ref >90)
Glucose: 105 mg/dl (ref 70–140)
Potassium: 4.4 mEq/L (ref 3.5–5.1)
Sodium: 139 mEq/L (ref 136–145)
Total Bilirubin: <0.3 mg/dL (ref 0.20–1.20)
Total Protein: 6.8 g/dL (ref 6.4–8.3)

## 2015-01-14 LAB — CBC WITH DIFFERENTIAL/PLATELET
BASO%: 0.3 % (ref 0.0–2.0)
Basophils Absolute: 0 10*3/uL (ref 0.0–0.1)
EOS%: 4 % (ref 0.0–7.0)
Eosinophils Absolute: 0.3 10*3/uL (ref 0.0–0.5)
HCT: 36.1 % (ref 34.8–46.6)
HGB: 11.7 g/dL (ref 11.6–15.9)
LYMPH%: 29.5 % (ref 14.0–49.7)
MCH: 28.9 pg (ref 25.1–34.0)
MCHC: 32.4 g/dL (ref 31.5–36.0)
MCV: 89.1 fL (ref 79.5–101.0)
MONO#: 0.7 10*3/uL (ref 0.1–0.9)
MONO%: 9 % (ref 0.0–14.0)
NEUT#: 4.2 10*3/uL (ref 1.5–6.5)
NEUT%: 57.2 % (ref 38.4–76.8)
Platelets: 213 10*3/uL (ref 145–400)
RBC: 4.05 10*6/uL (ref 3.70–5.45)
RDW: 14.2 % (ref 11.2–14.5)
WBC: 7.3 10*3/uL (ref 3.9–10.3)
lymph#: 2.2 10*3/uL (ref 0.9–3.3)

## 2015-01-21 ENCOUNTER — Ambulatory Visit (HOSPITAL_BASED_OUTPATIENT_CLINIC_OR_DEPARTMENT_OTHER): Payer: Medicare Other | Admitting: Oncology

## 2015-01-21 VITALS — BP 119/62 | HR 67 | Temp 98.3°F | Resp 18 | Ht 59.0 in | Wt 206.2 lb

## 2015-01-21 DIAGNOSIS — C50912 Malignant neoplasm of unspecified site of left female breast: Secondary | ICD-10-CM | POA: Diagnosis not present

## 2015-01-21 NOTE — Progress Notes (Signed)
ID: Elizabeth [name]   DOB: [birth year]  MR#: [id number]  OTL#:[id number]  PCP: Benito Mccreedy, MD GYN:  SU:  OTHER MD: Juanita Craver, MD  CHIEF COMPLAINT:  Hx of Left Breast Cancer  CURRENT TREATMENT: Completing 5 years of Letrozole  BREAST CANCER HISTORY: From the original intake note:  The patient had been admitted to the [facility] she tells me, with a diagnosis of depression.  (I do not have those records.) While an in-patient, she says she noticed a lump in her left breast and brought it to medical attention.  Accordingly, on November 7th, 2011, she had bilateral digital mammography and left breast ultrasonography at the [facility].  In the left breast, at the site of a palpable lump, there was a focal density with ill-defined margins measuring approximately 2.1 cm.  By ultrasonography, this was a hypoechoic solid lesion measuring 1.9 cm with posterior acoustic shadowing and irregular margins.  Biopsy of the breast mass was recommended and performed on December 5th, 2011.  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  Hercept test was 1+ indicating no amplification.  The MIB-1 was borderline at 21.    With this information, the patient was referred to Dr. Mia Creek and on February 04, 2010, he proceeded to left sentinel lymph node sampling, this being positive for a micrometastatic deposit.  He then proceeded to left simple mastectomy with full axillary dissection.  An additional 10 lymph nodes were negative with tumor in this report ([id number]) measured 2.4 cm was described as Grade 2.  Margins were adequate, the closest being 0.5 cm.  There was no evidence of angiolymphatic invasion.    Her subsequent history is as detailed below.  INTERVAL HISTORY: Elizabeth [name] returns today for follow up of her estrogen receptor positive breast cancer, accompanied by  her daughter Elizabeth [name].  T continues on letrozole, with good tolerance. She does not complain of hot flashes or night sweats. She obtains a drug under good price.  REVIEW OF SYSTEMS: Judine is concerned that she keeps on gaining weight. She has some sinus symptoms including ear drainage, runny nose, and a dry cough. She worries about her irregular heartbeat. Occasionally her ankles swell. She can be short of breath when walking short distances. She sleeps on 4 pillows. She has heartburn and occasionally some nausea. She has joint pains here in there which are not more intense or persistent than before. She complains of forgetfulness, anxiety and depression. None of these problems are new. A detailed review of systems today was otherwise stable.  PAST MEDICAL HISTORY: Past Medical History  Diagnosis Date  . Breast CA     (Lt) breast ca dx 02/2010  . Hypertension   . Breast cancer 03/31/2011  . Nausea alone 12/17/2012  . GERD (gastroesophageal reflux disease) 12/17/2012  . Hypertension 12/17/2012  . Schizophrenia   . Depression   . Anxiety   Significant for history of depression + or - psychosis. I do not have a full psychiatric history and no documentation from her primary care physician at this point.  There is a history of high blood pressure, GERD, history of palpitation and history of seasonal allergies.  The patient is status post tonsillectomy and adenoidectomy.    PAST SURGICAL HISTORY: Past Surgical History  Procedure Laterality Date  . Breast surgery    . Tonsillectomy      FAMILY HISTORY No family history on  file. The patient's father died from lung cancer at the age of 62.  He was a smoker.  The patient's mother died from lung cancer as well at the age of 66.  She did not smoke but of course was exposed to secondhand smoke and also worked in a [employer].  The patient had 5 brothers and 7 sisters.  Two of the sisters had breast cancer, one diagnosed at the age of 69.  The  second sister, the patient does not know the age of diagnosis.  There is no history of ovarian cancer in the family and no other history of breast cancer to our knowledge.    GYNECOLOGIC HISTORY:  (Reviewed on 07/02/2013) She is GX P2, first pregnancy to term in her twenties.  She went through the change of life in her late 25s.  She never took hormone replacement.  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  She is divorced and lives alone in a senior citizen complex. This is not an assisted living situation.  She does her own medications, cooking, and all of her self care, but there are many group activities and she participates in them.  Her daughter, Elizabeth [name], directs marketing for a transportation company locally here in [place].  Elizabeth [name] has a daughter who is a Equities trader in high school.  The patient's other child, a son, has died.  The patient is not a church attender at present.     ADVANCED DIRECTIVES: in place  HEALTH MAINTENANCE: (updated 07/02/2013) Social History  Substance Use Topics  . Smoking status: Never Smoker   . Smokeless tobacco: Never Used  . Alcohol Use: No     Colonoscopy: Approx 2012, Dr. Collene Mares  PAP:  Not on file  Bone density: 02/19/2013, normal  Lipid panel: Not on file   No Known Allergies   Current Outpatient Prescriptions  Medication Sig Dispense Refill  . ARIPiprazole (ABILIFY) 15 MG tablet Take 15 mg by mouth daily.    Marland Kitchen aspirin 325 MG tablet Take 325 mg by mouth daily.    . clobetasol cream (TEMOVATE) 0.81 % Apply 1 application topically 2 (two) times daily.     . CRESTOR 10 MG tablet Take 10 mg by mouth daily.     . Cyanocobalamin (VITAMIN B12 PO) Take 1 capsule by mouth.    . docusate sodium (COLACE) 100 MG capsule Take 100 mg by mouth daily as needed.    Marland Kitchen escitalopram (LEXAPRO) 20 MG tablet Take 20 mg by mouth daily.    . fluticasone  (FLONASE) 50 MCG/ACT nasal spray     . letrozole (FEMARA) 2.5 MG tablet TAKE 1 TABLET BY MOUTH ONCE DAILY. 90 tablet 4  . lisinopril (PRINIVIL,ZESTRIL) 30 MG tablet Take 30 mg by mouth daily.     . Multiple Vitamin (MULTIVITAMIN WITH MINERALS) TABS tablet Take 1 tablet by mouth daily.    . psyllium (METAMUCIL) 58.6 % packet Take 1 packet by mouth as needed.    Marland Kitchen spironolactone (ALDACTONE) 25 MG tablet Take 25 mg by mouth daily.    . sucralfate (CARAFATE) 1 G tablet Take 1 g by mouth 4 (four) times daily.     No current facility-administered medications for this visit.    OBJECTIVE: Middle-aged [place] American woman in no acute distress Filed Vitals:   01/21/15 1552  BP: 119/62  Pulse: 67  Temp: 98.3  F (36.8 C)  Resp: 18     Body mass index is 41.63 kg/(m^2).    ECOG FS: 1 Filed Weights   01/21/15 1552  Weight: 206 lb 3.2 oz (93.532 kg)    Sclerae unicteric, EOMs intact Oropharynx clear, no thrush or other lesions No cervical or supraclavicular adenopathy Lungs no rales or rhonchi Heart regular rate and rhythm Abd soft, nontender, positive bowel sounds MSK no focal spinal tenderness, no upper extremity lymphedema Neuro: nonfocal, well oriented, appropriate affect Breasts: The right breast is unremarkable. The left breast is status post mastectomy. There is no evidence of chest wall recurrence. Left axilla is benign.   LAB RESULTS: Lab Results  Component Value Date   WBC 7.3 01/14/2015   NEUTROABS 4.2 01/14/2015   HGB 11.7 01/14/2015   HCT 36.1 01/14/2015   MCV 89.1 01/14/2015   PLT 213 01/14/2015      Chemistry      Component Value Date/Time   NA 139 01/14/2015 0839   NA 140 04/11/2013 1828   K 4.4 01/14/2015 0839   K 4.1 04/11/2013 1828   CL 103 04/11/2013 1828   CL 105 06/17/2012 1055   CO2 25 01/14/2015 0839   CO2 27 04/11/2013 1828   BUN 18.8 01/14/2015 0839   BUN 15 04/11/2013 1828   CREATININE 1.1 01/14/2015 0839   CREATININE 0.82 04/11/2013 1828       Component Value Date/Time   CALCIUM 9.5 01/14/2015 0839   CALCIUM 9.4 04/11/2013 1828   ALKPHOS 93 01/14/2015 0839   ALKPHOS 83 04/11/2013 1828   AST 16 01/14/2015 0839   AST 22 04/11/2013 1828   ALT 18 01/14/2015 0839   ALT 22 04/11/2013 1828   BILITOT <0.30 01/14/2015 0839   BILITOT <0.2* 04/11/2013 1828     STUDIES: CLINICAL DATA: Patient is a 69 year old female with a personal history of left breast cancer status post mastectomy in 2011. She presents for annual mammography and to complete short-term follow-up imaging of a probably benign cyst in the right breast. Today she reports a rash of the right areola. She states this has been evaluated by her physician.  EXAM: DIGITAL DIAGNOSTIC RIGHT MAMMOGRAM WITH 3D TOMOSYNTHESIS WITH CAD  ULTRASOUND RIGHT BREAST  COMPARISON: 01/27/2011 through 07/14/2013  ACR Breast Density Category b: There are scattered areas of fibroglandular density.  FINDINGS: Right unilateral digital diagnostic mammography demonstrates scattered fibroglandular densities. Scattered fibronodular densities with stable benign mammographic features are identified. No new mass or suspicious microcalcification. Spot-compression magnification view of the subareolar right breast in the CC projection reveals effacement of normal fibroglandular tissue and minimal vascular arterial wall calcification.  Mammographic images were processed with CAD.  On physical exam, no palpable masses are noted.  Targeted ultrasound is performed. This again demonstrates a minimally complicated cyst with benign sonographic features at 2 o'clock 3 cm from the nipple measuring approximately 5 x 2 x 3 mm.  IMPRESSION: No mammographic or sonographic findings of malignancy in the right breast.  RECOMMENDATION: Screening mammogram in one year.(Code:SM-B-01Y)  I have discussed the findings and recommendations with the patient and the importance of clinical  follow-up was. Results were also provided in writing at the conclusion of the visit. If applicable, a reminder letter will be sent to the patient regarding the next appointment.  BI-RADS CATEGORY 2: Benign.   Electronically Signed  By: Andres Shad  On: 07/16/2014 11:47     ASSESSMENT: 69 y.o. [place] woman   (1)  status post left  mastectomy and axillary lymph node dissection December 2011 for a T2 N50mc, stage IIA, invasive ductal carcinoma, grade 2.  Strongly ER/PR positive, HER2/neu negative with a borderline MIB-1.    (2)  Decided to forego chemotherapy and did not need postmastectomy radiation.    (3)  Accordingly, was started on letrozole in January 2012, the goal being to continue for total of 5 years (to January 2017).  (4)  Small well-defined probable minimally complicated cyst at the 2 o'clock position of the right breast measuring 3 x 6 x 7 mm, being followed closely with diagnostic mammogram and ultrasound on a every 6 month basis.  (a) unchanged [place]   PLAN:  SAvivais now 5 years out from her definitive surgery with no evidence of disease recurrence. This is very favorable.  She has about a month I have to go to complete 5 years on letrozole. While we have data now to continue letrozole for 10 years, the benefits are marginal. Specifically the decrease in distant disease recurrence is only 1%. Given the fact that letrozole is not side effects and complications free, both the patient and I are comfortable discontinuing letrozole when she runs out of her current supply of medication  As far as breast cancer follow-up is concerned all she needs is yearly mammography and yearly physician breast exam. She has completed the short-term follow-up protocol for what is almost certainly a benign cyst in the right breast.  We did discuss our new survivorship program and she is very interested in participating. I went ahead and made her a return  appointment with our breast survivorship nurse practitioner for the coming spring  I will be glad to see the patient at any point in the future if I when the need arises, but as of now we're making no further routine appointments for her with me here.    MChauncey Cruel MD    01/21/2015

## 2015-02-05 DIAGNOSIS — I10 Essential (primary) hypertension: Secondary | ICD-10-CM | POA: Diagnosis not present

## 2015-02-12 DIAGNOSIS — F331 Major depressive disorder, recurrent, moderate: Secondary | ICD-10-CM | POA: Diagnosis not present

## 2015-02-16 DIAGNOSIS — F23 Brief psychotic disorder: Secondary | ICD-10-CM | POA: Diagnosis not present

## 2015-02-16 DIAGNOSIS — F331 Major depressive disorder, recurrent, moderate: Secondary | ICD-10-CM | POA: Diagnosis not present

## 2015-02-19 DIAGNOSIS — E559 Vitamin D deficiency, unspecified: Secondary | ICD-10-CM | POA: Diagnosis not present

## 2015-02-19 DIAGNOSIS — E785 Hyperlipidemia, unspecified: Secondary | ICD-10-CM | POA: Diagnosis not present

## 2015-02-19 DIAGNOSIS — F329 Major depressive disorder, single episode, unspecified: Secondary | ICD-10-CM | POA: Diagnosis not present

## 2015-02-19 DIAGNOSIS — I509 Heart failure, unspecified: Secondary | ICD-10-CM | POA: Diagnosis not present

## 2015-02-19 DIAGNOSIS — G629 Polyneuropathy, unspecified: Secondary | ICD-10-CM | POA: Diagnosis not present

## 2015-02-19 DIAGNOSIS — I10 Essential (primary) hypertension: Secondary | ICD-10-CM | POA: Diagnosis not present

## 2015-02-19 DIAGNOSIS — R7301 Impaired fasting glucose: Secondary | ICD-10-CM | POA: Diagnosis not present

## 2015-02-19 DIAGNOSIS — J309 Allergic rhinitis, unspecified: Secondary | ICD-10-CM | POA: Diagnosis not present

## 2015-03-03 DIAGNOSIS — F331 Major depressive disorder, recurrent, moderate: Secondary | ICD-10-CM | POA: Diagnosis not present

## 2015-03-16 DIAGNOSIS — F331 Major depressive disorder, recurrent, moderate: Secondary | ICD-10-CM | POA: Diagnosis not present

## 2015-03-18 ENCOUNTER — Ambulatory Visit (HOSPITAL_BASED_OUTPATIENT_CLINIC_OR_DEPARTMENT_OTHER): Payer: Medicare Other | Admitting: Oncology

## 2015-03-18 ENCOUNTER — Ambulatory Visit: Payer: Self-pay | Admitting: Oncology

## 2015-03-18 ENCOUNTER — Other Ambulatory Visit (HOSPITAL_BASED_OUTPATIENT_CLINIC_OR_DEPARTMENT_OTHER): Payer: Medicare Other

## 2015-03-18 ENCOUNTER — Other Ambulatory Visit: Payer: Self-pay

## 2015-03-18 VITALS — BP 117/53 | HR 58 | Temp 97.6°F | Resp 18 | Ht 59.0 in | Wt 201.0 lb

## 2015-03-18 DIAGNOSIS — Z853 Personal history of malignant neoplasm of breast: Secondary | ICD-10-CM

## 2015-03-18 DIAGNOSIS — C50112 Malignant neoplasm of central portion of left female breast: Secondary | ICD-10-CM

## 2015-03-18 DIAGNOSIS — C50912 Malignant neoplasm of unspecified site of left female breast: Secondary | ICD-10-CM

## 2015-03-18 LAB — COMPREHENSIVE METABOLIC PANEL
ALT: 17 U/L (ref 0–55)
AST: 17 U/L (ref 5–34)
Albumin: 4 g/dL (ref 3.5–5.0)
Alkaline Phosphatase: 90 U/L (ref 40–150)
Anion Gap: 8 mEq/L (ref 3–11)
BUN: 17.7 mg/dL (ref 7.0–26.0)
CO2: 26 mEq/L (ref 22–29)
Calcium: 9.7 mg/dL (ref 8.4–10.4)
Chloride: 107 mEq/L (ref 98–109)
Creatinine: 1.1 mg/dL (ref 0.6–1.1)
EGFR: 57 mL/min/{1.73_m2} — ABNORMAL LOW (ref >90)
Glucose: 100 mg/dl (ref 70–140)
Potassium: 4.8 mEq/L (ref 3.5–5.1)
Sodium: 140 mEq/L (ref 136–145)
Total Bilirubin: 0.31 mg/dL (ref 0.20–1.20)
Total Protein: 7.5 g/dL (ref 6.4–8.3)

## 2015-03-18 LAB — CBC WITH DIFFERENTIAL/PLATELET
BASO%: 0.3 % (ref 0.0–2.0)
Basophils Absolute: 0 10*3/uL (ref 0.0–0.1)
EOS%: 1.9 % (ref 0.0–7.0)
Eosinophils Absolute: 0.1 10*3/uL (ref 0.0–0.5)
HCT: 35.9 % (ref 34.8–46.6)
HGB: 11.8 g/dL (ref 11.6–15.9)
LYMPH%: 32.4 % (ref 14.0–49.7)
MCH: 28.8 pg (ref 25.1–34.0)
MCHC: 32.9 g/dL (ref 31.5–36.0)
MCV: 87.6 fL (ref 79.5–101.0)
MONO#: 0.6 10*3/uL (ref 0.1–0.9)
MONO%: 7.5 % (ref 0.0–14.0)
NEUT#: 4.2 10*3/uL (ref 1.5–6.5)
NEUT%: 57.9 % (ref 38.4–76.8)
Platelets: 215 10*3/uL (ref 145–400)
RBC: 4.1 10*6/uL (ref 3.70–5.45)
RDW: 14.3 % (ref 11.2–14.5)
WBC: 7.3 10*3/uL (ref 3.9–10.3)
lymph#: 2.4 10*3/uL (ref 0.9–3.3)

## 2015-03-18 NOTE — Progress Notes (Signed)
ID: Elizabeth [name]   DOB: [birth year]  MR#: [id number]  MEB#:[id number]  PCP: Elizabeth Mccreedy, MD GYN:  SU:  OTHER MD: Elizabeth Craver, MD  CHIEF COMPLAINT:  Hx of Left Breast Cancer  CURRENT TREATMENT: Completing 5 years of Letrozole  BREAST CANCER HISTORY: From the original intake note:  The patient had been admitted to the [facility], she tells me, with a diagnosis of depression.  (I do not have those records.) While an in-patient, she says she noticed a lump in her left breast and brought it to medical attention.  Accordingly, on November 7th, 2011, she had bilateral digital mammography and left breast ultrasonography at the [facility].  In the left breast, at the site of a palpable lump, there was a focal density with ill-defined margins measuring approximately 2.1 cm.  By ultrasonography, this was a hypoechoic solid lesion measuring 1.9 cm with posterior acoustic shadowing and irregular margins.  Biopsy of the breast mass was recommended and performed on December 5th, 2011.  The report ([id number]) at [facility] 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  Hercept test was 1+ indicating no amplification.  The MIB-1 was borderline at 21.    With this information, the patient was referred to Dr. Mia [name] and on February 04, 2010, he proceeded to left sentinel lymph node sampling, this being positive for a micrometastatic deposit.  He then proceeded to left simple mastectomy with full axillary dissection.  An additional 10 lymph nodes were negative with tumor in this report ([id number]) measured 2.4 cm was described as Grade 2.  Margins were adequate, the closest being 0.5 cm.  There was no evidence of angiolymphatic invasion.    Her subsequent history is as detailed below.  INTERVAL HISTORY: Elizabeth [name] returns today for follow up of her early stage breast cancer, accompanied by her daughter  Elizabeth [name].  Interval history is generally unremarkable. She tells me she exercises on a bike and also there is an exercise club near where she lives  REVIEW OF SYSTEMS: Elizabeth [name]'s worst problem she says is her rash. Dr. Jeannine [name] is taking care of this with clobetasol. It is getting better, she thinks. She has some seasonal allergies problems, and of course she has pain in both knees and in her back which is long-standing. She wonders if this is related to her Crestor but it seems unlikely to me. She has occasional hot flashes. Otherwise a detailed review of systems is unremarkable  PAST MEDICAL HISTORY: Past Medical History  Diagnosis Date  . Breast CA     (Lt) breast ca dx 02/2010  . Hypertension   . Breast cancer 03/31/2011  . Nausea alone 12/17/2012  . GERD (gastroesophageal reflux disease) 12/17/2012  . Hypertension 12/17/2012  . Schizophrenia   . Depression   . Anxiety   Significant for history of depression + or - psychosis. I do not have a full psychiatric history and no documentation from her primary care physician at this point.  There is a history of high blood pressure, GERD, history of palpitation and history of seasonal allergies.  The patient is status post tonsillectomy and adenoidectomy.    PAST SURGICAL HISTORY: Past Surgical History  Procedure Laterality Date  . Breast surgery    . Tonsillectomy      FAMILY HISTORY No family history on file. The patient's father died from lung cancer at the age of 63.  He was a smoker.  The patient's  mother died from lung cancer as well at the age of 29.  She did not smoke but of course was exposed to secondhand smoke and also worked in a [employer].  The patient had 5 brothers and 7 sisters.  Two of the sisters had breast cancer, one diagnosed at the age of 66.  The second sister, the patient does not know the age of diagnosis.  There is no history of ovarian cancer in the family and no other history of breast cancer to our knowledge.     GYNECOLOGIC HISTORY:  (Reviewed on 07/02/2013) She is GX P2, first pregnancy to term in her twenties.  She went through the change of life in her late 42s.  She never took hormone replacement.  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  She is divorced and lives alone in a senior citizen complex. This is not an assisted living situation.  She does her own medications, cooking, and all of her self care, but there are many group activities and she participates in them.  Her daughter, Elizabeth [name], directs marketing for a transportation company locally here in [place].  Elizabeth [name] has a daughter who is a Equities trader in high school.  The patient's other child, a son, has died.  The patient is not a church attender at present.     ADVANCED DIRECTIVES: in place  HEALTH MAINTENANCE: (updated 07/02/2013) Social History  Substance Use Topics  . Smoking status: Never Smoker   . Smokeless tobacco: Never Used  . Alcohol Use: No     Colonoscopy: Approx 2012, Dr. Collene [name]  PAP:  Not on file  Bone density: 02/19/2013, normal  Lipid panel: Not on file   No Known Allergies   Current Outpatient Prescriptions  Medication Sig Dispense Refill  . ARIPiprazole (ABILIFY) 15 MG tablet Take 15 mg by mouth daily.    Marland Kitchen aspirin 325 MG tablet Take 325 mg by mouth daily.    . clobetasol cream (TEMOVATE) 2.77 % Apply 1 application topically 2 (two) times daily.     . CRESTOR 10 MG tablet Take 10 mg by mouth daily.     . Cyanocobalamin (VITAMIN B12 PO) Take 1 capsule by mouth.    . docusate sodium (COLACE) 100 MG capsule Take 100 mg by mouth daily as needed.    Marland Kitchen escitalopram (LEXAPRO) 20 MG tablet Take 20 mg by mouth daily.    . fluticasone (FLONASE) 50 MCG/ACT nasal spray     . lisinopril (PRINIVIL,ZESTRIL) 30 MG tablet Take 30 mg by mouth daily.     . Multiple Vitamin (MULTIVITAMIN WITH MINERALS) TABS  tablet Take 1 tablet by mouth daily.    . psyllium (METAMUCIL) 58.6 % packet Take 1 packet by mouth as needed.    Marland Kitchen spironolactone (ALDACTONE) 25 MG tablet Take 25 mg by mouth daily.    . sucralfate (CARAFATE) 1 G tablet Take 1 g by mouth 4 (four) times daily.     No current facility-administered medications for this visit.    OBJECTIVE: Middle-aged [place] American woman who appears stated age 70 Vitals:   03/18/15 1509  BP: 117/53  Pulse: 58  Temp: 97.6 F (36.4 C)  Resp: 18     Body mass index is 40.58 kg/(m^2).    ECOG FS: 1 Filed Weights   03/18/15 1509  Weight: 201 lb (91.173 kg)    Sclerae  unicteric, EOMs intact Oropharynx clear and moist No cervical or supraclavicular adenopathy Lungs no rales or rhonchi Heart regular rate and rhythm Abd soft, obese, nontender, positive bowel sounds MSK no focal spinal tenderness Neuro: nonfocal, well oriented, appropriate affect Breasts:  the right breast is unremarkable. The left breast is status post mastectomy. There is no evidence of chest wall recurrence. The left axilla is benign    LAB RESULTS: Lab Results  Component Value Date   WBC 7.3 03/18/2015   NEUTROABS 4.2 03/18/2015   HGB 11.8 03/18/2015   HCT 35.9 03/18/2015   MCV 87.6 03/18/2015   PLT 215 03/18/2015      Chemistry      Component Value Date/Time   NA 139 01/14/2015 0839   NA 140 04/11/2013 1828   K 4.4 01/14/2015 0839   K 4.1 04/11/2013 1828   CL 103 04/11/2013 1828   CL 105 06/17/2012 1055   CO2 25 01/14/2015 0839   CO2 27 04/11/2013 1828   BUN 18.8 01/14/2015 0839   BUN 15 04/11/2013 1828   CREATININE 1.1 01/14/2015 0839   CREATININE 0.82 04/11/2013 1828      Component Value Date/Time   CALCIUM 9.5 01/14/2015 0839   CALCIUM 9.4 04/11/2013 1828   ALKPHOS 93 01/14/2015 0839   ALKPHOS 83 04/11/2013 1828   AST 16 01/14/2015 0839   AST 22 04/11/2013 1828   ALT 18 01/14/2015 0839   ALT 22 04/11/2013 1828   BILITOT <0.30 01/14/2015 0839    BILITOT <0.2* 04/11/2013 1828     STUDIES: CLINICAL DATA: Patient is a 70 year old female with a personal history of left breast cancer status post mastectomy in 2011. She presents for annual mammography and to complete short-term follow-up imaging of a probably benign cyst in the right breast. Today she reports a rash of the right areola. She states this has been evaluated by her physician.  EXAM: DIGITAL DIAGNOSTIC RIGHT MAMMOGRAM WITH 3D TOMOSYNTHESIS WITH CAD  ULTRASOUND RIGHT BREAST  COMPARISON: 01/27/2011 through 07/14/2013  ACR Breast Density Category b: There are scattered areas of fibroglandular density.  FINDINGS: Right unilateral digital diagnostic mammography demonstrates scattered fibroglandular densities. Scattered fibronodular densities with stable benign mammographic features are identified. No new mass or suspicious microcalcification. Spot-compression magnification view of the subareolar right breast in the CC projection reveals effacement of normal fibroglandular tissue and minimal vascular arterial wall calcification.  Mammographic images were processed with CAD.  On physical exam, no palpable masses are noted.  Targeted ultrasound is performed. This again demonstrates a minimally complicated cyst with benign sonographic features at 2 o'clock 3 cm from the nipple measuring approximately 5 x 2 x 3 mm.  IMPRESSION: No mammographic or sonographic findings of malignancy in the right breast.  RECOMMENDATION: Screening mammogram in one year.(Code:SM-B-01Y)  I have discussed the findings and recommendations with the patient and the importance of clinical follow-up was. Results were also provided in writing at the conclusion of the visit. If applicable, a reminder letter will be sent to the patient regarding the next appointment.  BI-RADS CATEGORY 2: Benign.   Electronically Signed  By: Andres Shad  On: 07/16/2014  11:47     ASSESSMENT: 70 y.o. [place] woman   (1)  status post left mastectomy and axillary lymph node dissection December 2011 for a T2 N21mc, stage IIA, invasive ductal carcinoma, grade 2.  Strongly ER/PR positive, HER2/neu negative with a borderline MIB-1.    (2)  Decided to forego chemotherapy and did not need postmastectomy  radiation.    (3)  Accordingly, was started on letrozole in January 2012, the goal being to continue for total of 5 years (to January 2017).  (4)  Small well-defined probable minimally complicated cyst at the 2 o'clock position of the right breast measuring 3 x 6 x 7 mm, being followed closely with diagnostic mammogram and ultrasound on a every 6 month basis.  (a) unchanged on mammography/ [place] 07/16/2014   PLAN:  Logyn is now over 5 years out from her definitive surgery with no evidence of disease recurrence. This is very favorable.  she completed her 5 years of antiestrogen and at this point I am comfortable releasing her to her primary care physician.  However she has some reluctance because she feels reassured coming here once a year and being "looked over a little bit extra".  Accordingly we are making an appointment for her with our breast survivorship nurse practitioner, who willsee her in May, after her mammography.  All sSparkie needs in terms of breast screening is yearly right mammography and yearly chest wall and breast exam by a physician or nurse practitioner.  I will be glad to see her again at any point in the future but as of now we are making no further routine appointments for her with me here.    Chauncey Cruel, MD    03/18/2015

## 2015-04-06 DIAGNOSIS — F251 Schizoaffective disorder, depressive type: Secondary | ICD-10-CM | POA: Diagnosis not present

## 2015-04-08 ENCOUNTER — Telehealth: Payer: Self-pay | Admitting: Oncology

## 2015-04-08 NOTE — Telephone Encounter (Signed)
FAX LABS TO [facility] [phone] RELEASE ID)

## 2015-04-14 DIAGNOSIS — F251 Schizoaffective disorder, depressive type: Secondary | ICD-10-CM | POA: Diagnosis not present

## 2015-05-04 DIAGNOSIS — I10 Essential (primary) hypertension: Secondary | ICD-10-CM | POA: Diagnosis not present

## 2015-05-04 DIAGNOSIS — E559 Vitamin D deficiency, unspecified: Secondary | ICD-10-CM | POA: Diagnosis not present

## 2015-05-07 DIAGNOSIS — E785 Hyperlipidemia, unspecified: Secondary | ICD-10-CM | POA: Diagnosis not present

## 2015-05-07 DIAGNOSIS — G629 Polyneuropathy, unspecified: Secondary | ICD-10-CM | POA: Diagnosis not present

## 2015-05-07 DIAGNOSIS — E559 Vitamin D deficiency, unspecified: Secondary | ICD-10-CM | POA: Diagnosis not present

## 2015-05-07 DIAGNOSIS — Z136 Encounter for screening for cardiovascular disorders: Secondary | ICD-10-CM | POA: Diagnosis not present

## 2015-05-07 DIAGNOSIS — Z131 Encounter for screening for diabetes mellitus: Secondary | ICD-10-CM | POA: Diagnosis not present

## 2015-05-07 DIAGNOSIS — Z0000 Encounter for general adult medical examination without abnormal findings: Secondary | ICD-10-CM | POA: Diagnosis not present

## 2015-05-07 DIAGNOSIS — I509 Heart failure, unspecified: Secondary | ICD-10-CM | POA: Diagnosis not present

## 2015-05-07 DIAGNOSIS — Z1389 Encounter for screening for other disorder: Secondary | ICD-10-CM | POA: Diagnosis not present

## 2015-05-07 DIAGNOSIS — Z0100 Encounter for examination of eyes and vision without abnormal findings: Secondary | ICD-10-CM | POA: Diagnosis not present

## 2015-05-07 DIAGNOSIS — Z01118 Encounter for examination of ears and hearing with other abnormal findings: Secondary | ICD-10-CM | POA: Diagnosis not present

## 2015-05-07 DIAGNOSIS — I10 Essential (primary) hypertension: Secondary | ICD-10-CM | POA: Diagnosis not present

## 2015-05-11 DIAGNOSIS — F251 Schizoaffective disorder, depressive type: Secondary | ICD-10-CM | POA: Diagnosis not present

## 2015-06-03 DIAGNOSIS — E559 Vitamin D deficiency, unspecified: Secondary | ICD-10-CM | POA: Diagnosis not present

## 2015-06-03 DIAGNOSIS — I10 Essential (primary) hypertension: Secondary | ICD-10-CM | POA: Diagnosis not present

## 2015-06-08 DIAGNOSIS — F251 Schizoaffective disorder, depressive type: Secondary | ICD-10-CM | POA: Diagnosis not present

## 2015-06-11 DIAGNOSIS — E785 Hyperlipidemia, unspecified: Secondary | ICD-10-CM | POA: Diagnosis not present

## 2015-06-11 DIAGNOSIS — I10 Essential (primary) hypertension: Secondary | ICD-10-CM | POA: Diagnosis not present

## 2015-06-11 DIAGNOSIS — R739 Hyperglycemia, unspecified: Secondary | ICD-10-CM | POA: Diagnosis not present

## 2015-06-11 DIAGNOSIS — I509 Heart failure, unspecified: Secondary | ICD-10-CM | POA: Diagnosis not present

## 2015-06-11 DIAGNOSIS — E559 Vitamin D deficiency, unspecified: Secondary | ICD-10-CM | POA: Diagnosis not present

## 2015-06-11 DIAGNOSIS — G629 Polyneuropathy, unspecified: Secondary | ICD-10-CM | POA: Diagnosis not present

## 2015-06-16 ENCOUNTER — Telehealth: Payer: Self-pay | Admitting: Nurse Practitioner

## 2015-06-16 ENCOUNTER — Other Ambulatory Visit: Payer: Self-pay | Admitting: Nurse Practitioner

## 2015-06-16 DIAGNOSIS — Z1231 Encounter for screening mammogram for malignant neoplasm of breast: Secondary | ICD-10-CM

## 2015-06-16 DIAGNOSIS — C50112 Malignant neoplasm of central portion of left female breast: Secondary | ICD-10-CM

## 2015-06-16 NOTE — Telephone Encounter (Signed)
Called and spoke with patient.  In reviewing pt's chart, appointment in Survivorship program was to have been scheduled for May 2017 following her annual mammogram.  Pt has not yet had mammogram. Doing well, denies complaints.  Will cancel appointment for 06/17/2015 and move to May 2017 following obtaining of mammogram.  Pt aware and asked that I call and discuss with [name], her daughter.  Called and spoke with [place], as well.  Orders entered for mammogram and POF to reschedule visit for date following mammogram in May 2017.  Neither pt nor pt's daughter with questions at this time.

## 2015-06-17 ENCOUNTER — Encounter: Payer: Medicare Other | Admitting: Nurse Practitioner

## 2015-06-17 DIAGNOSIS — I10 Essential (primary) hypertension: Secondary | ICD-10-CM | POA: Diagnosis not present

## 2015-06-17 DIAGNOSIS — E559 Vitamin D deficiency, unspecified: Secondary | ICD-10-CM | POA: Diagnosis not present

## 2015-07-05 DIAGNOSIS — F251 Schizoaffective disorder, depressive type: Secondary | ICD-10-CM | POA: Diagnosis not present

## 2015-07-20 ENCOUNTER — Ambulatory Visit
Admission: RE | Admit: 2015-07-20 | Discharge: 2015-07-20 | Disposition: A | Discharge status: Home / Self Care | Payer: Medicare Other | Source: Ambulatory Visit | Attending: Nurse Practitioner | Admitting: Nurse Practitioner

## 2015-07-20 DIAGNOSIS — Z1231 Encounter for screening mammogram for malignant neoplasm of breast: Secondary | ICD-10-CM

## 2015-07-21 ENCOUNTER — Other Ambulatory Visit: Payer: Self-pay | Admitting: Nurse Practitioner

## 2015-07-21 DIAGNOSIS — R928 Other abnormal and inconclusive findings on diagnostic imaging of breast: Secondary | ICD-10-CM

## 2015-07-22 ENCOUNTER — Telehealth: Payer: Self-pay | Admitting: Nurse Practitioner

## 2015-07-22 DIAGNOSIS — I10 Essential (primary) hypertension: Secondary | ICD-10-CM | POA: Diagnosis not present

## 2015-07-22 DIAGNOSIS — E559 Vitamin D deficiency, unspecified: Secondary | ICD-10-CM | POA: Diagnosis not present

## 2015-07-22 NOTE — Telephone Encounter (Signed)
Called and spoke with both Elizabeth [name] and her daughter, Elizabeth [name], about screening mammogram results.  Appointment made for diagnostic imaging 5/25/217.  Will await those results prior to further disposition, including scheduling of follow up appointment.  Ms. [name] and Elizabeth [name] without questions at this time.

## 2015-07-28 ENCOUNTER — Other Ambulatory Visit: Payer: Medicare Other

## 2015-07-28 DIAGNOSIS — F251 Schizoaffective disorder, depressive type: Secondary | ICD-10-CM | POA: Diagnosis not present

## 2015-08-02 DIAGNOSIS — F251 Schizoaffective disorder, depressive type: Secondary | ICD-10-CM | POA: Diagnosis not present

## 2015-08-04 ENCOUNTER — Ambulatory Visit
Admission: RE | Admit: 2015-08-04 | Discharge: 2015-08-04 | Disposition: A | Discharge status: Home / Self Care | Payer: Medicare Other | Source: Ambulatory Visit | Attending: Nurse Practitioner | Admitting: Nurse Practitioner

## 2015-08-04 ENCOUNTER — Other Ambulatory Visit: Payer: Self-pay | Admitting: Nurse Practitioner

## 2015-08-04 DIAGNOSIS — R928 Other abnormal and inconclusive findings on diagnostic imaging of breast: Secondary | ICD-10-CM

## 2015-08-04 DIAGNOSIS — N6310 Unspecified lump in the right breast, unspecified quadrant: Secondary | ICD-10-CM

## 2015-08-04 DIAGNOSIS — N63 Unspecified lump in breast: Secondary | ICD-10-CM | POA: Diagnosis not present

## 2015-08-05 ENCOUNTER — Telehealth: Payer: Self-pay | Admitting: Nurse Practitioner

## 2015-08-05 DIAGNOSIS — E559 Vitamin D deficiency, unspecified: Secondary | ICD-10-CM | POA: Diagnosis not present

## 2015-08-05 DIAGNOSIS — I10 Essential (primary) hypertension: Secondary | ICD-10-CM | POA: Diagnosis not present

## 2015-08-05 NOTE — Telephone Encounter (Signed)
Called and spoke with patient's daughter (at patient's previous request) regarding dx imaging.  Scheduled for bx on 08/10/15. Will await results and follow up as appropriate.  Pt's daughter without questions at this time.

## 2015-08-10 ENCOUNTER — Ambulatory Visit
Admission: RE | Admit: 2015-08-10 | Discharge: 2015-08-10 | Disposition: A | Discharge status: Home / Self Care | Payer: Medicare Other | Source: Ambulatory Visit | Attending: Nurse Practitioner | Admitting: Nurse Practitioner

## 2015-08-10 ENCOUNTER — Other Ambulatory Visit: Payer: Self-pay | Admitting: Nurse Practitioner

## 2015-08-10 DIAGNOSIS — N6310 Unspecified lump in the right breast, unspecified quadrant: Secondary | ICD-10-CM

## 2015-08-11 ENCOUNTER — Other Ambulatory Visit: Payer: Self-pay | Admitting: Nurse Practitioner

## 2015-08-23 ENCOUNTER — Ambulatory Visit (HOSPITAL_BASED_OUTPATIENT_CLINIC_OR_DEPARTMENT_OTHER): Payer: Medicare Other | Admitting: Nurse Practitioner

## 2015-08-23 ENCOUNTER — Encounter: Payer: Self-pay | Admitting: Nurse Practitioner

## 2015-08-23 VITALS — BP 127/49 | HR 69 | Temp 97.7°F | Resp 19 | Ht 59.0 in | Wt 210.1 lb

## 2015-08-23 DIAGNOSIS — Z170 Estrogen receptor positive status [ER+]: Secondary | ICD-10-CM

## 2015-08-23 DIAGNOSIS — C50912 Malignant neoplasm of unspecified site of left female breast: Secondary | ICD-10-CM

## 2015-08-23 DIAGNOSIS — Z79811 Long term (current) use of aromatase inhibitors: Secondary | ICD-10-CM

## 2015-08-23 DIAGNOSIS — C50112 Malignant neoplasm of central portion of left female breast: Secondary | ICD-10-CM

## 2015-08-23 NOTE — Progress Notes (Signed)
CLINIC:  Cancer Survivorship   REASON FOR VISIT:  Routine follow-up post-treatment for history of breast cancer.  BRIEF ONCOLOGIC HISTORY:    Breast cancer, left breast ([place])   02/2010 Definitive Surgery Left mastectomy/ALND: invasive ductal carcinoma, grade 2, ER/PR+, HER2/neu negative, k167 21%   02/2010 Pathologic Stage Stage IIA: T2 N50mc    Chemotherapy declined   03/2010 -  Anti-estrogen oral therapy Letrozole 2.5 mg; planned duration of 5 years of therapy    Breast [place] well-defined probable minimally complicated cyst at the 2 o'clock position of the right breast measuring 3 x 6 x 7 mm, being followed closely with diagnostic mammogram and ultrasound on a every 6 month basis.    INTERVAL HISTORY:  Ms. AMcmackinpresents to the Survivorship Clinic today for ongoing follow up regarding her history of breast cancer. Overall, Ms. AIldefonsoreports doing well since her last visit.  She underwent diagnostic mammogram last month with a suspected mass and was scheduled for a biopsy.  This was later canceled when the mass could not be identified with repeat imaging recommended in six months.  She is scheduled for this already.  She denies any palpable mass or lesion.  She denies any headache, cough, shortness of breath, or bone pain. She reports a good appetite and denies any weight loss.  She has reported some occasional unsteadiness.  She has a dry cough, which is worse at night, that she attributes to her allergies.    REVIEW OF SYSTEMS:  General: Denies fever, chills, unintentional weight loss, or generalized fatigue.  HEENT: Wears glasses.  Occasional runny nose and sinus problems, which she believes contributes to her cough. Denies visual changes, hearing loss, mouth sores, or difficulty swallowing. Cardiac: Denies palpitations and lower extremity edema.  Respiratory: Dry cough at night.  Denies wheeze or dyspnea on exertion.  Breast: As above.  GI: Denies abdominal pain, constipation,  diarrhea, nausea, or vomiting.  GU: Denies dysuria, hematuria, vaginal bleeding, vaginal discharge, or vaginal dryness.  Musculoskeletal: As above. Neuro: As above.  Skin: Denies rash, pruritis, or open wounds.  Psych: Denies depression, anxiety, insomnia, or memory loss.   A 14-point review of systems was completed and was negative, except as noted above.     PAST MEDICAL/SURGICAL HISTORY:  Past Medical History  Diagnosis Date  . Breast CA ([place]     (Lt) breast ca dx 02/2010  . Hypertension   . Breast cancer ([facility] 03/31/2011  . Nausea alone 12/17/2012  . GERD (gastroesophageal reflux disease) 12/17/2012  . Hypertension 12/17/2012  . Schizophrenia ([facility]   . Depression   . Anxiety    Past Surgical History  Procedure Laterality Date  . Breast surgery    . Tonsillectomy       ALLERGIES:  No Known Allergies   CURRENT MEDICATIONS:  Current Outpatient Prescriptions on File Prior to Visit  Medication Sig Dispense Refill  . ARIPiprazole (ABILIFY) 15 MG tablet Inject 15 mg into the muscle every 30 (thirty) days.     .Marland Kitchenaspirin 325 MG tablet Take 325 mg by mouth daily.    . clobetasol cream (TEMOVATE) 05.63% Apply 1 application topically 2 (two) times daily.     . CRESTOR 10 MG tablet Take 10 mg by mouth daily.     . Cyanocobalamin (VITAMIN B12 PO) Take 1 capsule by mouth.    . escitalopram (LEXAPRO) 20 MG tablet Take 20 mg by mouth daily.    . fluticasone (FLONASE) 50 MCG/ACT nasal  spray     . lisinopril (PRINIVIL,ZESTRIL) 30 MG tablet Take 30 mg by mouth daily.     . Multiple Vitamin (MULTIVITAMIN WITH MINERALS) TABS tablet Take 1 tablet by mouth daily.    Marland Kitchen spironolactone (ALDACTONE) 25 MG tablet Take 25 mg by mouth daily.    . sucralfate (CARAFATE) 1 G tablet Take 1 g by mouth 4 (four) times daily.     No current facility-administered medications on file prior to visit.     ONCOLOGIC FAMILY HISTORY:  No family history on file.   GENETIC COUNSELING/TESTING: No    SOCIAL HISTORY:  Elizabeth [name] is single and lives alone in [place], [place].  She has 1 child.  Elizabeth [name] is currently retired.  She denies any current or history of tobacco, alcohol, or illicit drug use.     PHYSICAL EXAMINATION:  Vital Signs: Filed Vitals:   08/23/15 1639  BP: 127/49  Pulse: 69  Temp: 97.7 F (36.5 C)  Resp: 19    ECOG performance status: 1 General: Well-nourished, well-appearing female in no acute distress.  She is accompanied in clinic by her daughter, Elizabeth [name], today.   HEENT: Head is atraumatic and normocephalic.  Pupils equal and reactive to light and accomodation. Conjunctivae clear without exudate.  Sclerae anicteric. Oral mucosa is pink, moist, and intact without lesions.  Oropharynx is pink without lesions or erythema.  Lymph: No cervical, supraclavicular, infraclavicular, or axillary lymphadenopathy noted on palpation.  Cardiovascular: Regular rate and rhythm without murmurs, rubs, or gallops. Respiratory: Clear to auscultation bilaterally. Chest expansion symmetric without accessory muscle use on inspiration or expiration.  Breast: Bilateral breast exam performed.  Right mastectomy incision intact without nodularity.  No palpable mass or lesion in her left breast. GI: Abdomen soft and round. No tenderness to palpation. Bowel sounds normoactive in 4 quadrants. No hepatosplenomegaly.   GU: Deferred.  Musculoskeletal: Muscle strength 5/5 in all extremities.   Neuro: No focal deficits. Steady gait.  Psych: Mood and affect normal and appropriate for situation.  Extremities: No edema, cyanosis, or clubbing.  Skin: Warm and dry. No open lesions noted.   LABORATORY DATA:  No results found for this or any previous visit (from the past 2160 hour(s)).  DIAGNOSTIC IMAGING: Right diagnostic mammo performed 08/04/2015 failed to identify mass in right breast seen on screening mammo 07/20/2015.  Parenchymal pattern of right breast appears similar to  previous with no persistent mass in medial breast.  Repeat imaging recommended in six months.    ASSESSMENT AND PLAN:   1. History of breast cancer: Stage IIA (T2N1)  invasive ductal carcinoma of the left breast (02/2010), ER positive, PR positive, HER2/neu negative, S/P left mastectomy (02/2010) with pt declining chemotherapy followed by 5 years of letrozole now in a program of surveillance.  Ms. Bienaime is doing well with no clinical symptoms worrisome for cancer recurrence at this time. I have reviewed the recommendations for ongoing surveillance with her and she will follow-up with [place] in the Survivorship clinic in one year's time with history and physical exam per surveillance protocol. She was instructed to make [place] aware if she notes any change within her right breast, any new symptoms such as pain, shortness of breath, weight loss, or fatigue. She will undergo repeat diagnostic mammogram of her right breast, as above, in six months' time.    2. Blood pressure / unsteadiness: Ms. Hentges will use her daughter's BP cuff to monitor her BP from home and report if it remains  low (diastolic).  She has a follow up appointment with her PCP in July.  3. Cancer screening:  Due to Ms. Kowal's history and her age, she should receive screening for skin cancers, colon cancer, and gynecologic cancers.  The information and recommendations were shared with the patient and in her written after visit summary.   A total of 25 minutes of face-to-face time was spent with this patient with greater than 50% of that time in counseling and care-coordination.   Sylvan Cheese, NP  Survivorship Program [facility] [phone]   Note: PRIMARY CARE PROVIDER Benito Mccreedy, [place] [phone]

## 2015-08-24 ENCOUNTER — Telehealth: Payer: Self-pay | Admitting: Nurse Practitioner

## 2015-08-24 NOTE — Telephone Encounter (Signed)
appt made per 6/19 pof and calendar sent by mail

## 2015-08-30 DIAGNOSIS — F251 Schizoaffective disorder, depressive type: Secondary | ICD-10-CM | POA: Diagnosis not present

## 2015-09-15 ENCOUNTER — Other Ambulatory Visit: Payer: Self-pay | Admitting: Nurse Practitioner

## 2015-09-15 DIAGNOSIS — N63 Unspecified lump in unspecified breast: Secondary | ICD-10-CM

## 2015-09-27 DIAGNOSIS — E559 Vitamin D deficiency, unspecified: Secondary | ICD-10-CM | POA: Diagnosis not present

## 2015-09-27 DIAGNOSIS — F251 Schizoaffective disorder, depressive type: Secondary | ICD-10-CM | POA: Diagnosis not present

## 2015-09-27 DIAGNOSIS — I10 Essential (primary) hypertension: Secondary | ICD-10-CM | POA: Diagnosis not present

## 2015-10-05 DIAGNOSIS — E559 Vitamin D deficiency, unspecified: Secondary | ICD-10-CM | POA: Diagnosis not present

## 2015-10-05 DIAGNOSIS — I10 Essential (primary) hypertension: Secondary | ICD-10-CM | POA: Diagnosis not present

## 2015-10-25 DIAGNOSIS — F251 Schizoaffective disorder, depressive type: Secondary | ICD-10-CM | POA: Diagnosis not present

## 2015-11-24 DIAGNOSIS — F251 Schizoaffective disorder, depressive type: Secondary | ICD-10-CM | POA: Diagnosis not present

## 2015-11-25 DIAGNOSIS — F251 Schizoaffective disorder, depressive type: Secondary | ICD-10-CM | POA: Diagnosis not present

## 2015-12-03 DIAGNOSIS — E559 Vitamin D deficiency, unspecified: Secondary | ICD-10-CM | POA: Diagnosis not present

## 2015-12-03 DIAGNOSIS — I10 Essential (primary) hypertension: Secondary | ICD-10-CM | POA: Diagnosis not present

## 2015-12-15 DIAGNOSIS — Z23 Encounter for immunization: Secondary | ICD-10-CM | POA: Diagnosis not present

## 2015-12-31 DIAGNOSIS — E559 Vitamin D deficiency, unspecified: Secondary | ICD-10-CM | POA: Diagnosis not present

## 2015-12-31 DIAGNOSIS — I10 Essential (primary) hypertension: Secondary | ICD-10-CM | POA: Diagnosis not present

## 2016-01-03 DIAGNOSIS — E559 Vitamin D deficiency, unspecified: Secondary | ICD-10-CM | POA: Diagnosis not present

## 2016-01-03 DIAGNOSIS — R739 Hyperglycemia, unspecified: Secondary | ICD-10-CM | POA: Diagnosis not present

## 2016-01-03 DIAGNOSIS — G629 Polyneuropathy, unspecified: Secondary | ICD-10-CM | POA: Diagnosis not present

## 2016-01-03 DIAGNOSIS — I509 Heart failure, unspecified: Secondary | ICD-10-CM | POA: Diagnosis not present

## 2016-01-03 DIAGNOSIS — E785 Hyperlipidemia, unspecified: Secondary | ICD-10-CM | POA: Diagnosis not present

## 2016-01-03 DIAGNOSIS — I10 Essential (primary) hypertension: Secondary | ICD-10-CM | POA: Diagnosis not present

## 2016-01-24 DIAGNOSIS — F251 Schizoaffective disorder, depressive type: Secondary | ICD-10-CM | POA: Diagnosis not present

## 2016-01-28 DIAGNOSIS — I10 Essential (primary) hypertension: Secondary | ICD-10-CM | POA: Diagnosis not present

## 2016-01-28 DIAGNOSIS — E559 Vitamin D deficiency, unspecified: Secondary | ICD-10-CM | POA: Diagnosis not present

## 2016-02-14 ENCOUNTER — Ambulatory Visit
Admission: RE | Admit: 2016-02-14 | Discharge: 2016-02-14 | Disposition: A | Discharge status: Home / Self Care | Payer: Medicare Other | Source: Ambulatory Visit | Attending: Nurse Practitioner | Admitting: Nurse Practitioner

## 2016-02-14 DIAGNOSIS — R928 Other abnormal and inconclusive findings on diagnostic imaging of breast: Secondary | ICD-10-CM | POA: Diagnosis not present

## 2016-02-14 DIAGNOSIS — N63 Unspecified lump in unspecified breast: Secondary | ICD-10-CM

## 2016-02-21 DIAGNOSIS — F251 Schizoaffective disorder, depressive type: Secondary | ICD-10-CM | POA: Diagnosis not present

## 2016-03-02 DIAGNOSIS — I10 Essential (primary) hypertension: Secondary | ICD-10-CM | POA: Diagnosis not present

## 2016-03-02 DIAGNOSIS — E559 Vitamin D deficiency, unspecified: Secondary | ICD-10-CM | POA: Diagnosis not present

## 2016-03-20 ENCOUNTER — Other Ambulatory Visit: Payer: Self-pay | Admitting: *Deleted

## 2016-03-27 DIAGNOSIS — F251 Schizoaffective disorder, depressive type: Secondary | ICD-10-CM | POA: Diagnosis not present

## 2016-04-06 DIAGNOSIS — I10 Essential (primary) hypertension: Secondary | ICD-10-CM | POA: Diagnosis not present

## 2016-04-06 DIAGNOSIS — I509 Heart failure, unspecified: Secondary | ICD-10-CM | POA: Diagnosis not present

## 2016-04-06 DIAGNOSIS — E559 Vitamin D deficiency, unspecified: Secondary | ICD-10-CM | POA: Diagnosis not present

## 2016-04-06 DIAGNOSIS — E785 Hyperlipidemia, unspecified: Secondary | ICD-10-CM | POA: Diagnosis not present

## 2016-04-06 DIAGNOSIS — G629 Polyneuropathy, unspecified: Secondary | ICD-10-CM | POA: Diagnosis not present

## 2016-04-06 DIAGNOSIS — R739 Hyperglycemia, unspecified: Secondary | ICD-10-CM | POA: Diagnosis not present

## 2016-04-06 DIAGNOSIS — Z0000 Encounter for general adult medical examination without abnormal findings: Secondary | ICD-10-CM | POA: Diagnosis not present

## 2016-04-25 DIAGNOSIS — F251 Schizoaffective disorder, depressive type: Secondary | ICD-10-CM | POA: Diagnosis not present

## 2016-05-23 DIAGNOSIS — F251 Schizoaffective disorder, depressive type: Secondary | ICD-10-CM | POA: Diagnosis not present

## 2016-06-20 DIAGNOSIS — F251 Schizoaffective disorder, depressive type: Secondary | ICD-10-CM | POA: Diagnosis not present

## 2016-07-06 DIAGNOSIS — N183 Chronic kidney disease, stage 3 (moderate): Secondary | ICD-10-CM | POA: Diagnosis not present

## 2016-07-06 DIAGNOSIS — E559 Vitamin D deficiency, unspecified: Secondary | ICD-10-CM | POA: Diagnosis not present

## 2016-07-06 DIAGNOSIS — G629 Polyneuropathy, unspecified: Secondary | ICD-10-CM | POA: Diagnosis not present

## 2016-07-06 DIAGNOSIS — I509 Heart failure, unspecified: Secondary | ICD-10-CM | POA: Diagnosis not present

## 2016-07-06 DIAGNOSIS — I10 Essential (primary) hypertension: Secondary | ICD-10-CM | POA: Diagnosis not present

## 2016-07-06 DIAGNOSIS — E785 Hyperlipidemia, unspecified: Secondary | ICD-10-CM | POA: Diagnosis not present

## 2016-07-18 DIAGNOSIS — F251 Schizoaffective disorder, depressive type: Secondary | ICD-10-CM | POA: Diagnosis not present

## 2016-08-14 DIAGNOSIS — F251 Schizoaffective disorder, depressive type: Secondary | ICD-10-CM | POA: Diagnosis not present

## 2016-08-21 ENCOUNTER — Encounter: Payer: Medicare Other | Admitting: Nurse Practitioner

## 2016-08-22 ENCOUNTER — Other Ambulatory Visit: Payer: Self-pay | Admitting: Adult Health

## 2016-08-22 ENCOUNTER — Telehealth: Payer: Self-pay | Admitting: *Deleted

## 2016-08-22 ENCOUNTER — Ambulatory Visit (HOSPITAL_BASED_OUTPATIENT_CLINIC_OR_DEPARTMENT_OTHER): Payer: Medicare Other | Admitting: Adult Health

## 2016-08-22 VITALS — BP 121/52 | HR 62 | Temp 97.9°F | Resp 18 | Ht 59.0 in | Wt 214.3 lb

## 2016-08-22 DIAGNOSIS — Z853 Personal history of malignant neoplasm of breast: Secondary | ICD-10-CM | POA: Diagnosis not present

## 2016-08-22 DIAGNOSIS — C50112 Malignant neoplasm of central portion of left female breast: Secondary | ICD-10-CM

## 2016-08-22 DIAGNOSIS — R928 Other abnormal and inconclusive findings on diagnostic imaging of breast: Secondary | ICD-10-CM

## 2016-08-22 DIAGNOSIS — E2839 Other primary ovarian failure: Secondary | ICD-10-CM

## 2016-08-22 DIAGNOSIS — Z170 Estrogen receptor positive status [ER+]: Secondary | ICD-10-CM

## 2016-08-22 DIAGNOSIS — N6310 Unspecified lump in the right breast, unspecified quadrant: Secondary | ICD-10-CM

## 2016-08-22 NOTE — Progress Notes (Signed)
CLINIC:  Survivorship   REASON FOR VISIT:  Routine follow-up for history of breast cancer.   BRIEF ONCOLOGIC HISTORY:    Breast cancer, left breast ([place])   02/2010 Definitive Surgery    Left mastectomy/ALND: invasive ductal carcinoma, grade 2, ER/PR+, HER2/neu negative, k167 21%      02/2010 Pathologic Stage    Stage IIA: T2 N90mc       Chemotherapy    declined      03/2010 -  Anti-estrogen oral therapy    Letrozole 2.5 mg; planned duration of 5 years of therapy       Breast UKorea   Small well-defined probable minimally complicated cyst at the 2 o'clock position of the right breast measuring 3 x 6 x 7 mm, being followed closely with diagnostic mammogram and ultrasound on a every 6 month basis.        INTERVAL HISTORY:  Ms. AFresepresents to the Survivorship Clinic today for routine follow-up for her history of breast cancer.  Overall, she reports feeling quite well. She sees her PCP regularly.  No recent GYN visits.  She is due for colonoscopy.  [facility].  She is up to date with mammograms.     REVIEW OF SYSTEMS:  Review of Systems  Constitutional: Negative for appetite change, chills, diaphoresis, fatigue, fever and unexpected weight change.  HENT:   Negative for hearing loss and lump/mass.   Eyes: Negative for eye problems and icterus.  Respiratory: Negative for chest tightness, cough and shortness of breath.   Cardiovascular: Negative for chest pain, leg swelling and palpitations.  Gastrointestinal: Negative for abdominal distention, abdominal pain, constipation, diarrhea, nausea and vomiting.  Endocrine: Negative for hot flashes.  Genitourinary: Negative for difficulty urinating.   Musculoskeletal: Negative for arthralgias.  Skin: Negative for itching and rash.  Neurological: Negative for dizziness, extremity weakness, headaches and numbness.  Hematological: Negative for adenopathy. Does not  bruise/bleed easily.  Psychiatric/Behavioral: Negative for depression. The patient is not nervous/anxious.   Breast: Denies any new nodularity, masses, tenderness, nipple changes, or nipple discharge.       PAST MEDICAL/SURGICAL HISTORY:  Past Medical History:  Diagnosis Date  . Anxiety   [place]    ([place] breast ca dx 02/2010  . Breast cancer ([place] 03/31/2011  . Depression   . GERD (gastroesophageal reflux disease) 12/17/2012  . Hypertension   . Hypertension 12/17/2012  . Nausea alone 12/17/2012  . Schizophrenia ([facility]    Past Surgical History:  Procedure Laterality Date  . BREAST SURGERY    . TONSILLECTOMY       ALLERGIES:  No Known Allergies   CURRENT MEDICATIONS:  Outpatient Encounter Prescriptions as of 08/22/2016  Medication Sig Note  . ARIPiprazole (ABILIFY IM) Inject into the muscle every 30 (thirty) days.   .Marland Kitchenaspirin 325 MG tablet Take 325 mg by mouth daily.   . clobetasol cream (TEMOVATE) 08.09% Apply 1 application topically 2 (two) times daily.    . CRESTOR 10 MG tablet Take 10 mg by mouth daily.  06/25/2014: Received from: External Pharmacy  . Cyanocobalamin (VITAMIN B12 PO) Take 1 capsule by mouth.   . escitalopram (LEXAPRO) 20 MG tablet Take 20 mg by mouth daily.   . fluticasone (FLONASE) 50 MCG/ACT nasal spray as needed.  06/25/2014: Received from: External Pharmacy  . lisinopril (PRINIVIL,ZESTRIL) 30 MG tablet Take 30 mg by mouth daily.    .Marland Kitchen  Multiple Vitamin (MULTIVITAMIN WITH MINERALS) TABS tablet Take 1 tablet by mouth daily.   Marland Kitchen spironolactone (ALDACTONE) 25 MG tablet Take 25 mg by mouth daily.   . sucralfate (CARAFATE) 1 G tablet Take 1 g by mouth 4 (four) times daily.   . [DISCONTINUED] ARIPiprazole (ABILIFY) 15 MG tablet Inject 15 mg into the muscle every 30 (thirty) days.     No facility-administered encounter medications on file as of 08/22/2016.      ONCOLOGIC FAMILY HISTORY:  No family history on file.    SOCIAL HISTORY:  STUART GUILLEN is single and lives in [place], [place].  She has a daughter who is with her today.  Ms. Stacey is currently unemployed.  She denies any current or history of tobacco, alcohol, or illicit drug use.     PHYSICAL EXAMINATION:  Vital Signs: Vitals:   08/22/16 1522  BP: (!) 121/52  Pulse: 62  Resp: 18  Temp: 97.9 F (36.6 C)   Filed Weights   08/22/16 1522  Weight: 214 lb 4.8 oz (97.2 kg)   General: Well-nourished, well-appearing female in no acute distress.  Accompanied by her daughter today.   HEENT: Head is normocephalic.  Pupils equal and reactive to light. Conjunctivae clear without exudate.  Sclerae anicteric. Oral mucosa is pink, moist.  Oropharynx is pink without lesions or erythema.  Lymph: No cervical, supraclavicular, or infraclavicular lymphadenopathy noted on palpation.  Cardiovascular: Regular rate and rhythm.Marland Kitchen Respiratory: Clear to auscultation bilaterally. Chest expansion symmetric; breathing non-labored.  Breast Exam:  -Left breast: surgically absent, no nodularity, mass, skin change noted -Right breast: No appreciable masses on palpation. No skin redness, thickening, or peau d'orange appearance; no nipple retraction or nipple discharge;  -Axilla: No axillary adenopathy bilaterally.  GI: Abdomen soft and round; non-tender, non-distended. Bowel sounds normoactive. No hepatosplenomegaly.   GU: Deferred.  Neuro: No focal deficits. Steady gait.  Psych: Mood and affect normal and appropriate for situation.  MSK: No focal spinal tenderness to palpation, full range of motion in bilateral upper extremities Extremities: No edema. Skin: Warm and dry.  LABORATORY DATA:  None for this visit   DIAGNOSTIC IMAGING:  Most recent mammogram:       ASSESSMENT AND PLAN:  Ms.. Sweeten is a pleasant 71 y.o. female with history of Stage IIA left breast invasive ductal carcinoma, ER+/PR+/HER2-, diagnosed in 02/2010, treated with mastectomy, adjuvant radiation therapy,  and anti-estrogen therapy with letrozole x 5 year..  She presents to the Survivorship Clinic for surveillance and routine follow-up.   1. History of breast cancer:  Ms. Soucek is currently clinically and radiographically without evidence of disease or recurrence of breast cancer. She will be due for mammogram in 08/2016; orders placed today..  I encouraged her to call me with any questions or concerns before her next visit at the cancer center, and I would be happy to see her sooner, if needed.    2. Bone health:  Given Ms. Michele's age, history of breast cancer, and her previous anti-estrogen therapy with Letrozole, she is at risk for bone demineralization. Her last DEXA scan was on 02/19/2013 and was normal.  I am going to re order another one today.   She was given education on specific food and activities to promote bone health.  3. Cancer screening:  Due to Ms. Baranski's history and her age, she should receive screening for skin cancers, colon cancer. She was encouraged to follow-up with her PCP for appropriate cancer screenings.   4.  Health maintenance and wellness promotion: Ms. Holloman was encouraged to consume 5-7 servings of fruits and vegetables per day. She was also encouraged to engage in moderate to vigorous exercise for 30 minutes per day most days of the week. She was instructed to limit her alcohol consumption and continue to abstain from tobacco use.    Dispo:  -Return to cancer center in one year for LTS visit -Mammogram and bone density, next available appointment   A total of (30) minutes of face-to-face time was spent with this patient with greater than 50% of that time in counseling and care-coordination.   Gardenia Phlegm, NP Survivorship Program [facility] [phone]   Note: PRIMARY CARE PROVIDER Benito Mccreedy, [place] [phone]

## 2016-08-22 NOTE — Telephone Encounter (Signed)
Called and left message for daughter to let her know pt's mammo and bone density is scheduled for June 29 at [facility]. If pt or daughter has any questions or concerns, can call this nurse back at [phone]. Message to be fwd to [employer].

## 2016-08-23 ENCOUNTER — Encounter: Payer: Self-pay | Admitting: Adult Health

## 2016-08-25 ENCOUNTER — Other Ambulatory Visit: Payer: Medicare Other

## 2016-09-01 ENCOUNTER — Ambulatory Visit
Admission: RE | Admit: 2016-09-01 | Discharge: 2016-09-01 | Disposition: A | Discharge status: Home / Self Care | Payer: Medicare Other | Source: Ambulatory Visit | Attending: Adult Health | Admitting: Adult Health

## 2016-09-01 DIAGNOSIS — Z780 Asymptomatic menopausal state: Secondary | ICD-10-CM | POA: Diagnosis not present

## 2016-09-01 DIAGNOSIS — R928 Other abnormal and inconclusive findings on diagnostic imaging of breast: Secondary | ICD-10-CM | POA: Diagnosis not present

## 2016-09-01 DIAGNOSIS — E2839 Other primary ovarian failure: Secondary | ICD-10-CM

## 2016-09-01 DIAGNOSIS — N6489 Other specified disorders of breast: Secondary | ICD-10-CM | POA: Diagnosis not present

## 2016-09-01 DIAGNOSIS — N6310 Unspecified lump in the right breast, unspecified quadrant: Secondary | ICD-10-CM

## 2016-09-01 DIAGNOSIS — M85832 Other specified disorders of bone density and structure, left forearm: Secondary | ICD-10-CM | POA: Diagnosis not present

## 2016-09-11 DIAGNOSIS — F251 Schizoaffective disorder, depressive type: Secondary | ICD-10-CM | POA: Diagnosis not present

## 2016-09-18 DIAGNOSIS — G629 Polyneuropathy, unspecified: Secondary | ICD-10-CM | POA: Diagnosis not present

## 2016-09-18 DIAGNOSIS — I10 Essential (primary) hypertension: Secondary | ICD-10-CM | POA: Diagnosis not present

## 2016-09-18 DIAGNOSIS — I509 Heart failure, unspecified: Secondary | ICD-10-CM | POA: Diagnosis not present

## 2016-09-18 DIAGNOSIS — E785 Hyperlipidemia, unspecified: Secondary | ICD-10-CM | POA: Diagnosis not present

## 2016-09-18 DIAGNOSIS — E559 Vitamin D deficiency, unspecified: Secondary | ICD-10-CM | POA: Diagnosis not present

## 2016-09-18 DIAGNOSIS — N183 Chronic kidney disease, stage 3 (moderate): Secondary | ICD-10-CM | POA: Diagnosis not present

## 2016-09-28 DIAGNOSIS — I10 Essential (primary) hypertension: Secondary | ICD-10-CM | POA: Diagnosis not present

## 2016-09-28 DIAGNOSIS — I509 Heart failure, unspecified: Secondary | ICD-10-CM | POA: Diagnosis not present

## 2016-09-28 DIAGNOSIS — E785 Hyperlipidemia, unspecified: Secondary | ICD-10-CM | POA: Diagnosis not present

## 2016-09-28 DIAGNOSIS — N183 Chronic kidney disease, stage 3 (moderate): Secondary | ICD-10-CM | POA: Diagnosis not present

## 2016-09-28 DIAGNOSIS — G629 Polyneuropathy, unspecified: Secondary | ICD-10-CM | POA: Diagnosis not present

## 2016-09-28 DIAGNOSIS — E559 Vitamin D deficiency, unspecified: Secondary | ICD-10-CM | POA: Diagnosis not present

## 2016-10-09 DIAGNOSIS — F251 Schizoaffective disorder, depressive type: Secondary | ICD-10-CM | POA: Diagnosis not present

## 2016-11-07 DIAGNOSIS — F251 Schizoaffective disorder, depressive type: Secondary | ICD-10-CM | POA: Diagnosis not present

## 2016-11-21 DIAGNOSIS — F251 Schizoaffective disorder, depressive type: Secondary | ICD-10-CM | POA: Diagnosis not present

## 2016-12-04 DIAGNOSIS — F251 Schizoaffective disorder, depressive type: Secondary | ICD-10-CM | POA: Diagnosis not present

## 2016-12-07 DIAGNOSIS — Z23 Encounter for immunization: Secondary | ICD-10-CM | POA: Diagnosis not present

## 2016-12-11 DIAGNOSIS — I10 Essential (primary) hypertension: Secondary | ICD-10-CM | POA: Diagnosis not present

## 2016-12-11 DIAGNOSIS — E559 Vitamin D deficiency, unspecified: Secondary | ICD-10-CM | POA: Diagnosis not present

## 2016-12-11 DIAGNOSIS — E785 Hyperlipidemia, unspecified: Secondary | ICD-10-CM | POA: Diagnosis not present

## 2016-12-11 DIAGNOSIS — N183 Chronic kidney disease, stage 3 (moderate): Secondary | ICD-10-CM | POA: Diagnosis not present

## 2016-12-11 DIAGNOSIS — I509 Heart failure, unspecified: Secondary | ICD-10-CM | POA: Diagnosis not present

## 2016-12-11 DIAGNOSIS — M79671 Pain in right foot: Secondary | ICD-10-CM | POA: Diagnosis not present

## 2016-12-11 DIAGNOSIS — G629 Polyneuropathy, unspecified: Secondary | ICD-10-CM | POA: Diagnosis not present

## 2016-12-25 DIAGNOSIS — E559 Vitamin D deficiency, unspecified: Secondary | ICD-10-CM | POA: Diagnosis not present

## 2016-12-25 DIAGNOSIS — R0602 Shortness of breath: Secondary | ICD-10-CM | POA: Diagnosis not present

## 2016-12-25 DIAGNOSIS — I509 Heart failure, unspecified: Secondary | ICD-10-CM | POA: Diagnosis not present

## 2016-12-25 DIAGNOSIS — E785 Hyperlipidemia, unspecified: Secondary | ICD-10-CM | POA: Diagnosis not present

## 2016-12-25 DIAGNOSIS — N183 Chronic kidney disease, stage 3 (moderate): Secondary | ICD-10-CM | POA: Diagnosis not present

## 2016-12-25 DIAGNOSIS — R609 Edema, unspecified: Secondary | ICD-10-CM | POA: Diagnosis not present

## 2016-12-25 DIAGNOSIS — G629 Polyneuropathy, unspecified: Secondary | ICD-10-CM | POA: Diagnosis not present

## 2016-12-25 DIAGNOSIS — I10 Essential (primary) hypertension: Secondary | ICD-10-CM | POA: Diagnosis not present

## 2017-01-08 DIAGNOSIS — F251 Schizoaffective disorder, depressive type: Secondary | ICD-10-CM | POA: Diagnosis not present

## 2017-01-24 DIAGNOSIS — I119 Hypertensive heart disease without heart failure: Secondary | ICD-10-CM | POA: Diagnosis not present

## 2017-01-24 DIAGNOSIS — I10 Essential (primary) hypertension: Secondary | ICD-10-CM | POA: Diagnosis not present

## 2017-02-05 DIAGNOSIS — F251 Schizoaffective disorder, depressive type: Secondary | ICD-10-CM | POA: Diagnosis not present

## 2017-03-03 DIAGNOSIS — H6692 Otitis media, unspecified, left ear: Secondary | ICD-10-CM | POA: Diagnosis not present

## 2017-03-05 DIAGNOSIS — F251 Schizoaffective disorder, depressive type: Secondary | ICD-10-CM | POA: Diagnosis not present

## 2017-03-14 DIAGNOSIS — I119 Hypertensive heart disease without heart failure: Secondary | ICD-10-CM | POA: Diagnosis not present

## 2017-03-14 DIAGNOSIS — I10 Essential (primary) hypertension: Secondary | ICD-10-CM | POA: Diagnosis not present

## 2017-04-02 DIAGNOSIS — F251 Schizoaffective disorder, depressive type: Secondary | ICD-10-CM | POA: Diagnosis not present

## 2017-04-09 DIAGNOSIS — E559 Vitamin D deficiency, unspecified: Secondary | ICD-10-CM | POA: Diagnosis not present

## 2017-04-09 DIAGNOSIS — N183 Chronic kidney disease, stage 3 (moderate): Secondary | ICD-10-CM | POA: Diagnosis not present

## 2017-04-09 DIAGNOSIS — R609 Edema, unspecified: Secondary | ICD-10-CM | POA: Diagnosis not present

## 2017-04-09 DIAGNOSIS — I10 Essential (primary) hypertension: Secondary | ICD-10-CM | POA: Diagnosis not present

## 2017-04-09 DIAGNOSIS — G629 Polyneuropathy, unspecified: Secondary | ICD-10-CM | POA: Diagnosis not present

## 2017-04-09 DIAGNOSIS — I509 Heart failure, unspecified: Secondary | ICD-10-CM | POA: Diagnosis not present

## 2017-04-09 DIAGNOSIS — E785 Hyperlipidemia, unspecified: Secondary | ICD-10-CM | POA: Diagnosis not present

## 2017-04-09 DIAGNOSIS — Z0000 Encounter for general adult medical examination without abnormal findings: Secondary | ICD-10-CM | POA: Diagnosis not present

## 2017-04-25 DIAGNOSIS — E785 Hyperlipidemia, unspecified: Secondary | ICD-10-CM | POA: Diagnosis not present

## 2017-04-25 DIAGNOSIS — I10 Essential (primary) hypertension: Secondary | ICD-10-CM | POA: Diagnosis not present

## 2017-04-30 DIAGNOSIS — F251 Schizoaffective disorder, depressive type: Secondary | ICD-10-CM | POA: Diagnosis not present

## 2017-05-23 DIAGNOSIS — I10 Essential (primary) hypertension: Secondary | ICD-10-CM | POA: Diagnosis not present

## 2017-05-23 DIAGNOSIS — E785 Hyperlipidemia, unspecified: Secondary | ICD-10-CM | POA: Diagnosis not present

## 2017-05-29 DIAGNOSIS — F251 Schizoaffective disorder, depressive type: Secondary | ICD-10-CM | POA: Diagnosis not present

## 2017-06-26 DIAGNOSIS — R609 Edema, unspecified: Secondary | ICD-10-CM | POA: Diagnosis not present

## 2017-06-26 DIAGNOSIS — E785 Hyperlipidemia, unspecified: Secondary | ICD-10-CM | POA: Diagnosis not present

## 2017-06-26 DIAGNOSIS — N183 Chronic kidney disease, stage 3 (moderate): Secondary | ICD-10-CM | POA: Diagnosis not present

## 2017-06-26 DIAGNOSIS — I509 Heart failure, unspecified: Secondary | ICD-10-CM | POA: Diagnosis not present

## 2017-06-26 DIAGNOSIS — I10 Essential (primary) hypertension: Secondary | ICD-10-CM | POA: Diagnosis not present

## 2017-06-26 DIAGNOSIS — E559 Vitamin D deficiency, unspecified: Secondary | ICD-10-CM | POA: Diagnosis not present

## 2017-06-26 DIAGNOSIS — G629 Polyneuropathy, unspecified: Secondary | ICD-10-CM | POA: Diagnosis not present

## 2017-06-27 DIAGNOSIS — E785 Hyperlipidemia, unspecified: Secondary | ICD-10-CM | POA: Diagnosis not present

## 2017-06-27 DIAGNOSIS — I509 Heart failure, unspecified: Secondary | ICD-10-CM | POA: Diagnosis not present

## 2017-07-25 DIAGNOSIS — I119 Hypertensive heart disease without heart failure: Secondary | ICD-10-CM | POA: Diagnosis not present

## 2017-07-25 DIAGNOSIS — E785 Hyperlipidemia, unspecified: Secondary | ICD-10-CM | POA: Diagnosis not present

## 2017-08-24 ENCOUNTER — Inpatient Hospital Stay: Payer: Medicare Other | Admitting: Adult Health

## 2017-08-24 ENCOUNTER — Telehealth: Payer: Self-pay | Admitting: Adult Health

## 2017-08-24 NOTE — Telephone Encounter (Signed)
Patient called to reschedule  °

## 2017-08-28 DIAGNOSIS — I10 Essential (primary) hypertension: Secondary | ICD-10-CM | POA: Diagnosis not present

## 2017-08-28 DIAGNOSIS — I5032 Chronic diastolic (congestive) heart failure: Secondary | ICD-10-CM | POA: Diagnosis not present

## 2017-08-28 DIAGNOSIS — N183 Chronic kidney disease, stage 3 (moderate): Secondary | ICD-10-CM | POA: Diagnosis not present

## 2017-08-28 DIAGNOSIS — E785 Hyperlipidemia, unspecified: Secondary | ICD-10-CM | POA: Diagnosis not present

## 2017-08-28 DIAGNOSIS — G629 Polyneuropathy, unspecified: Secondary | ICD-10-CM | POA: Diagnosis not present

## 2017-08-28 DIAGNOSIS — E559 Vitamin D deficiency, unspecified: Secondary | ICD-10-CM | POA: Diagnosis not present

## 2017-09-03 ENCOUNTER — Inpatient Hospital Stay: Payer: Medicare Other | Admitting: Adult Health

## 2017-09-03 ENCOUNTER — Telehealth: Payer: Self-pay

## 2017-09-03 NOTE — Telephone Encounter (Signed)
lvm for pt regarding her missed appt today with LC

## 2017-09-10 ENCOUNTER — Telehealth: Payer: Self-pay | Admitting: Adult Health

## 2017-09-10 ENCOUNTER — Inpatient Hospital Stay: Payer: Medicare Other | Attending: Adult Health | Admitting: Adult Health

## 2017-09-10 VITALS — BP 141/53 | HR 64 | Temp 98.2°F | Resp 18 | Ht 59.0 in | Wt 229.1 lb

## 2017-09-10 DIAGNOSIS — Z853 Personal history of malignant neoplasm of breast: Secondary | ICD-10-CM | POA: Insufficient documentation

## 2017-09-10 DIAGNOSIS — Z170 Estrogen receptor positive status [ER+]: Secondary | ICD-10-CM

## 2017-09-10 DIAGNOSIS — Z1239 Encounter for other screening for malignant neoplasm of breast: Secondary | ICD-10-CM

## 2017-09-10 DIAGNOSIS — M8580 Other specified disorders of bone density and structure, unspecified site: Secondary | ICD-10-CM | POA: Insufficient documentation

## 2017-09-10 DIAGNOSIS — C50112 Malignant neoplasm of central portion of left female breast: Secondary | ICD-10-CM

## 2017-09-10 NOTE — Patient Instructions (Signed)
Bone Health Bones protect organs, store calcium, and anchor muscles. Good health habits, such as eating nutritious foods and exercising regularly, are important for maintaining healthy bones. They can also help to prevent a condition that causes bones to lose density and become weak and brittle (osteoporosis). Why is bone mass important? Bone mass refers to the amount of bone tissue that you have. The higher your bone mass, the stronger your bones. An important step toward having healthy bones throughout life is to have strong and dense bones during childhood. A young adult who has a high bone mass is more likely to have a high bone mass later in life. Bone mass at its greatest it is called peak bone mass. A large decline in bone mass occurs in older adults. In women, it occurs about the time of menopause. During this time, it is important to practice good health habits, because if more bone is lost than what is replaced, the bones will become less healthy and more likely to break (fracture). If you find that you have a low bone mass, you may be able to prevent osteoporosis or further bone loss by changing your diet and lifestyle. How can I find out if my bone mass is low? Bone mass can be measured with an X-ray test that is called a bone mineral density (BMD) test. This test is recommended for all women who are age 65 or older. It may also be recommended for men who are age 70 or older, or for people who are more likely to develop osteoporosis due to:  Having bones that break easily.  Having a long-term disease that weakens bones, such as kidney disease or rheumatoid arthritis.  Having menopause earlier than normal.  Taking medicine that weakens bones, such as steroids, thyroid hormones, or hormone treatment for breast cancer or prostate cancer.  Smoking.  Drinking three or more alcoholic drinks each day.  What are the nutritional recommendations for healthy bones? To have healthy bones, you  need to get enough of the right minerals and vitamins. Most nutrition experts recommend getting these nutrients from the foods that you eat. Nutritional recommendations vary from person to person. Ask your health care provider what is healthy for you. Here are some general guidelines. Calcium Recommendations Calcium is the most important (essential) mineral for bone health. Most people can get enough calcium from their diet, but supplements may be recommended for people who are at risk for osteoporosis. Good sources of calcium include:  Dairy products, such as low-fat or nonfat milk, cheese, and yogurt.  Dark green leafy vegetables, such as bok choy and broccoli.  Calcium-fortified foods, such as orange juice, cereal, bread, soy beverages, and tofu products.  Nuts, such as almonds.  Follow these recommended amounts for daily calcium intake:  Children, age 1?3: 700 mg.  Children, age 4?8: 1,000 mg.  Children, age 9?13: 1,300 mg.  Teens, age 14?18: 1,300 mg.  Adults, age 19?50: 1,000 mg.  Adults, age 51?70: ? Men: 1,000 mg. ? Women: 1,200 mg.  Adults, age 71 or older: 1,200 mg.  Pregnant and breastfeeding females: ? Teens: 1,300 mg. ? Adults: 1,000 mg.  Vitamin D Recommendations Vitamin D is the most essential vitamin for bone health. It helps the body to absorb calcium. Sunlight stimulates the skin to make vitamin D, so be sure to get enough sunlight. If you live in a cold climate or you do not get outside often, your health care provider may recommend that you take vitamin   D supplements. Good sources of vitamin D in your diet include:  Egg yolks.  Saltwater fish.  Milk and cereal fortified with vitamin D.  Follow these recommended amounts for daily vitamin D intake:  Children and teens, age 1?18: 600 international units.  Adults, age 50 or younger: 400-800 international units.  Adults, age 51 or older: 800-1,000 international units.  Other Nutrients Other nutrients  for bone health include:  Phosphorus. This mineral is found in meat, poultry, dairy foods, nuts, and legumes. The recommended daily intake for adult men and adult women is 700 mg.  Magnesium. This mineral is found in seeds, nuts, dark green vegetables, and legumes. The recommended daily intake for adult men is 400?420 mg. For adult women, it is 310?320 mg.  Vitamin K. This vitamin is found in green leafy vegetables. The recommended daily intake is 120 mg for adult men and 90 mg for adult women.  What type of physical activity is best for building and maintaining healthy bones? Weight-bearing and strength-building activities are important for building and maintaining peak bone mass. Weight-bearing activities cause muscles and bones to work against gravity. Strength-building activities increases muscle strength that supports bones. Weight-bearing and muscle-building activities include:  Walking and hiking.  Jogging and running.  Dancing.  Gym exercises.  Lifting weights.  Tennis and racquetball.  Climbing stairs.  Aerobics.  Adults should get at least 30 minutes of moderate physical activity on most days. Children should get at least 60 minutes of moderate physical activity on most days. Ask your health care provide what type of exercise is best for you. Where can I find more information? For more information, check out the following websites:  National Osteoporosis Foundation: http://nof.org/learn/basics  National Institutes of Health: http://www.niams.nih.gov/Health_Info/Bone/Bone_Health/bone_health_for_life.asp  This information is not intended to replace advice given to you by your health care provider. Make sure you discuss any questions you have with your health care provider. Document Released: 05/13/2003 Document Revised: 09/10/2015 Document Reviewed: 02/25/2014 Elsevier Interactive Patient Education  2018 Elsevier Inc.  

## 2017-09-10 NOTE — Telephone Encounter (Signed)
Gave pt avs and calendar with appts per 7/8 los.

## 2017-09-10 NOTE — Progress Notes (Signed)
CLINIC:  Survivorship   REASON FOR VISIT:  Routine follow-up for history of breast cancer.   BRIEF ONCOLOGIC HISTORY:    Breast cancer, left breast ([place])   02/2010 Definitive Surgery    Left mastectomy/ALND: invasive ductal carcinoma, grade 2, ER/PR+, HER2/neu negative, k167 21%      02/2010 Pathologic Stage    Stage IIA: T2 N27mc       Chemotherapy    declined      03/2010 - 03/2015 Anti-estrogen oral therapy    Letrozole 2.5 mg; planned duration of 5 years of therapy        INTERVAL HISTORY:  [facility] for her [facility] of breast cancer.  Overall, she reports feeling quite well.   Elizabeth [name] here with her daughter Elizabeth [name]  She is doing well.  She recently saw her PCP regarding leg swelling.  She sees her PCP regularly and is up to date with her cancer screenings.    Elizabeth [name] exercises with a group at her apartment complex [place] [name]  She also rides her stationary bike daily for about 15 minutes per occasion.      REVIEW OF SYSTEMS:  Review of Systems  Constitutional: Negative for appetite change, chills, fatigue, fever and unexpected weight change.  HENT:   Negative for hearing loss, lump/mass, sore throat and trouble swallowing.   Eyes: Negative for eye problems and icterus.  Respiratory: Negative for chest tightness, cough and shortness of breath.   Cardiovascular: Negative for chest pain and leg swelling.  Gastrointestinal: Negative for abdominal distention, abdominal pain, constipation, diarrhea, nausea and vomiting.  Endocrine: Negative for hot flashes.  Musculoskeletal: Negative for arthralgias.  Skin: Negative for itching and rash.  Neurological: Negative for dizziness, extremity weakness, headaches and numbness.  Hematological: Negative for adenopathy. Does not bruise/bleed easily.  Psychiatric/Behavioral: Negative for depression. The patient is not nervous/anxious.   Breast: Denies any new  nodularity, masses, tenderness, nipple changes, or nipple discharge.       PAST MEDICAL/SURGICAL HISTORY:  Past Medical History:  Diagnosis Date  . Anxiety   . [place]    ([place]) breast ca dx 02/2010  . [place]  . Depression   . GERD (gastroesophageal reflux disease) 12/17/2012  . Hypertension   . Hypertension 12/17/2012  . Nausea alone 12/17/2012  . Schizophrenia ([facility]    Past Surgical History:  Procedure Laterality Date  . BREAST SURGERY    . MASTECTOMY Left   . TONSILLECTOMY       ALLERGIES:  No Known Allergies   CURRENT MEDICATIONS:  Outpatient Encounter Medications as of 09/10/2017  Medication Sig Note  . ARIPiprazole (ABILIFY) 15 MG tablet    . aspirin 325 MG tablet Take 325 mg by mouth daily.   . CRESTOR 10 MG tablet Take 10 mg by mouth daily.  06/25/2014: Received from: External Pharmacy  . Cyanocobalamin (VITAMIN B12 PO) Take 1 capsule by mouth.   . escitalopram (LEXAPRO) 20 MG tablet Take 20 mg by mouth daily.   .Marland Kitchenlisinopril (PRINIVIL,ZESTRIL) 30 MG tablet Take 30 mg by mouth daily.    . Multiple Vitamin (MULTIVITAMIN WITH MINERALS) TABS tablet Take 1 tablet by mouth daily.   .Marland Kitchenspironolactone (ALDACTONE) 25 MG tablet Take 25 mg by mouth daily.   . sucralfate (CARAFATE) 1 G tablet Take 1 g by mouth 4 (four) times daily.   . VENTOLIN HFA 108 (90 Base) MCG/ACT inhaler    . [  DISCONTINUED] ARIPiprazole (ABILIFY IM) Inject into the muscle every 30 (thirty) days.   . [DISCONTINUED] clobetasol cream (TEMOVATE) 6.01 % Apply 1 application topically 2 (two) times daily.    . [DISCONTINUED] fluticasone (FLONASE) 50 MCG/ACT nasal spray as needed.  06/25/2014: Received from: External Pharmacy   No facility-administered encounter medications on file as of 09/10/2017.      ONCOLOGIC FAMILY HISTORY:  Non contributory  GENETIC COUNSELING/TESTING: Not at this time  SOCIAL HISTORY:  Social History   Socioeconomic History  . Marital status:  Divorced    Spouse name: Not on file  . Number of children: Not on file  . Years of education: Not on file  . Highest education level: Not on file  Occupational History  . Not on file  Social Needs  . Financial resource strain: Not on file  . Food insecurity:    Worry: Not on file    Inability: Not on file  . Transportation needs:    Medical: Not on file    Non-medical: Not on file  Tobacco Use  . Smoking status: Never Smoker  . Smokeless tobacco: Never Used  Substance and Sexual Activity  . Alcohol use: No  . Drug use: No  . Sexual activity: Not Currently  Lifestyle  . Physical activity:    Days per week: Not on file    Minutes per session: Not on file  . Stress: Not on file  Relationships  . Social connections:    Talks on phone: Not on file    Gets together: Not on file    Attends religious service: Not on file    Active member of club or organization: Not on file    Attends meetings of clubs or organizations: Not on file    Relationship status: Not on file  . Intimate partner violence:    Fear of current or ex partner: Not on file    Emotionally abused: Not on file    Physically abused: Not on file    Forced sexual activity: Not on file  Other Topics Concern  . Not on file  Social History Narrative  . Not on file      PHYSICAL EXAMINATION:  Vital Signs: Vitals:   09/10/17 1521  BP: (!) 141/53  Pulse: 64  Resp: 18  Temp: 98.2 F (36.8 C)  SpO2: 100%   Filed Weights   09/10/17 1521  Weight: 229 lb 1.6 oz (103.9 kg)   General: Well-nourished, well-appearing female in no acute distress.  Unaccompanied today.   HEENT: Head is normocephalic.  Pupils equal and reactive to light. Conjunctivae clear without exudate.  Sclerae anicteric. Oral mucosa is pink, moist.  Oropharynx is pink without lesions or erythema.  Lymph: No cervical, supraclavicular, or infraclavicular lymphadenopathy noted on palpation.  Cardiovascular: Regular rate and  rhythm.Marland Kitchen Respiratory: Clear to auscultation bilaterally. Chest expansion symmetric; breathing non-labored.  Breast Exam:  -Left breast:s/p mastectomy, no nodules, masses, or any other issues  -Right breast: No appreciable masses on palpation. No skin redness, thickening, or peau d'orange appearance; no nipple retraction or nipple discharge;-Axilla: No axillary adenopathy bilaterally.  GI: Abdomen soft and round; non-tender, non-distended. Bowel sounds normoactive. No hepatosplenomegaly.   GU: Deferred.  Neuro: No focal deficits. Steady gait.  Psych: Mood and affect normal and appropriate for situation.  MSK: No focal spinal tenderness to palpation, full range of motion in bilateral upper extremities Extremities: No edema. Skin: Warm and dry.  LABORATORY DATA:  None for this visit  DIAGNOSTIC IMAGING:  Most recent mammogram and ultrasound: 09/01/2016    Most recent bone density:    ASSESSMENT AND PLAN:  Ms.. [name] is a pleasant 72 y.o. female with history of Stage IIA left breast invasive ductal carcinoma, ER+/PR+/HER2-, diagnosed in 02/2010, treated with mastectomy and anti estrogen therapy with Letrozole x 5 years, completing therapy in 03/2015.  She presents to the Survivorship Clinic for surveillance and routine follow-up.   1. History of breast cancer:  Elizabeth [name] is currently clinically and radiographically without evidence of disease or recurrence of breast cancer. She will be due for mammogram in 09/2017; orders placed today.  She will return in one year for LTS follow up.  I encouraged her to call me with any questions or concerns before her next visit at the cancer center, and I would be happy to see her sooner, if needed.    2. Bone health:  Given Elizabeth [name] age, history of breast cancer, and her previous anti-estrogen therapy with Letrozole, she is at risk for bone demineralization.  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  She was given education on specific food and activities to promote bone health.  3. Cancer screening:  Due to Elizabeth [name] history and her age, she should receive screening for skin cancers, colon cancer. She was encouraged to follow-up with her PCP for appropriate cancer screenings.   4. Health maintenance and wellness promotion: Elizabeth [name] was encouraged to consume 5-7 servings of fruits and vegetables per day. She was also encouraged to engage in moderate to vigorous exercise for 30 minutes per day most days of the week. She was instructed to limit her alcohol consumption and continue to abstain from tobacco use.      Dispo:  -Return to cancer center in one year for LTS follow up -Mammogram due   A total of (30) minutes of face-to-face time was spent with this patient with greater than 50% of that time in counseling and care-coordination.   Gardenia Phlegm, NP Survivorship Program [facility] [phone]   Note: PRIMARY CARE PROVIDER Benito Mccreedy, [place] [phone]

## 2017-09-11 ENCOUNTER — Encounter: Payer: Self-pay | Admitting: Adult Health

## 2017-10-16 ENCOUNTER — Ambulatory Visit
Admission: RE | Admit: 2017-10-16 | Discharge: 2017-10-16 | Disposition: A | Discharge status: Home / Self Care | Payer: Medicare Other | Source: Ambulatory Visit | Attending: Adult Health | Admitting: Adult Health

## 2017-10-16 DIAGNOSIS — Z1231 Encounter for screening mammogram for malignant neoplasm of breast: Secondary | ICD-10-CM | POA: Diagnosis not present

## 2017-10-16 DIAGNOSIS — Z1239 Encounter for other screening for malignant neoplasm of breast: Secondary | ICD-10-CM

## 2017-11-26 DIAGNOSIS — F251 Schizoaffective disorder, depressive type: Secondary | ICD-10-CM | POA: Diagnosis not present

## 2017-12-06 DIAGNOSIS — Z23 Encounter for immunization: Secondary | ICD-10-CM | POA: Diagnosis not present

## 2018-02-23 DIAGNOSIS — H1013 Acute atopic conjunctivitis, bilateral: Secondary | ICD-10-CM | POA: Diagnosis not present

## 2018-02-23 DIAGNOSIS — H40033 Anatomical narrow angle, bilateral: Secondary | ICD-10-CM | POA: Diagnosis not present

## 2018-04-09 DIAGNOSIS — Z0000 Encounter for general adult medical examination without abnormal findings: Secondary | ICD-10-CM | POA: Diagnosis not present

## 2018-04-09 DIAGNOSIS — E782 Mixed hyperlipidemia: Secondary | ICD-10-CM | POA: Diagnosis not present

## 2018-04-09 DIAGNOSIS — N183 Chronic kidney disease, stage 3 (moderate): Secondary | ICD-10-CM | POA: Diagnosis not present

## 2018-04-09 DIAGNOSIS — E559 Vitamin D deficiency, unspecified: Secondary | ICD-10-CM | POA: Diagnosis not present

## 2018-04-09 DIAGNOSIS — I5032 Chronic diastolic (congestive) heart failure: Secondary | ICD-10-CM | POA: Diagnosis not present

## 2018-04-09 DIAGNOSIS — I10 Essential (primary) hypertension: Secondary | ICD-10-CM | POA: Diagnosis not present

## 2018-04-09 DIAGNOSIS — G629 Polyneuropathy, unspecified: Secondary | ICD-10-CM | POA: Diagnosis not present

## 2018-04-09 DIAGNOSIS — Z1329 Encounter for screening for other suspected endocrine disorder: Secondary | ICD-10-CM | POA: Diagnosis not present

## 2018-04-23 DIAGNOSIS — N183 Chronic kidney disease, stage 3 (moderate): Secondary | ICD-10-CM | POA: Diagnosis not present

## 2018-04-23 DIAGNOSIS — G629 Polyneuropathy, unspecified: Secondary | ICD-10-CM | POA: Diagnosis not present

## 2018-04-23 DIAGNOSIS — M545 Low back pain: Secondary | ICD-10-CM | POA: Diagnosis not present

## 2018-04-23 DIAGNOSIS — I5032 Chronic diastolic (congestive) heart failure: Secondary | ICD-10-CM | POA: Diagnosis not present

## 2018-04-23 DIAGNOSIS — R7303 Prediabetes: Secondary | ICD-10-CM | POA: Diagnosis not present

## 2018-04-23 DIAGNOSIS — E782 Mixed hyperlipidemia: Secondary | ICD-10-CM | POA: Diagnosis not present

## 2018-04-23 DIAGNOSIS — I10 Essential (primary) hypertension: Secondary | ICD-10-CM | POA: Diagnosis not present

## 2018-04-23 DIAGNOSIS — E559 Vitamin D deficiency, unspecified: Secondary | ICD-10-CM | POA: Diagnosis not present

## 2018-05-27 DIAGNOSIS — F251 Schizoaffective disorder, depressive type: Secondary | ICD-10-CM | POA: Diagnosis not present

## 2018-08-27 ENCOUNTER — Telehealth: Payer: Self-pay | Admitting: Adult Health

## 2018-08-27 NOTE — Telephone Encounter (Signed)
I Talk with patients daughter and she said that smart phone would be best she is having trouble with mychart for her mom

## 2018-09-04 DIAGNOSIS — I10 Essential (primary) hypertension: Secondary | ICD-10-CM | POA: Diagnosis not present

## 2018-09-04 DIAGNOSIS — R7303 Prediabetes: Secondary | ICD-10-CM | POA: Diagnosis not present

## 2018-09-04 DIAGNOSIS — Z131 Encounter for screening for diabetes mellitus: Secondary | ICD-10-CM | POA: Diagnosis not present

## 2018-09-04 DIAGNOSIS — N939 Abnormal uterine and vaginal bleeding, unspecified: Secondary | ICD-10-CM | POA: Diagnosis not present

## 2018-09-04 DIAGNOSIS — E559 Vitamin D deficiency, unspecified: Secondary | ICD-10-CM | POA: Diagnosis not present

## 2018-09-04 DIAGNOSIS — I5032 Chronic diastolic (congestive) heart failure: Secondary | ICD-10-CM | POA: Diagnosis not present

## 2018-09-04 DIAGNOSIS — E782 Mixed hyperlipidemia: Secondary | ICD-10-CM | POA: Diagnosis not present

## 2018-09-04 DIAGNOSIS — G629 Polyneuropathy, unspecified: Secondary | ICD-10-CM | POA: Diagnosis not present

## 2018-09-04 DIAGNOSIS — N183 Chronic kidney disease, stage 3 (moderate): Secondary | ICD-10-CM | POA: Diagnosis not present

## 2018-09-05 ENCOUNTER — Other Ambulatory Visit: Payer: Self-pay | Admitting: Physician Assistant

## 2018-09-05 DIAGNOSIS — N939 Abnormal uterine and vaginal bleeding, unspecified: Secondary | ICD-10-CM

## 2018-09-13 ENCOUNTER — Ambulatory Visit
Admission: RE | Admit: 2018-09-13 | Discharge: 2018-09-13 | Disposition: A | Discharge status: Home / Self Care | Payer: Medicare Other | Source: Ambulatory Visit | Attending: Physician Assistant | Admitting: Physician Assistant

## 2018-09-13 DIAGNOSIS — D251 Intramural leiomyoma of uterus: Secondary | ICD-10-CM | POA: Diagnosis not present

## 2018-09-13 DIAGNOSIS — N939 Abnormal uterine and vaginal bleeding, unspecified: Secondary | ICD-10-CM

## 2018-09-16 ENCOUNTER — Inpatient Hospital Stay: Payer: Medicare Other | Attending: Adult Health | Admitting: Adult Health

## 2018-09-16 ENCOUNTER — Encounter: Payer: Self-pay | Admitting: Adult Health

## 2018-09-16 DIAGNOSIS — Z79811 Long term (current) use of aromatase inhibitors: Secondary | ICD-10-CM

## 2018-09-16 DIAGNOSIS — C50112 Malignant neoplasm of central portion of left female breast: Secondary | ICD-10-CM

## 2018-09-16 DIAGNOSIS — Z79899 Other long term (current) drug therapy: Secondary | ICD-10-CM

## 2018-09-16 DIAGNOSIS — Z7982 Long term (current) use of aspirin: Secondary | ICD-10-CM | POA: Diagnosis not present

## 2018-09-16 DIAGNOSIS — Z170 Estrogen receptor positive status [ER+]: Secondary | ICD-10-CM | POA: Diagnosis not present

## 2018-09-16 NOTE — Progress Notes (Signed)
SURVIVORSHIP VIRTUAL VISIT:  I connected with Elizabeth [name] on 09/16/18 at  3:00 PM EDT by Doximity video and verified that I am speaking with the correct person using two identifiers.   I discussed the limitations, risks, security and privacy concerns of performing an evaluation and management service virtually and the availability of in person appointments. I also discussed with the patient that there may be a patient responsible charge related to this service. The patient expressed understanding and agreed to proceed.     REASON FOR VISIT:  Routine follow-up for history of breast cancer.   BRIEF ONCOLOGIC HISTORY:  Oncology History  Breast cancer, left breast ([place])  02/2010 Definitive Surgery   Left mastectomy/ALND: invasive ductal carcinoma, grade 2, ER/PR+, HER2/neu negative, k167 21%   02/2010 Pathologic Stage   Stage IIA: T2 N40mc    Chemotherapy   declined   03/2010 - 03/2015 Anti-estrogen oral therapy   Letrozole 2.5 mg; planned duration of 5 years of therapy      INTERVAL HISTORY:  [facility] of breast cancer.  Overall, she reports feeling quite well. Elizabeth [name] doing well.  She has had no changes in her health.  She notes she went to see gynecology for an ultrasound due to vaginal bleeding.  This was done on 09/13/2018 and she has not yet f/u with gyn about the results.    Since her last visit she underwent right breast mammogram that showed no evidence of malignancy and breast density category B.  Elizabeth [name] been riding her stationary bike.  Elizabeth [name] by herself and is independent.  She notes her daughter Elizabeth Bamehelps her when needed.    Elizabeth [name] sees her PCP regularly.  She is unsure if she is up to date with colon cancer screening.  She says she cannot recall if anyone checks her skin regularly.    REVIEW OF SYSTEMS:  Review of Systems  Constitutional: Negative for appetite change,  chills, fatigue and unexpected weight change.  HENT:   Negative for hearing loss, lump/mass, sore throat and trouble swallowing.   Eyes: Negative for eye problems and icterus.  Respiratory: Positive for cough (chronic, related to allergies) and wheezing (when coughing, her inhaler helps this). Negative for chest tightness and shortness of breath.   Cardiovascular: Negative for chest pain, leg swelling and palpitations.  Gastrointestinal: Negative for abdominal distention, abdominal pain, constipation, diarrhea, nausea and vomiting.  Endocrine: Negative for hot flashes.  Genitourinary: Positive for vaginal bleeding (as per interval history).   Musculoskeletal: Negative for arthralgias.  Skin: Negative for itching.  Neurological: Negative for dizziness, extremity weakness and headaches.  Hematological: Negative for adenopathy. Does not bruise/bleed easily.  Psychiatric/Behavioral: Negative for depression. The patient is not nervous/anxious.    Breast: Denies any new nodularity, masses, tenderness, nipple changes, or nipple discharge.       PAST MEDICAL/SURGICAL HISTORY:  Past Medical History:  Diagnosis Date  . Anxiety   . Breast CA ([facility]    (Lt) breast ca dx 02/2010  . [place] [place]  . Depression   . GERD (gastroesophageal reflux disease) 12/17/2012  . Hypertension   . Hypertension 12/17/2012  . Nausea alone 12/17/2012  . Schizophrenia ([facility]    Past Surgical History:  Procedure Laterality Date  . BREAST SURGERY    . MASTECTOMY Left   . TONSILLECTOMY       ALLERGIES:  No Known Allergies   CURRENT MEDICATIONS:  Outpatient Encounter Medications as of 09/16/2018  Medication Sig Note  . ARIPiprazole (ABILIFY) 15 MG tablet    . aspirin 325 MG tablet Take 325 mg by mouth daily.   . CRESTOR 10 MG tablet Take 10 mg by mouth daily.  06/25/2014: Received from: External Pharmacy  . Cyanocobalamin (VITAMIN B12 PO) Take 1 capsule by mouth.   . escitalopram  (LEXAPRO) 20 MG tablet Take 20 mg by mouth daily.   Marland Kitchen lisinopril (PRINIVIL,ZESTRIL) 30 MG tablet Take 30 mg by mouth daily.    . Multiple Vitamin (MULTIVITAMIN WITH MINERALS) TABS tablet Take 1 tablet by mouth daily.   Marland Kitchen spironolactone (ALDACTONE) 25 MG tablet Take 25 mg by mouth daily.   . sucralfate (CARAFATE) 1 G tablet Take 1 g by mouth 4 (four) times daily.   Elizabeth [name] HFA 108 (90 Base) MCG/ACT inhaler     No facility-administered encounter medications on file as of 09/16/2018.      ONCOLOGIC FAMILY HISTORY:  Non contributory  GENETIC COUNSELING/TESTING: Not at this time  SOCIAL HISTORY:  Social History   Socioeconomic History  . Marital status: Divorced    Spouse name: Not on file  . Number of children: Not on file  . Years of education: Not on file  . Highest education level: Not on file  Occupational History  . Not on file  Social Needs  . Financial resource strain: Not on file  . Food insecurity    Worry: Not on file    Inability: Not on file  . Transportation needs    Medical: Not on file    Non-medical: Not on file  Tobacco Use  . Smoking status: Never Smoker  . Smokeless tobacco: Never Used  Substance and Sexual Activity  . Alcohol use: No  . Drug use: No  . Sexual activity: Not Currently  Lifestyle  . Physical activity    Days per week: Not on file    Minutes per session: Not on file  . Stress: Not on file  Relationships  . Social Herbalist on phone: Not on file    Gets together: Not on file    Attends religious service: Not on file    Active member of club or organization: Not on file    Attends meetings of clubs or organizations: Not on file    Relationship status: Not on file  . Intimate partner violence    Fear of current or ex partner: Not on file    Emotionally abused: Not on file    Physically abused: Not on file    Forced sexual activity: Not on file  Other Topics Concern  . Not on file  Social History Narrative  . Not on  file      OBJECTIVE:  Patient appears well.  She is in no apparent distress.  Mood and behavior are normal.  Breathing is non labored. Skin visualized without rash or lesion.  Neuro nonfocal.  LABORATORY DATA:  None for this visit   DIAGNOSTIC IMAGING:  Most recent mammogram:      ASSESSMENT AND PLAN:  Ms.. Dimon is a pleasant 73 y.o. female with history of Stage IIA left breast invasive ductal carcinoma, ER+/PR+/HER2-, diagnosed in 02/2010, treated with mastectomy, declined chemotherapy, and anti-estrogen therapy with Letrozole x 5 years completing therapy in 03/2015.  [facility] and routine follow-up.   1. History of breast cancer:  Ms. Sanzo is currently clinically and radiographically  without evidence of disease or recurrence of breast cancer. She will be due for mammogram in 10/2018; orders placed today.    I encouraged her to call me with any questions or concerns before her next visit at the cancer center, and I would be happy to see her sooner, if needed.    2. Bone health:  Given Ms. Niebla's age, history of breast cancer, and her previous anti-estrogen therapy with Letrozole, she is at risk for bone demineralization. Her last DEXA scan was on 09/01/2016 and showed a T score of -2.3 in her left forearm.  Her FRAX risk is 3.5% in the next ten years.  In the meantime, she was encouraged to increase her consumption of foods rich in calcium, as well as increase her weight-bearing activities.  She was given education on specific food and activities to promote bone health.  3. Cancer screening:  Due to Ms. Jon's history and her age, she should receive screening for skin cancers, colon cancer, and gynecologic cancers. She was encouraged to follow-up with her PCP for appropriate cancer screenings.   4. Health maintenance and wellness promotion: Ms. Bevilacqua was encouraged to consume 5-7 servings of fruits and vegetables per day. She was also  encouraged to engage in moderate to vigorous exercise for 30 minutes per day most days of the week. She was instructed to limit her alcohol consumption and continue to abstain from tobacco use.      Follow up instructions:    -Return to cancer center in one year for LTS follow up  -Mammogram due in 10/2018 -Bone Density due in 10/2018    The patient was provided an opportunity to ask questions and all were answered. The patient agreed with the plan and demonstrated an understanding of the instructions.   The patient was advised to call back or seek an in-person evaluation if the symptoms worsen or if the condition fails to improve as anticipated.   I provided 15 minutes of face-to-face video visit time during this encounter, and > 50% was spent counseling as documented under my assessment & plan.   Gardenia Phlegm, NP Survivorship Program [facility] [phone]   Note: PRIMARY CARE PROVIDER Benito Mccreedy, [place] [phone]

## 2018-10-02 DIAGNOSIS — N183 Chronic kidney disease, stage 3 (moderate): Secondary | ICD-10-CM | POA: Diagnosis not present

## 2018-10-02 DIAGNOSIS — R7303 Prediabetes: Secondary | ICD-10-CM | POA: Diagnosis not present

## 2018-10-02 DIAGNOSIS — I5032 Chronic diastolic (congestive) heart failure: Secondary | ICD-10-CM | POA: Diagnosis not present

## 2018-10-02 DIAGNOSIS — E559 Vitamin D deficiency, unspecified: Secondary | ICD-10-CM | POA: Diagnosis not present

## 2018-10-02 DIAGNOSIS — G629 Polyneuropathy, unspecified: Secondary | ICD-10-CM | POA: Diagnosis not present

## 2018-10-02 DIAGNOSIS — N939 Abnormal uterine and vaginal bleeding, unspecified: Secondary | ICD-10-CM | POA: Diagnosis not present

## 2018-10-02 DIAGNOSIS — E782 Mixed hyperlipidemia: Secondary | ICD-10-CM | POA: Diagnosis not present

## 2018-10-02 DIAGNOSIS — I10 Essential (primary) hypertension: Secondary | ICD-10-CM | POA: Diagnosis not present

## 2018-10-25 ENCOUNTER — Other Ambulatory Visit: Payer: Self-pay | Admitting: Adult Health

## 2018-10-25 DIAGNOSIS — Z1231 Encounter for screening mammogram for malignant neoplasm of breast: Secondary | ICD-10-CM

## 2018-11-06 DIAGNOSIS — C541 Malignant neoplasm of endometrium: Secondary | ICD-10-CM | POA: Diagnosis not present

## 2018-11-06 DIAGNOSIS — Z6838 Body mass index (BMI) 38.0-38.9, adult: Secondary | ICD-10-CM | POA: Diagnosis not present

## 2018-11-06 DIAGNOSIS — N950 Postmenopausal bleeding: Secondary | ICD-10-CM | POA: Diagnosis not present

## 2018-11-06 DIAGNOSIS — Z124 Encounter for screening for malignant neoplasm of cervix: Secondary | ICD-10-CM | POA: Diagnosis not present

## 2018-11-07 DIAGNOSIS — Z124 Encounter for screening for malignant neoplasm of cervix: Secondary | ICD-10-CM | POA: Diagnosis not present

## 2018-11-15 ENCOUNTER — Telehealth: Payer: Self-pay | Admitting: *Deleted

## 2018-11-15 NOTE — Telephone Encounter (Signed)
Called and spoke with the patient regarding her referral from Dr. Ulanda Edison. Patient requested that I call her daughter. Called and spoke with the daughter. Scheduled an appt for Monday 9/14 at 10:41m am

## 2018-11-18 ENCOUNTER — Inpatient Hospital Stay: Payer: Medicare Other | Attending: Adult Health | Admitting: Gynecologic Oncology

## 2018-11-18 ENCOUNTER — Other Ambulatory Visit: Payer: Self-pay

## 2018-11-18 ENCOUNTER — Telehealth: Payer: Self-pay

## 2018-11-18 ENCOUNTER — Other Ambulatory Visit: Payer: Self-pay | Admitting: Gynecologic Oncology

## 2018-11-18 ENCOUNTER — Encounter: Payer: Self-pay | Admitting: Gynecologic Oncology

## 2018-11-18 VITALS — BP 127/55 | HR 55 | Temp 98.3°F | Resp 18 | Ht 60.0 in | Wt 227.0 lb

## 2018-11-18 DIAGNOSIS — C541 Malignant neoplasm of endometrium: Secondary | ICD-10-CM

## 2018-11-18 DIAGNOSIS — I509 Heart failure, unspecified: Secondary | ICD-10-CM | POA: Diagnosis not present

## 2018-11-18 DIAGNOSIS — E7800 Pure hypercholesterolemia, unspecified: Secondary | ICD-10-CM | POA: Diagnosis not present

## 2018-11-18 DIAGNOSIS — C55 Malignant neoplasm of uterus, part unspecified: Secondary | ICD-10-CM

## 2018-11-18 DIAGNOSIS — Z853 Personal history of malignant neoplasm of breast: Secondary | ICD-10-CM | POA: Diagnosis not present

## 2018-11-18 DIAGNOSIS — I110 Hypertensive heart disease with heart failure: Secondary | ICD-10-CM | POA: Insufficient documentation

## 2018-11-18 DIAGNOSIS — Z9013 Acquired absence of bilateral breasts and nipples: Secondary | ICD-10-CM | POA: Diagnosis not present

## 2018-11-18 DIAGNOSIS — F419 Anxiety disorder, unspecified: Secondary | ICD-10-CM | POA: Diagnosis not present

## 2018-11-18 DIAGNOSIS — F209 Schizophrenia, unspecified: Secondary | ICD-10-CM | POA: Insufficient documentation

## 2018-11-18 DIAGNOSIS — Z6841 Body Mass Index (BMI) 40.0 and over, adult: Secondary | ICD-10-CM | POA: Insufficient documentation

## 2018-11-18 DIAGNOSIS — Z7982 Long term (current) use of aspirin: Secondary | ICD-10-CM | POA: Insufficient documentation

## 2018-11-18 DIAGNOSIS — Z79811 Long term (current) use of aromatase inhibitors: Secondary | ICD-10-CM | POA: Insufficient documentation

## 2018-11-18 DIAGNOSIS — F329 Major depressive disorder, single episode, unspecified: Secondary | ICD-10-CM | POA: Diagnosis not present

## 2018-11-18 DIAGNOSIS — Z79818 Long term (current) use of other agents affecting estrogen receptors and estrogen levels: Secondary | ICD-10-CM | POA: Insufficient documentation

## 2018-11-18 DIAGNOSIS — Z79899 Other long term (current) drug therapy: Secondary | ICD-10-CM | POA: Insufficient documentation

## 2018-11-18 DIAGNOSIS — K219 Gastro-esophageal reflux disease without esophagitis: Secondary | ICD-10-CM | POA: Insufficient documentation

## 2018-11-18 MED ORDER — MEGESTROL ACETATE 40 MG PO TABS: 40.0000 mg | ORAL_TABLET | Freq: Every day | ORAL | 1 refills | Status: DC

## 2018-11-18 MED ORDER — TRAMADOL HCL 50 MG PO TABS: 50.0000 mg | ORAL_TABLET | Freq: Four times a day (QID) | ORAL | 0 refills | Status: DC | PRN

## 2018-11-18 MED ORDER — SENNOSIDES-DOCUSATE SODIUM 8.6-50 MG PO TABS: 2.0000 | ORAL_TABLET | Freq: Every day | ORAL | 0 refills | Status: DC

## 2018-11-18 NOTE — Telephone Encounter (Signed)
Told daughter Junie Bame that her mother is scheduled for cardiology visit with Dr. Irven Shelling Nurse Practitioner  Binnie Kand on Wednesday 9-30 -20 at 1100.  Arrive ~1030 to register. Gave daughter address and phone number to the office. Daughter verbalized understanding.

## 2018-11-18 NOTE — Progress Notes (Signed)
Consult Note: Gyn-Onc  Consult was requested by Dr. Ulanda Edison for the evaluation of Elizabeth [name] 73 y.o. female  CC:  Chief Complaint  Patient presents with  . Endometrioid adenocarcinoma of uterus [facility])    Assessment/Plan:  Ms. ELLIANNE GOWEN  is a 73 y.o.  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  A detailed discussion was held with the patient and her family with regard to to her endometrial cancer diagnosis. We discussed the standard management options for uterine cancer which includes surgery followed possibly by adjuvant therapy depending on the results of surgery. The options for surgical management include a hysterectomy and removal of the tubes and ovaries possibly with removal of pelvic and para-aortic lymph nodes.If feasible, a minimally invasive approach including a robotic hysterectomy or laparoscopic hysterectomy have benefits including shorter hospital stay, recovery time and better wound healing than with open surgery. The patient has been counseled about these surgical options and the risks of surgery in general including infection, bleeding, damage to surrounding structures (including bowel, bladder, ureters, nerves or vessels), and the postoperative risks of PE/ DVT, and lymphedema. I extensively reviewed the additional risks of robotic hysterectomy including possible need for conversion to open laparotomy.  I discussed positioning during surgery of trendelenberg and risks of minor facial swelling and care we take in preoperative positioning.  I explained that due to her morbid obesity she was at increased risk for these perioperative complications.  After counseling and consideration of her options, she desires to proceed with robotic assisted total hysterectomy with bilateral sapingo-oophorectomy and SLN biopsy.   She is at an increased risk for perioperative cardiovascular events given her  past vague history of CHF and no recent echocardiogram.  We will obtain a preoperative echo and if abnormal we will have her seen by her cardiologist for optimization before proceeding with surgery.  In the meantime I prescribed her oral progesterone therapy which serves as a medical therapy for endometrial cancer.  She will be seen by anesthesia for preoperative clearance and discussion of postoperative pain management.  She was given the opportunity to ask questions, which were answered to her satisfaction, and she is agreement with the above mentioned plan of care.  HPI: [name] is a 73 year old parous woman who was seen in consultation at the request of Dr. Lannette Donath for FIGO grade 1 endometrioid adenocarcinoma in the setting of morbid obesity, schizophrenia, and history of congestive heart failure.  The patient reports abnormal postmenopausal bleeding since July 2020.  This prompted her to be seen by Dr. Ulanda Edison who performed a Pap which was negative for cytologic abnormalities.  He also performed an endometrial biopsy on November 06, 2018 which revealed FIGO grade 1 endometrioid adenocarcinoma arising in the background of complex atypical hyperplasia.  Her history is significant for morbid obesity with a BMI of 44 kg/m.  She has schizophrenia but lives alone.  Her daughter lives in town.  She has a history of left stage IIa breast cancer with positive axillary node (microscopic) ER PR positive HER-2 negative.  This was diagnosed and treated in 2011.  She was recommended to receive adjuvant chemotherapy but declined.  Instead she received mastectomy, and letrozole therapy postoperatively.  She is remained NED since that time.  Her medical history is also significant for history of congestive heart heart failure approximately 10 years ago.  She reports that Dr. Ezequiel Ganser is her cardiologist but has not  seen him for many years and has not had a echocardiogram for many years.  She also has a  history of hypertension.  She reports some shortness of breath on exertion but no chest pain.  She has a history of hypercholesterolemia.  She denies antiplatelet or anticoagulant use.  A transvaginal ultrasound had been performed on September 13, 2018 and revealed a uterus measuring 11.6 x 4.1 x 5.7 cm.  The endometrial thickness measured 18 mm.  There is a myometrial mass measuring a centimeter in the fundus likely a fibroid.  The right and left ovaries were grossly normal.  There was no free fluid.  Current Meds:  Outpatient Encounter Medications as of 11/18/2018  Medication Sig  . ARIPiprazole (ABILIFY) 15 MG tablet   . aspirin 325 MG tablet Take 325 mg by mouth daily.  Marland Kitchen BENZTROPINE MESYLATE PO Take by mouth.  . CRESTOR 10 MG tablet Take 10 mg by mouth daily.   . Cyanocobalamin (VITAMIN B12 PO) Take 1 capsule by mouth.  . escitalopram (LEXAPRO) 20 MG tablet Take 20 mg by mouth daily.  . furosemide (LASIX) 20 MG tablet Take 20 mg by mouth daily.  Marland Kitchen lisinopril (PRINIVIL,ZESTRIL) 30 MG tablet Take 30 mg by mouth daily.   Marland Kitchen spironolactone (ALDACTONE) 25 MG tablet Take 25 mg by mouth daily.  . VENTOLIN HFA 108 (90 Base) MCG/ACT inhaler   . megestrol (MEGACE) 40 MG tablet Take 1 tablet (40 mg total) by mouth daily.  . Multiple Vitamin (MULTIVITAMIN WITH MINERALS) TABS tablet Take 1 tablet by mouth daily.  Marland Kitchen senna-docusate (SENOKOT-S) 8.6-50 MG tablet Take 2 tablets by mouth at bedtime. For AFTER surgery, do not take if having diarrhea  . sucralfate (CARAFATE) 1 G tablet Take 1 g by mouth 4 (four) times daily.  . traMADol (ULTRAM) 50 MG tablet Take 1 tablet (50 mg total) by mouth every 6 (six) hours as needed for severe pain. For AFTER surgery, only do not take and drive   No facility-administered encounter medications on file as of 11/18/2018.     Allergy: No Known Allergies  Social Hx:   Social History   Socioeconomic History  . Marital status: Divorced    Spouse name: Not on file  .  Number of children: Not on file  . Years of education: Not on file  . Highest education level: Not on file  Occupational History  . Not on file  Social Needs  . Financial resource strain: Not on file  . Food insecurity    Worry: Not on file    Inability: Not on file  . Transportation needs    Medical: Not on file    Non-medical: Not on file  Tobacco Use  . Smoking status: Never Smoker  . Smokeless tobacco: Never Used  Substance and Sexual Activity  . Alcohol use: No  . Drug use: No  . Sexual activity: Not Currently  Lifestyle  . Physical activity    Days per week: Not on file    Minutes per session: Not on file  . Stress: Not on file  Relationships  . Social Herbalist on phone: Not on file    Gets together: Not on file    Attends religious service: Not on file    Active member of club or organization: Not on file    Attends meetings of clubs or organizations: Not on file    Relationship status: Not on file  . Intimate partner violence  Fear of current or ex partner: Not on file    Emotionally abused: Not on file    Physically abused: Not on file    Forced sexual activity: Not on file  Other Topics Concern  . Not on file  Social History Narrative  . Not on file    Past Surgical Hx:  Past Surgical History:  Procedure Laterality Date  . BREAST SURGERY    . MASTECTOMY Left   . TONSILLECTOMY      Past Medical Hx:  Past Medical History:  Diagnosis Date  . Anxiety   . Breast CA ([place])    (Lt) breast ca dx 02/2010  . Breast cancer ([place]) 03/31/2011  . Depression   . GERD (gastroesophageal reflux disease) 12/17/2012  . Hypertension   . Hypertension 12/17/2012  . Nausea alone 12/17/2012  . Schizophrenia [facility])     Past Gynecological History:   No LMP recorded. Patient is postmenopausal.  Family Hx: History reviewed. No pertinent family history.  Review of Systems:  Constitutional  Feels well,   ENT Normal appearing ears and nares  bilaterally Skin/Breast  No rash, sores, jaundice, itching, dryness Cardiovascular  No chest pain, shortness of breath, or edema  Pulmonary  No cough or wheeze.  Gastro Intestinal  No nausea, vomitting, or diarrhoea. No bright red blood per rectum, no abdominal pain, change in bowel movement, or constipation.  Genito Urinary  No frequency, urgency, dysuria, +postmenopausal bleeding Musculo Skeletal  No myalgia, arthralgia, joint swelling or pain  Neurologic  No weakness, numbness, change in gait,  Psychology  No depression, anxiety, insomnia. + schizophrenia  Vitals:  Blood pressure (!) 127/55, pulse (!) 55, temperature 98.3 F (36.8 C), temperature source Temporal, resp. rate 18, height 5' (1.524 m), weight 227 lb (103 kg), SpO2 100 %.  Physical Exam: WD in NAD Neck  Supple NROM, without any enlargements.  Lymph Node Survey No cervical supraclavicular or inguinal adenopathy Cardiovascular  Pulse normal rate, regularity and rhythm. S1 and S2 normal.  Lungs  Clear to auscultation bilateraly, without wheezes/crackles/rhonchi. Good air movement.  Skin  No rash/lesions/breakdown  Psychiatry  Alert and oriented to person, place, and time  Abdomen  Normoactive bowel sounds, abdomen soft, non-tender and obese without evidence of hernia.  Back No CVA tenderness Genito Urinary  Vulva/vagina: Normal external female genitalia.  No lesions. No discharge or bleeding.  Bladder/urethra:  No lesions or masses, well supported bladder  Vagina: normal  Cervix: Normal appearing, no lesions.  Uterus: Bulky, mobile, no parametrial involvement or nodularity.  Adnexa: no palpable masses. Rectal  deferred Extremities  No bilateral cyanosis, clubbing or edema.   Thereasa Solo, MD  11/18/2018, 6:13 PM

## 2018-11-18 NOTE — H&P (View-Only) (Signed)
Consult Note: Gyn-Onc  Consult was requested by Dr. Ulanda [name] for the evaluation of Elizabeth [name] 73 y.o. female  CC:  Chief Complaint  Patient presents with  . Endometrioid adenocarcinoma of uterus [facility])    Assessment/Plan:  Elizabeth [name]  is a 73 y.o.  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  A detailed discussion was held with the patient and her family with regard to to her endometrial cancer diagnosis. We discussed the standard management options for uterine cancer which includes surgery followed possibly by adjuvant therapy depending on the results of surgery. The options for surgical management include a hysterectomy and removal of the tubes and ovaries possibly with removal of pelvic and para-aortic lymph nodes.If feasible, a minimally invasive approach including a robotic hysterectomy or laparoscopic hysterectomy have benefits including shorter hospital stay, recovery time and better wound healing than with open surgery. The patient has been counseled about these surgical options and the risks of surgery in general including infection, bleeding, damage to surrounding structures (including bowel, bladder, ureters, nerves or vessels), and the postoperative risks of PE/ DVT, and lymphedema. I extensively reviewed the additional risks of robotic hysterectomy including possible need for conversion to open laparotomy.  I discussed positioning during surgery of trendelenberg and risks of minor facial swelling and care we take in preoperative positioning.  I explained that due to her morbid obesity she was at increased risk for these perioperative complications.  After counseling and consideration of her options, she desires to proceed with robotic assisted total hysterectomy with bilateral sapingo-oophorectomy and SLN biopsy.   She is at an increased risk for perioperative cardiovascular events given her  past vague history of CHF and no recent echocardiogram.  We will obtain a preoperative echo and if abnormal we will have her seen by her cardiologist for optimization before proceeding with surgery.  In the meantime I prescribed her oral progesterone therapy which serves as a medical therapy for endometrial cancer.  She will be seen by anesthesia for preoperative clearance and discussion of postoperative pain management.  She was given the opportunity to ask questions, which were answered to her satisfaction, and she is agreement with the above mentioned plan of care.  HPI: Elizabeth [name] is a 73 year old parous woman who was seen in consultation at the request of Dr. Lannette [name] for FIGO grade 1 endometrioid adenocarcinoma in the setting of morbid obesity, schizophrenia, and history of congestive heart failure.  The patient reports abnormal postmenopausal bleeding since July 2020.  This prompted her to be seen by Dr. Ulanda [name] who performed a Pap which was negative for cytologic abnormalities.  He also performed an endometrial biopsy on November 06, 2018 which revealed FIGO grade 1 endometrioid adenocarcinoma arising in the background of complex atypical hyperplasia.  Her history is significant for morbid obesity with a BMI of 44 kg/m.  She has schizophrenia but lives alone.  Her daughter lives in town.  She has a history of left stage IIa breast cancer with positive axillary node (microscopic) ER PR positive HER-2 negative.  This was diagnosed and treated in 2011.  She was recommended to receive adjuvant chemotherapy but declined.  Instead she received mastectomy, and letrozole therapy postoperatively.  She is remained NED since that time.  Her medical history is also significant for history of congestive heart heart failure approximately 10 years ago.  She reports that Dr. Ezequiel [name] is her cardiologist but has not  seen him for many years and has not had a echocardiogram for many years.  She also has a  history of hypertension.  She reports some shortness of breath on exertion but no chest pain.  She has a history of hypercholesterolemia.  She denies antiplatelet or anticoagulant use.  A transvaginal ultrasound had been performed on September 13, 2018 and revealed a uterus measuring 11.6 x 4.1 x 5.7 cm.  The endometrial thickness measured 18 mm.  There is a myometrial mass measuring a centimeter in the fundus likely a fibroid.  The right and left ovaries were grossly normal.  There was no free fluid.  Current Meds:  Outpatient Encounter Medications as of 11/18/2018  Medication Sig  . ARIPiprazole (ABILIFY) 15 MG tablet   . aspirin 325 MG tablet Take 325 mg by mouth daily.  Marland Kitchen BENZTROPINE MESYLATE PO Take by mouth.  . CRESTOR 10 MG tablet Take 10 mg by mouth daily.   . Cyanocobalamin (VITAMIN B12 PO) Take 1 capsule by mouth.  . escitalopram (LEXAPRO) 20 MG tablet Take 20 mg by mouth daily.  . furosemide (LASIX) 20 MG tablet Take 20 mg by mouth daily.  Marland Kitchen lisinopril (PRINIVIL,ZESTRIL) 30 MG tablet Take 30 mg by mouth daily.   Marland Kitchen spironolactone (ALDACTONE) 25 MG tablet Take 25 mg by mouth daily.  . VENTOLIN HFA 108 (90 Base) MCG/ACT inhaler   . megestrol (MEGACE) 40 MG tablet Take 1 tablet (40 mg total) by mouth daily.  . Multiple Vitamin (MULTIVITAMIN WITH MINERALS) TABS tablet Take 1 tablet by mouth daily.  Marland Kitchen senna-docusate (SENOKOT-S) 8.6-50 MG tablet Take 2 tablets by mouth at bedtime. For AFTER surgery, do not take if having diarrhea  . sucralfate (CARAFATE) 1 G tablet Take 1 g by mouth 4 (four) times daily.  . traMADol (ULTRAM) 50 MG tablet Take 1 tablet (50 mg total) by mouth every 6 (six) hours as needed for severe pain. For AFTER surgery, only do not take and drive   No facility-administered encounter medications on file as of 11/18/2018.     Allergy: No Known Allergies  Social Hx:   Social History   Socioeconomic History  . Marital status: Divorced    Spouse name: Not on file  .  Number of children: Not on file  . Years of education: Not on file  . Highest education level: Not on file  Occupational History  . Not on file  Social Needs  . Financial resource strain: Not on file  . Food insecurity    Worry: Not on file    Inability: Not on file  . Transportation needs    Medical: Not on file    Non-medical: Not on file  Tobacco Use  . Smoking status: Never Smoker  . Smokeless tobacco: Never Used  Substance and Sexual Activity  . Alcohol use: No  . Drug use: No  . Sexual activity: Not Currently  Lifestyle  . Physical activity    Days per week: Not on file    Minutes per session: Not on file  . Stress: Not on file  Relationships  . Social Herbalist on phone: Not on file    Gets together: Not on file    Attends religious service: Not on file    Active member of club or organization: Not on file    Attends meetings of clubs or organizations: Not on file    Relationship status: Not on file  . Intimate partner violence  Fear of current or ex partner: Not on file    Emotionally abused: Not on file    Physically abused: Not on file    Forced sexual activity: Not on file  Other Topics Concern  . Not on file  Social History Narrative  . Not on file    Past Surgical Hx:  Past Surgical History:  Procedure Laterality Date  . BREAST SURGERY    . MASTECTOMY Left   . TONSILLECTOMY      Past Medical Hx:  Past Medical History:  Diagnosis Date  . Anxiety   . Breast CA ([place])    (Lt) breast ca dx 02/2010  . Breast cancer ([place]) 03/31/2011  . Depression   . GERD (gastroesophageal reflux disease) 12/17/2012  . Hypertension   . Hypertension 12/17/2012  . Nausea alone 12/17/2012  . Schizophrenia [facility])     Past Gynecological History:   No LMP recorded. Patient is postmenopausal.  Family Hx: History reviewed. No pertinent family history.  Review of Systems:  Constitutional  Feels well,   ENT Normal appearing ears and nares  bilaterally Skin/Breast  No rash, sores, jaundice, itching, dryness Cardiovascular  No chest pain, shortness of breath, or edema  Pulmonary  No cough or wheeze.  Gastro Intestinal  No nausea, vomitting, or diarrhoea. No bright red blood per rectum, no abdominal pain, change in bowel movement, or constipation.  Genito Urinary  No frequency, urgency, dysuria, +postmenopausal bleeding Musculo Skeletal  No myalgia, arthralgia, joint swelling or pain  Neurologic  No weakness, numbness, change in gait,  Psychology  No depression, anxiety, insomnia. + schizophrenia  Vitals:  Blood pressure (!) 127/55, pulse (!) 55, temperature 98.3 F (36.8 C), temperature source Temporal, resp. rate 18, height 5' (1.524 m), weight 227 lb (103 kg), SpO2 100 %.  Physical Exam: WD in NAD Neck  Supple NROM, without any enlargements.  Lymph Node Survey No cervical supraclavicular or inguinal adenopathy Cardiovascular  Pulse normal rate, regularity and rhythm. S1 and S2 normal.  Lungs  Clear to auscultation bilateraly, without wheezes/crackles/rhonchi. Good air movement.  Skin  No rash/lesions/breakdown  Psychiatry  Alert and oriented to person, place, and time  Abdomen  Normoactive bowel sounds, abdomen soft, non-tender and obese without evidence of hernia.  Back No CVA tenderness Genito Urinary  Vulva/vagina: Normal external female genitalia.  No lesions. No discharge or bleeding.  Bladder/urethra:  No lesions or masses, well supported bladder  Vagina: normal  Cervix: Normal appearing, no lesions.  Uterus: Bulky, mobile, no parametrial involvement or nodularity.  Adnexa: no palpable masses. Rectal  deferred Extremities  No bilateral cyanosis, clubbing or edema.   Thereasa Solo, MD  11/18/2018, 6:13 PM

## 2018-11-18 NOTE — Patient Instructions (Signed)
Preparing for your Surgery  Plan for surgery on December 17, 2018 with Dr. Everitt [name] at [place] will be scheduled for a robotic assisted total laparoscopic hysterectomy, bilateral salpingo-oophorectomy, sentinel lymph node biopsy.   Pre-operative Testing -You will receive a phone call from presurgical testing at [facility] if you have not received a call already to arrange for a pre-operative testing appointment before your surgery.  This appointment normally occurs one to two weeks before your scheduled surgery.   -Bring your insurance card, copy of an advanced directive if applicable, medication list  -At that visit, you will be asked to sign a consent for a possible blood transfusion in case a transfusion becomes necessary during surgery.  The need for a blood transfusion is rare but having consent is a necessary part of your care.     -STOP TAKING ASPIRIN.  -Do not take supplements such as fish oil (omega 3), red yeast rice, tumeric before your surgery.  Day Before Surgery at [place] will be asked to take in a light diet the day before surgery.  Avoid carbonated beverages.  You will be advised to have nothing to eat or drink after midnight the evening before.    Eat a light diet the day before surgery.  Examples including soups, broths, toast, yogurt, mashed potatoes.  Things to avoid include carbonated beverages (fizzy beverages), raw fruits and raw vegetables, or beans.   If your bowels are filled with gas, your surgeon will have difficulty visualizing your pelvic organs which increases your surgical risks.  Your role in recovery Your role is to become active as soon as directed by your doctor, while still giving yourself time to heal.  Rest when you feel tired. You will be asked to do the following in order to speed your recovery:  - Cough and breathe deeply. This helps toclear and expand your lungs and can prevent pneumonia. You may be given a spirometer  to practice deep breathing. A staff member will show you how to use the spirometer. - Do mild physical activity. Walking or moving your legs help your circulation and body functions return to normal. A staff member will help you when you try to walk and will provide you with simple exercises. Do not try to get up or walk alone the first time. - Actively manage your pain. Managing your pain lets you move in comfort. We will ask you to rate your pain on a scale of zero to 10. It is your responsibility to tell your doctor or nurse where and how much you hurt so your pain can be treated.  Special Considerations -If you are diabetic, you may be placed on insulin after surgery to have closer control over your blood sugars to promote healing and recovery.  This does not mean that you will be discharged on insulin.  If applicable, your oral antidiabetics will be resumed when you are tolerating a solid diet.  -Your final pathology results from surgery should be available around one week after surgery and the results will be relayed to you when available.  -Dr. Lahoma [name] is the surgeon that assists your GYN Oncologist with surgery.  If you end up staying the night, the next day after your surgery you will either see Dr. Denman [name] or Dr. Lahoma [name].  -FMLA forms can be faxed to [phone] and please allow 5-7 business days for completion.  Pain Management After Surgery -You have been prescribed your pain medication and  bowel regimen medications before surgery so that you can have these available when you are discharged from the hospital. The pain medication is for use ONLY AFTER surgery and a new prescription will not be given.   -Make sure that you have Tylenol and Ibuprofen at home to use on a regular basis after surgery for pain control. We recommend alternating the medications every hour to six hours since they work differently and are processed in the body differently for pain relief.   -Review the attached handout on narcotic use and their risks and side effects.   Bowel Regimen -You have been prescribed Sennakot-S to take nightly to prevent constipation especially if you are taking the narcotic pain medication intermittently.  It is important to prevent constipation and drink adequate amounts of liquids.  Blood Transfusion Information WHAT IS A BLOOD TRANSFUSION? A transfusion is the replacement of blood or some of its parts. Blood is made up of multiple cells which provide different functions.  Red blood cells carry oxygen and are used for blood loss replacement.  White blood cells fight against infection.  Platelets control bleeding.  Plasma helps clot blood.  Other blood products are available for specialized needs, such as hemophilia or other clotting disorders. BEFORE THE TRANSFUSION  Who gives blood for transfusions?   You may be able to donate blood to be used at a later date on yourself (autologous donation).  Relatives can be asked to donate blood. This is generally not any safer than if you have received blood from a stranger. The same precautions are taken to ensure safety when a relative's blood is donated.  Healthy volunteers who are fully evaluated to make sure their blood is safe. This is blood bank blood. Transfusion therapy is the safest it has ever been in the practice of medicine. Before blood is taken from a donor, a complete history is taken to make sure that person has no history of diseases nor engages in risky social behavior (examples are intravenous drug use or sexual activity with multiple partners). The donor's travel history is screened to minimize risk of transmitting infections, such as malaria. The donated blood is tested for signs of infectious diseases, such as HIV and hepatitis. The blood is then tested to be sure it is compatible with you in order to minimize the chance of a transfusion reaction. If you or a relative donates blood,  this is often done in anticipation of surgery and is not appropriate for emergency situations. It takes many days to process the donated blood. RISKS AND COMPLICATIONS Although transfusion therapy is very safe and saves many lives, the main dangers of transfusion include:   Getting an infectious disease.  Developing a transfusion reaction. This is an allergic reaction to something in the blood you were given. Every precaution is taken to prevent this. The decision to have a blood transfusion has been considered carefully by your caregiver before blood is given. Blood is not given unless the benefits outweigh the risks.  After surgery instructions  11/18/2018  Return to work: 4-6 weeks if applicable  Activity: 1. Be up and out of the bed during the day.  Take a nap if needed.  You may walk up steps but be careful and use the hand rail.  Stair climbing will tire you more than you think, you may need to stop part way and rest.   2. No lifting or straining for 6 weeks.  3. No driving for 1 week(s).  Do not  drive if you are taking narcotic pain medicine.  4. Shower daily.  Use soap and water on your incision and pat dry; don't rub.  No tub baths until cleared by your surgeon.   5. No sexual activity and nothing in the vagina for 8 weeks.  6. You may experience a small amount of clear drainage from your incisions, which is normal.  If the drainage persists or increases, please call the office.  7. You may experience vaginal spotting after surgery or around the 6-8 week mark from surgery when the stitches at the top of the vagina begin to dissolve.  The spotting is normal but if you experience heavy bleeding, call our office.  8. Take Tylenol or ibuprofen first for pain and only use stronger pain meds for severe pain not relieved by the Tylenol or Ibuprofen.  Monitor your Tylenol intake to a max of 4,000 mg a day.  Diet: 1. Low sodium Heart Healthy Diet is recommended.  2. It is safe to  use a laxative, such as Miralax or Colace, if you have difficulty moving your bowels. You can take Sennakot at bedtime every evening to keep bowel movements regular and to prevent constipation.    Wound Care: 1. Keep clean and dry.  Shower daily.  Reasons to call the Doctor:  Fever - Oral temperature greater than 100.4 degrees Fahrenheit  Foul-smelling vaginal discharge  Difficulty urinating  Nausea and vomiting  Increased pain at the site of the incision that is unrelieved with pain medicine.  Difficulty breathing with or without chest pain  New calf pain especially if only on one side  Sudden, continuing increased vaginal bleeding with or without clots.   Contacts: For questions or concerns you should contact:  Dr. Everitt [name] at [phone]  Elizabeth John, NP at [phone]  After Hours: call [phone] and have the GYN Oncologist paged/contacted

## 2018-11-19 ENCOUNTER — Telehealth: Payer: Self-pay | Admitting: *Deleted

## 2018-11-19 NOTE — Telephone Encounter (Signed)
Per Dr. Denman George I have fax office note to Dr. Ulanda Edison

## 2018-11-20 ENCOUNTER — Ambulatory Visit (HOSPITAL_COMMUNITY)
Admission: RE | Admit: 2018-11-20 | Discharge: 2018-11-20 | Disposition: A | Discharge status: Home / Self Care | Payer: Medicare Other | Source: Ambulatory Visit | Attending: Gynecologic Oncology | Admitting: Gynecologic Oncology

## 2018-11-20 DIAGNOSIS — Z6841 Body Mass Index (BMI) 40.0 and over, adult: Secondary | ICD-10-CM | POA: Insufficient documentation

## 2018-11-20 DIAGNOSIS — Z853 Personal history of malignant neoplasm of breast: Secondary | ICD-10-CM | POA: Insufficient documentation

## 2018-11-20 DIAGNOSIS — R0600 Dyspnea, unspecified: Secondary | ICD-10-CM | POA: Diagnosis not present

## 2018-11-20 DIAGNOSIS — I509 Heart failure, unspecified: Secondary | ICD-10-CM | POA: Diagnosis not present

## 2018-11-20 NOTE — Progress Notes (Signed)
  Echocardiogram 2D Echocardiogram has been performed.  Elizabeth [name] 11/20/2018, 11:19 AM

## 2018-11-21 DIAGNOSIS — F251 Schizoaffective disorder, depressive type: Secondary | ICD-10-CM | POA: Diagnosis not present

## 2018-12-04 ENCOUNTER — Encounter: Payer: Self-pay | Admitting: Cardiology

## 2018-12-04 ENCOUNTER — Ambulatory Visit (INDEPENDENT_AMBULATORY_CARE_PROVIDER_SITE_OTHER): Payer: Medicare Other | Admitting: Cardiology

## 2018-12-04 ENCOUNTER — Other Ambulatory Visit: Payer: Self-pay

## 2018-12-04 VITALS — BP 141/59 | HR 72 | Temp 97.1°F | Ht 60.0 in | Wt 222.0 lb

## 2018-12-04 DIAGNOSIS — Z01810 Encounter for preprocedural cardiovascular examination: Secondary | ICD-10-CM

## 2018-12-04 DIAGNOSIS — Z6841 Body Mass Index (BMI) 40.0 and over, adult: Secondary | ICD-10-CM | POA: Diagnosis not present

## 2018-12-04 DIAGNOSIS — I272 Pulmonary hypertension, unspecified: Secondary | ICD-10-CM

## 2018-12-04 DIAGNOSIS — C55 Malignant neoplasm of uterus, part unspecified: Secondary | ICD-10-CM

## 2018-12-04 MED ORDER — FUROSEMIDE 40 MG PO TABS: 40.0000 mg | ORAL_TABLET | Freq: Every day | ORAL | 2 refills | Status: DC

## 2018-12-04 NOTE — Progress Notes (Signed)
Primary Physician:  Benito Mccreedy, MD   Patient ID: Elizabeth [name], female    DOB: 1945-04-08, 73 y.o.   MRN: [id number]  Subjective:    Chief Complaint  Patient presents with  . New Patient (Initial Visit)  . Shortness of Breath    HPI: Elizabeth [name]  is a 73 y.o. female  with history of left breast cancer in 2011 s/p mastectomy and is now in remission, hypertension, prediabetes, referred by Dr. Vista Lawman for perioperative cardiac risk stratification for upcoming hysterectomy for uterine adenocarcinoma. There is apparently a vague history of CHF.  She has recently had echocardiogram in Sept 2020, showing normal LVEF, but elevated PA pressures at 54 mmHg.  She does have dyspnea on exertion with walking long distances. No chest pain. She does have to stop and rest due to her breathing. Symptoms started 2-3 months ago. States she has had some improvement with starting inhaler. No PND or orthopnea. No leg swelling. No syncope.  Denies any former tobacco use. States that her brother and sister both have CHF and have had stents placed, but she is unsure of all the details.   Past Medical History:  Diagnosis Date  . Anxiety   . Breast CA ([place])    (Lt) breast ca dx 02/2010  . Breast cancer ([place]) 03/31/2011  . Depression   . GERD (gastroesophageal reflux disease) 12/17/2012  . Hypertension   . Hypertension 12/17/2012  . Nausea alone 12/17/2012  . Schizophrenia [facility])     Past Surgical History:  Procedure Laterality Date  . BREAST SURGERY    . MASTECTOMY Left   . TONSILLECTOMY      Social History   Socioeconomic History  . Marital status: Divorced    Spouse name: Not on file  . Number of children: Not on file  . Years of education: Not on file  . Highest education level: Not on file  Occupational History  . Not on file  Social Needs  . Financial resource strain: Not on file  . Food insecurity    Worry: Not on file    Inability: Not on file  . Transportation  needs    Medical: Not on file    Non-medical: Not on file  Tobacco Use  . Smoking status: Never Smoker  . Smokeless tobacco: Never Used  Substance and Sexual Activity  . Alcohol use: No  . Drug use: No  . Sexual activity: Not Currently  Lifestyle  . Physical activity    Days per week: Not on file    Minutes per session: Not on file  . Stress: Not on file  Relationships  . Social Herbalist on phone: Not on file    Gets together: Not on file    Attends religious service: Not on file    Active member of club or organization: Not on file    Attends meetings of clubs or organizations: Not on file    Relationship status: Not on file  . Intimate partner violence    Fear of current or ex partner: Not on file    Emotionally abused: Not on file    Physically abused: Not on file    Forced sexual activity: Not on file  Other Topics Concern  . Not on file  Social History Narrative  . Not on file    Review of Systems  Constitution: Negative for decreased appetite, malaise/fatigue, weight gain and weight loss.  Eyes: Negative for visual disturbance.  Cardiovascular: Positive for dyspnea on exertion. Negative for chest pain, claudication, leg swelling, orthopnea, palpitations and syncope.  Respiratory: Negative for hemoptysis and wheezing.   Endocrine: Negative for cold intolerance and heat intolerance.  Hematologic/Lymphatic: Does not bruise/bleed easily.  Skin: Negative for nail changes.  Musculoskeletal: Negative for muscle weakness and myalgias.  Gastrointestinal: Negative for abdominal pain, change in bowel habit, nausea and vomiting.  Neurological: Negative for difficulty with concentration, dizziness, focal weakness and headaches.  Psychiatric/Behavioral: Negative for altered mental status and suicidal ideas.  All other systems reviewed and are negative.     Objective:  Blood pressure (!) 141/59, pulse 72, temperature (!) 97.1 F (36.2 C), height 5' (1.524 m),  weight 222 lb (100.7 kg), SpO2 99 %. Body mass index is 43.36 kg/m.     SIX MIN WALK 12/04/2018  Supplimental Oxygen during Test? (L/min) No  Laps 19  Partial Lap (in Meters) 370.6  Baseline Heartrate 80  Baseline SPO2 97  Heartrate 119  SPO2 99  Heartrate 106  SPO2 98  Stopped or Paused before Six Minutes No  Distance Completed 1016.6                Physical Exam  Constitutional: She is oriented to person, place, and time. Vital signs are normal. She appears well-developed and well-nourished.  Morbidly obese  HENT:  Head: Normocephalic and atraumatic.  Neck: Normal range of motion.  Cardiovascular: Normal rate, regular rhythm, normal heart sounds and intact distal pulses.  Pulmonary/Chest: Effort normal and breath sounds normal. No accessory muscle usage. No respiratory distress.  Abdominal: Soft. Bowel sounds are normal.  Musculoskeletal: Normal range of motion.  Neurological: She is alert and oriented to person, place, and time.  Skin: Skin is warm and dry.  Vitals reviewed.  Radiology: No results found.  Laboratory examination:    CMP Latest Ref Rng & Units 03/18/2015 01/14/2015 06/18/2014  Glucose 70 - 140 mg/dl 100 105 108  BUN 7.0 - 26.0 mg/dL 17.7 18.8 17.6  Creatinine 0.6 - 1.1 mg/dL 1.1 1.1 1.1  Sodium 136 - 145 mEq/L 140 139 141  Potassium 3.5 - 5.1 mEq/L 4.8 4.4 4.1  Chloride 96 - 112 mEq/L - - -  CO2 22 - 29 mEq/L 26 25 27   Calcium 8.4 - 10.4 mg/dL 9.7 9.5 9.5  Total Protein 6.4 - 8.3 g/dL 7.5 6.8 7.0  Total Bilirubin 0.20 - 1.20 mg/dL 0.31 <0.30 0.30  Alkaline Phos 40 - 150 U/L 90 93 86  AST 5 - 34 U/L 17 16 15   ALT 0 - 55 U/L 17 18 16    CBC Latest Ref Rng & Units 03/18/2015 01/14/2015 06/18/2014  WBC 3.9 - 10.3 10e3/uL 7.3 7.3 8.2  Hemoglobin 11.6 - 15.9 g/dL 11.8 11.7 11.9  Hematocrit 34.8 - 46.6 % 35.9 36.1 36.7  Platelets 145 - 400 10e3/uL 215 213 205   Lipid Panel  No results found for: CHOL, TRIG, HDL, CHOLHDL, VLDL, LDLCALC, LDLDIRECT  HEMOGLOBIN A1C No results found for: HGBA1C, MPG TSH No results for input(s): TSH in the last 8760 hours.  PRN Meds:. Medications Discontinued During This Encounter  Medication Reason  . furosemide (LASIX) 20 MG tablet Discontinued by provider   Current Meds  Medication Sig  . ARIPiprazole (ABILIFY) 15 MG tablet Take 15 mg by mouth at bedtime.   . benztropine (COGENTIN) 0.5 MG tablet Take 0.5 mg by mouth daily.   . CRESTOR 20 MG tablet Take 20 mg by mouth daily.   Marland Kitchen  escitalopram (LEXAPRO) 20 MG tablet Take 20 mg by mouth at bedtime.   Marland Kitchen lisinopril (ZESTRIL) 10 MG tablet Take 10 mg by mouth daily.   . megestrol (MEGACE) 40 MG tablet Take 1 tablet (40 mg total) by mouth daily.  Marland Kitchen senna-docusate (SENOKOT-S) 8.6-50 MG tablet Take 2 tablets by mouth at bedtime. For AFTER surgery, do not take if having diarrhea  . spironolactone (ALDACTONE) 25 MG tablet Take 25 mg by mouth daily.  . traMADol (ULTRAM) 50 MG tablet Take 1 tablet (50 mg total) by mouth every 6 (six) hours as needed for severe pain. For AFTER surgery, only do not take and drive  . VENTOLIN HFA 108 (90 Base) MCG/ACT inhaler Inhale 2 puffs into the lungs every 6 (six) hours as needed for wheezing or shortness of breath.   . [DISCONTINUED] aspirin 325 MG tablet Take 325 mg by mouth daily.  . [DISCONTINUED] Cholecalciferol (D3-50) 1.25 MG (50000 UT) capsule Take 50,000 Units by mouth daily.  . [DISCONTINUED] Cyanocobalamin (VITAMIN B12 PO) Take 1 capsule by mouth.  . [DISCONTINUED] furosemide (LASIX) 20 MG tablet Take 20 mg by mouth daily.  . [DISCONTINUED] Multiple Vitamin (MULTIVITAMIN WITH MINERALS) TABS tablet Take 1 tablet by mouth daily.    Cardiac Studies:   Echo 11/20/2018:  1. The left ventricle has normal systolic function with an ejection fraction of 60-65%. The cavity size was normal.  2. The right ventricle has normal systolic function. The cavity was normal. There is No increase in right ventricular wall thickness.  Right ventricular systolic pressure is moderately elevated with an estimated pressure of 54.4 mmHg.  3. Left atrial size was normal.  4. Right atrial size was normal.  5. The aortic valve is grossly normal. Aortic valve regurgitation was not visualized by color flow Doppler. No stenosis of the aortic valve. Mild to moderate aortic annular calcification noted.  6. The pulmonic valve was not well visualized. Pulmonic valve regurgitation was not assessed by color flow Doppler.  7. The aorta is normal unless otherwise noted.  8. The aortic root and ascending aorta are normal in size and structure.  9. The atrial septum is grossly normal. 10. The average left ventricular global longitudinal strain is -15.8 %.  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 Pulmonary hypertension is likely secondary from obesity; however, will need to exclude primary hypertension. Although she is at moderate risk for surgery, do not feel that the benefits outweigh the risk to delay her surgery, as we would likely not have significant improvement in her pulmonary hypertension in just a few weeks and her surgery needs to be performed soon in view of adenocarcinoma. Did not have significant drop in Sp02 with 6 min walk test.I will further increase her lasix to 40 mg daily, has minimal leg edema.  Although she does have risk factors for CAD, she is without exertional chest pain and is able to do some walking and riding her bicycle without any significant symptoms. Do not feel that she needs stress testing at  this point. I will plan to see her back in 3 months for follow up, but  encouraged her to contact me sooner if needed.   *I have discussed this case with Dr. Virgina Jock and he personally examined the patient and participated in formulating the plan.*   Miquel Dunn, MSN, APRN, FNP-C [facility]. [place] Office: [phone] Fax: [phone]

## 2018-12-05 HISTORY — PX: HYSTEROTOMY: SHX1776

## 2018-12-07 DIAGNOSIS — Z23 Encounter for immunization: Secondary | ICD-10-CM | POA: Diagnosis not present

## 2018-12-08 ENCOUNTER — Encounter: Payer: Self-pay | Admitting: Cardiology

## 2018-12-09 NOTE — Patient Instructions (Addendum)
DUE TO COVID-19 ONLY ONE VISITOR IS ALLOWED TO COME WITH YOU AND STAY IN THE WAITING ROOM ONLY DURING PRE OP AND PROCEDURE DAY OF SURGERY. THE 1 VISITOR MAY VISIT WITH YOU AFTER SURGERY IN YOUR PRIVATE ROOM DURING VISITING HOURS ONLY!  YOU NEED TO HAVE A COVID 19 TEST ON__10/9_____ @_2 :50 PM______, THIS TEST MUST BE DONE BEFORE SURGERY, COME  [address], [place] , [address].  ([place]) ONCE YOUR COVID TEST IS COMPLETED, PLEASE BEGIN THE QUARANTINE INSTRUCTIONS AS OUTLINED IN YOUR HANDOUT.                Kathrene Bongo   Your procedure is scheduled on: 12/17/18   Report to [facility] Main  Entrance   Report to admitting at   8:30 AM     Call this number if you have problems the morning of surgery [place], [place].  Eat a light diet the day before surgery   Full Liquid Diet   Strained creamy soups Tea, Coffee- with cream or mild and sugar or honey  Juices- cranberry , grape and apple  Jello  Milkshakes  Pudding , custards  Popsicles  Water Plain ice cream f, frozen yogurt, sherbet, plain yogurt  Fruit ices and popsicles with no fruit pulp  Sugar, honey and syrups Clear broths  Boost, Ensure, Resource and other liquid supplements NO CARBONATED BEVERAGES   No solid food after Midnight. Clear liquids until 7:30 AM    CLEAR LIQUID DIET   Foods Allowed                                                                     Foods Excluded  Coffee and tea, regular and decaf                             liquids that you cannot  Plain Jell-O any favor except red or purple                                           see through such as: Fruit ices (not with fruit pulp)                                     milk, soups, orange juice  Iced Popsicles                                    All solid food Carbonated beverages, regular and diet                                     Cranberry, grape and apple juices Sports drinks like Gatorade Lightly seasoned clear broth or consume(fat free) Sugar, honey syrup    __Nothing by mouth after 7:30 AM  the morning of surgery___________________________________________________________________    Take these medicines the morning of surgery with A SIP OF WATER: Megace,Cogentin, Flonase if needed,                                use your inhaler and bring it to the hospital with you.                                 You may not have any metal on your body including hair pins and              piercings            Do not wear jewelry, make-up, lotions, powders or perfumes, deodorant             Do not wear nail polish on your fingernails.  Do not shave  48 hours prior to surgery.              Do not bring valuables to the hospital. [place].  Contacts, dentures or bridgework may not be worn into surgery.      Patients discharged the day of surgery will not be allowed to drive home . IF YOU ARE HAVING SURGERY AND GOING HOME THE SAME DAY, YOU MUST HAVE AN ADULT TO DRIVE YOU HOME AND BE WITH YOU FOR 24 HOURS.  YOU MAY GO HOME BY TAXI OR UBER OR ORTHERWISE, BUT AN ADULT MUST ACCOMPANY YOU HOME AND STAY WITH YOU FOR 24 HOURS.  Name and phone number of your driver:  Special Instructions: N/A              Please read over the following fact sheets you were given: _____________________________________________________________________             [facility] - Preparing for Surgery  Before surgery, you can play an important role .  Because skin is not sterile, your skin needs to be as free of germs as possible.   You can reduce the number of germs on your skin by washing with CHG (chlorahexidine gluconate) soap before surgery.  CHG is an antiseptic cleaner which kills germs and bonds with the skin to continue killing germs even after washing. Please DO NOT use if  you have an allergy to CHG or antibacterial soaps .  If your skin becomes reddened/irritated stop using the CHG and inform your nurse when you arrive at Short Stay. Do not shave (including legs and underarms) for at least 48 hours prior to the first CHG shower.. Please follow these instructions carefully:   1.  Shower with CHG Soap the night before surgery and the  morning of Surgery.  2.  If you choose to wash your hair, wash your hair first as usual with your  normal  shampoo.  3.  After you shampoo, rinse your hair and body thoroughly to remove the  shampoo.                                        4.  Use CHG as you would any other liquid soap.  You can apply chg directly  to the skin and wash  Gently with a scrungie or clean washcloth.  5.  Apply the CHG Soap to your body ONLY FROM THE NECK DOWN.   Do not use on face/ open                           Wound or open sores. Avoid contact with eyes, ears mouth and genitals (private parts).                       Wash face,  Genitals (private parts) with your normal soap.             6.  Wash thoroughly, paying special attention to the area where your surgery  will be performed.  7.  Thoroughly rinse your body with warm water from the neck down.  8.  DO NOT shower/wash with your normal soap after using and rinsing off  the CHG Soap.                9.  Pat yourself dry with a clean towel.            10.  Wear clean pajamas.            11.  Place clean sheets on your bed the night of your first shower and do not  sleep with pets. Day of Surgery : Do not apply any lotions/deodorants the morning of surgery.  Please wear clean clothes to the hospital/surgery center.  FAILURE TO FOLLOW THESE INSTRUCTIONS MAY RESULT IN THE CANCELLATION OF YOUR SURGERY PATIENT SIGNATURE_________________________________  NURSE  SIGNATURE__________________________________  ________________________________________________________________________   Adam Phenix  An incentive spirometer is a tool that can help keep your lungs clear and active. This tool measures how well you are filling your lungs with each breath. Taking long deep breaths may help reverse or decrease the chance of developing breathing (pulmonary) problems (especially infection) following:  A long period of time when you are unable to move or be active. BEFORE THE PROCEDURE   If the spirometer includes an indicator to show your best effort, your nurse or respiratory therapist will set it to a desired goal.  If possible, sit up straight or lean slightly forward. Try not to slouch.  Hold the incentive spirometer in an upright position. INSTRUCTIONS FOR USE  1. Sit on the edge of your bed if possible, or sit up as far as you can in bed or on a chair. 2. Hold the incentive spirometer in an upright position. 3. Breathe out normally. 4. Place the mouthpiece in your mouth and seal your lips tightly around it. 5. Breathe in slowly and as deeply as possible, raising the piston or the ball toward the top of the column. 6. Hold your breath for 3-5 seconds or for as long as possible. Allow the piston or ball to fall to the bottom of the column. 7. Remove the mouthpiece from your mouth and breathe out normally. 8. Rest for a few seconds and repeat Steps 1 through 7 at least 10 times every 1-2 hours when you are awake. Take your time and take a few normal breaths between deep breaths. 9. The spirometer may include an indicator to show your best effort. Use the indicator as a goal to work toward during each repetition. 10. After each set of 10 deep breaths, practice coughing to be sure your lungs are clear. If you have an incision (the cut made at the time of surgery),  support your incision when coughing by placing a pillow or rolled up towels firmly  against it. Once you are able to get out of bed, walk around indoors and cough well. You may stop using the incentive spirometer when instructed by your caregiver.  RISKS AND COMPLICATIONS  Take your time so you do not get dizzy or light-headed.  If you are in pain, you may need to take or ask for pain medication before doing incentive spirometry. It is harder to take a deep breath if you are having pain. AFTER USE  Rest and breathe slowly and easily.  It can be helpful to keep track of a log of your progress. Your caregiver can provide you with a simple table to help with this. If you are using the spirometer at home, follow these instructions: [place] IF:   You are having difficultly using the spirometer.  You have trouble using the spirometer as often as instructed.  Your pain medication is not giving enough relief while using the spirometer.  You develop fever of 100.5 F (38.1 C) or higher. SEEK IMMEDIATE MEDICAL CARE IF:   You cough up bloody sputum that had not been present before.  You develop fever of 102 F (38.9 C) or greater.  You develop worsening pain at or near the incision site. MAKE SURE YOU:   Understand these instructions.  Will watch your condition.  Will get help right away if you are not doing well or get worse. Document Released: 07/03/2006 Document Revised: 05/15/2011 Document Reviewed: 09/03/2006 [facility] Patient Information 2014 [place], [place].   ________________________________________________________________________

## 2018-12-11 ENCOUNTER — Ambulatory Visit
Admission: RE | Admit: 2018-12-11 | Discharge: 2018-12-11 | Disposition: A | Discharge status: Home / Self Care | Payer: Medicare Other | Source: Ambulatory Visit | Attending: Adult Health | Admitting: Adult Health

## 2018-12-11 ENCOUNTER — Encounter (HOSPITAL_COMMUNITY)
Admission: RE | Admit: 2018-12-11 | Discharge: 2018-12-11 | Disposition: A | Discharge status: Home / Self Care | Payer: Medicare Other | Source: Ambulatory Visit | Attending: Gynecologic Oncology | Admitting: Gynecologic Oncology

## 2018-12-11 ENCOUNTER — Other Ambulatory Visit: Payer: Self-pay

## 2018-12-11 ENCOUNTER — Encounter (HOSPITAL_COMMUNITY): Payer: Self-pay

## 2018-12-11 DIAGNOSIS — Z9012 Acquired absence of left breast and nipple: Secondary | ICD-10-CM | POA: Insufficient documentation

## 2018-12-11 DIAGNOSIS — Z79899 Other long term (current) drug therapy: Secondary | ICD-10-CM | POA: Insufficient documentation

## 2018-12-11 DIAGNOSIS — F329 Major depressive disorder, single episode, unspecified: Secondary | ICD-10-CM | POA: Diagnosis not present

## 2018-12-11 DIAGNOSIS — F209 Schizophrenia, unspecified: Secondary | ICD-10-CM | POA: Diagnosis not present

## 2018-12-11 DIAGNOSIS — C541 Malignant neoplasm of endometrium: Secondary | ICD-10-CM | POA: Diagnosis not present

## 2018-12-11 DIAGNOSIS — I10 Essential (primary) hypertension: Secondary | ICD-10-CM | POA: Diagnosis not present

## 2018-12-11 DIAGNOSIS — Z01812 Encounter for preprocedural laboratory examination: Secondary | ICD-10-CM | POA: Insufficient documentation

## 2018-12-11 DIAGNOSIS — Z853 Personal history of malignant neoplasm of breast: Secondary | ICD-10-CM | POA: Insufficient documentation

## 2018-12-11 DIAGNOSIS — F419 Anxiety disorder, unspecified: Secondary | ICD-10-CM | POA: Insufficient documentation

## 2018-12-11 DIAGNOSIS — Z1231 Encounter for screening mammogram for malignant neoplasm of breast: Secondary | ICD-10-CM | POA: Diagnosis not present

## 2018-12-11 LAB — COMPREHENSIVE METABOLIC PANEL
ALT: 23 U/L (ref 0–44)
AST: 18 U/L (ref 15–41)
Albumin: 4.1 g/dL (ref 3.5–5.0)
Alkaline Phosphatase: 66 U/L (ref 38–126)
Anion gap: 5 (ref 5–15)
BUN: 22 mg/dL (ref 8–23)
CO2: 25 mmol/L (ref 22–32)
Calcium: 9.4 mg/dL (ref 8.9–10.3)
Chloride: 104 mmol/L (ref 98–111)
Creatinine, Ser: 1 mg/dL (ref 0.44–1.00)
GFR calc Af Amer: >60 mL/min (ref >60)
GFR calc non Af Amer: 55 mL/min — ABNORMAL LOW (ref >60)
Glucose, Bld: 98 mg/dL (ref 70–99)
Potassium: 4.4 mmol/L (ref 3.5–5.1)
Sodium: 134 mmol/L — ABNORMAL LOW (ref 135–145)
Total Bilirubin: 0.6 mg/dL (ref 0.3–1.2)
Total Protein: 7.4 g/dL (ref 6.5–8.1)

## 2018-12-11 LAB — URINALYSIS, ROUTINE W REFLEX MICROSCOPIC
Bilirubin Urine: NEGATIVE
Glucose, UA: NEGATIVE mg/dL
Ketones, ur: NEGATIVE mg/dL
Nitrite: NEGATIVE
Protein, ur: NEGATIVE mg/dL
Specific Gravity, Urine: 1.014 (ref 1.005–1.030)
pH: 5 (ref 5.0–8.0)

## 2018-12-11 LAB — CBC
HCT: 40.8 % (ref 36.0–46.0)
Hemoglobin: 12.9 g/dL (ref 12.0–15.0)
MCH: 28.7 pg (ref 26.0–34.0)
MCHC: 31.6 g/dL (ref 30.0–36.0)
MCV: 90.7 fL (ref 80.0–100.0)
Platelets: 216 10*3/uL (ref 150–400)
RBC: 4.5 MIL/uL (ref 3.87–5.11)
RDW: 14.2 % (ref 11.5–15.5)
WBC: 7.1 10*3/uL (ref 4.0–10.5)
nRBC: 0 % (ref 0.0–0.2)

## 2018-12-11 LAB — ABO/RH: ABO/RH(D): A POS

## 2018-12-11 NOTE — Progress Notes (Signed)
   12/11/18 [id number]  OBSTRUCTIVE SLEEP APNEA  Have you ever been diagnosed with sleep apnea through a sleep study? Yes  If yes, do you have and use a CPAP or BPAP machine every night? 0  Do you snore loudly (loud enough to be heard through closed doors)?  1  Do you often feel tired, fatigued, or sleepy during the daytime (such as falling asleep during driving or talking to someone)? 1  Has anyone observed you stop breathing during your sleep? 0  Do you have, or are you being treated for high blood pressure? 1  BMI more than 35 kg/m2? 1  Age > 50 (1-yes) 1  Neck circumference greater than:Female 16 inches or larger, Female 17inches or larger? 0  Female Gender (Yes=1) 0  Obstructive Sleep Apnea Score 5  Score 5 or greater  Results sent to PCP

## 2018-12-11 NOTE — Progress Notes (Signed)
PCP - Dr. Darnell Level. [place] Cardiologist - none  Chest x-ray - no EKG - 12/04/18 Stress Test - no ECHO - 11/20/18 Cardiac Cath - no  Sleep Study - yes. Pt doesn't know what the results were. Stop Band score 5. infor routed to PCP CPAP - no  Fasting Blood Sugar -  Checks Blood Sugar _____ times a day  Blood Thinner Instructions: Aspirin Instructions: Last Dose:  Anesthesia review:   Patient denies shortness of breath, fever, cough and chest pain at PAT appointment yes  Patient verbalized understanding of instructions that were given to them at the PAT appointment. Patient was also instructed that they will need to review over the PAT instructions again at home before surgery. yes

## 2018-12-12 LAB — URINE CULTURE: Culture: <10000 — AB

## 2018-12-12 NOTE — Progress Notes (Signed)
Anesthesia Chart Review   Case: [id number] Date/Time: 12/17/18 0700   Procedures:      XI ROBOTIC ASSISTED TOTAL HYSTERECTOMY WITH BILATERAL SALPINGO OOPHORECTOMY (Bilateral )     SENTINEL NODE BIOPSY (N/A )   Anesthesia type: General   Pre-op diagnosis: ENDOMETRIAL CANCER   Location: [facility]   Surgeon: Everitt Amber, MD      DISCUSSION:73 y.o. never smoker with h/o HTN, GERD, schizophrenia, anxiety, breast cancer 2011 s/p mastectomy in remission, endometrial cancer scheduled for above procedure 12/17/2018 with Dr. Everitt Amber.   Pt seen by cardiology for preoperative evaluation.  Per Jeri Lager, NP, "I have reviewed her echocardiogram, no evidence of CHF, but does have some pulmonary hypertension that will need further workup. Pulmonary hypertension is likely secondary from obesity; however, will need to exclude primary hypertension. Although she is at moderate risk for surgery, do not feel that the benefits outweigh the risk to delay her surgery, as we would likely not have significant improvement in her pulmonary hypertension in just a few weeks and her surgery needs to be performed soon in view of adenocarcinoma. Did not have significant drop in Sp02 with 6 min walk test.I will further increase her lasix to 40 mg daily, has minimal leg edema. Although she does have risk factors for CAD, she is without exertional chest pain and is able to do some walking and riding her bicycle without any significant symptoms. Do not feel that she needs stress testing at this point. I will plan to see her back in 3 months for follow up, but encouraged her to contact me sooner if needed. *I have discussed this case with Dr. Virgina Jock and he personally examined the patient and participated in formulating the plan.*"  Anticipate pt can proceed with planned procedure barring acute status change.   VS: BP (!) 131/56   Pulse 65   Temp 36.8 C (Oral)   Resp 18   Ht 5\' 5"  (1.651 m)   Wt 100.5 kg   SpO2  100%   BMI 36.88 kg/m   PROVIDERS: Benito Mccreedy, MD is PCP   Vernell Leep, MD is Cardiologist  LABS: Labs reviewed: Acceptable for surgery. (all labs ordered are listed, but only abnormal results are displayed)  Labs Reviewed  COMPREHENSIVE METABOLIC PANEL - Abnormal; Notable for the following components:      Result Value   Sodium 134 (*)    GFR calc non Af Amer 55 (*)    All other components within normal limits  URINALYSIS, ROUTINE W REFLEX MICROSCOPIC - Abnormal; Notable for the following components:   Hgb urine dipstick SMALL (*)    Leukocytes,Ua SMALL (*)    Bacteria, UA RARE (*)    All other components within normal limits  CBC  TYPE AND SCREEN     IMAGES:   EKG: 12/04/2018 Rate 72 bpm Normal sinus rhythm at 72 bpm, normal axis, no evidence of ischemia  CV: Echo 11/20/2018 IMPRESSIONS   1. The left ventricle has normal systolic function with an ejection fraction of 60-65%. The cavity size was normal.  2. The right ventricle has normal systolic function. The cavity was normal. There is No increase in right ventricular wall thickness. Right ventricular systolic pressure is moderately elevated with an estimated pressure of 54.4 mmHg.  3. Left atrial size was normal.  4. Right atrial size was normal.  5. The aortic valve is grossly normal. Aortic valve regurgitation was not visualized by color flow Doppler.  No stenosis of the aortic valve. Mild to moderate aortic annular calcification noted.  6. The pulmonic valve was not well visualized. Pulmonic valve regurgitation was not assessed by color flow Doppler.  7. The aorta is normal unless otherwise noted.  8. The aortic root and ascending aorta are normal in size and structure.  9. The atrial septum is grossly normal. 10. The average left ventricular global longitudinal strain is -15.8 %. Past Medical History:  Diagnosis Date  . Anxiety   . Breast CA ([place])    (Lt) breast ca dx 02/2010  . Breast cancer  ([place]) 03/31/2011  . Depression   . GERD (gastroesophageal reflux disease) 12/17/2012  . Hypertension   . Hypertension 12/17/2012  . Nausea alone 12/17/2012  . Schizophrenia [facility])     Past Surgical History:  Procedure Laterality Date  . BREAST SURGERY    . MASTECTOMY Left   . TONSILLECTOMY      MEDICATIONS: . ARIPiprazole (ABILIFY) 15 MG tablet  . benztropine (COGENTIN) 0.5 MG tablet  . Cholecalciferol (VITAMIN D) 125 MCG (5000 UT) CAPS  . CRESTOR 20 MG tablet  . escitalopram (LEXAPRO) 20 MG tablet  . fluticasone (FLONASE) 50 MCG/ACT nasal spray  . furosemide (LASIX) 40 MG tablet  . hydrocortisone cream 1 %  . lisinopril (ZESTRIL) 10 MG tablet  . megestrol (MEGACE) 40 MG tablet  . senna-docusate (SENOKOT-S) 8.6-50 MG tablet  . spironolactone (ALDACTONE) 25 MG tablet  . traMADol (ULTRAM) 50 MG tablet  . VENTOLIN HFA 108 (90 Base) MCG/ACT inhaler   No current facility-administered medications for this encounter.    Maia Plan [facility] Pre-Surgical Testing [phone] 12/12/18  3:56 PM

## 2018-12-12 NOTE — Anesthesia Preprocedure Evaluation (Addendum)
Anesthesia Evaluation  Patient identified by MRN, date of birth, ID band Patient awake    Reviewed: Allergy & Precautions, NPO status , Patient's Chart, lab work & pertinent test results  Airway Mallampati: I  TM Distance: >3 FB Neck ROM: Full    Dental   Pulmonary    Pulmonary exam normal        Cardiovascular hypertension, Pt. on medications Normal cardiovascular exam     Neuro/Psych Anxiety Depression Schizophrenia    GI/Hepatic GERD  Medicated and Controlled,  Endo/Other    Renal/GU      Musculoskeletal   Abdominal   Peds  Hematology   Anesthesia Other Findings   Reproductive/Obstetrics                            Anesthesia Physical Anesthesia Plan  ASA: III  Anesthesia Plan: General   Post-op Pain Management:    Induction: Intravenous  PONV Risk Score and Plan: 3 and Ondansetron, Midazolam and Treatment may vary due to age or medical condition  Airway Management Planned: Oral ETT  Additional Equipment:   Intra-op Plan:   Post-operative Plan: Extubation in OR  Informed Consent: I have reviewed the patients History and Physical, chart, labs and discussed the procedure including the risks, benefits and alternatives for the proposed anesthesia with the patient or authorized representative who has indicated his/her understanding and acceptance.       Plan Discussed with: CRNA and Surgeon  Anesthesia Plan Comments: (See PAT note 12/11/2018, Konrad Felix, PA-C)       Anesthesia Quick Evaluation

## 2018-12-13 ENCOUNTER — Other Ambulatory Visit (HOSPITAL_COMMUNITY)
Admission: RE | Admit: 2018-12-13 | Discharge: 2018-12-13 | Disposition: A | Discharge status: Home / Self Care | Payer: Medicare Other | Source: Ambulatory Visit | Attending: Gynecologic Oncology | Admitting: Gynecologic Oncology

## 2018-12-13 DIAGNOSIS — Z20828 Contact with and (suspected) exposure to other viral communicable diseases: Secondary | ICD-10-CM | POA: Diagnosis not present

## 2018-12-13 DIAGNOSIS — Z01812 Encounter for preprocedural laboratory examination: Secondary | ICD-10-CM | POA: Diagnosis not present

## 2018-12-16 ENCOUNTER — Telehealth: Payer: Self-pay

## 2018-12-16 LAB — NOVEL CORONAVIRUS, NAA (HOSP ORDER, SEND-OUT TO REF LAB; TAT 18-24 HRS): SARS-CoV-2, NAA: NOT DETECTED

## 2018-12-16 NOTE — Progress Notes (Signed)
Spoke with patient by phone, patient aware arrive 530 am [facility] short stay, drink ensure drink at 430 am.

## 2018-12-16 NOTE — Telephone Encounter (Signed)
Daughter stated that they have the written instructions and picked up medications for post op at home.

## 2018-12-17 ENCOUNTER — Ambulatory Visit (HOSPITAL_COMMUNITY)
Admission: RE | Admit: 2018-12-17 | Discharge: 2018-12-17 | Disposition: A | Discharge status: Home / Self Care | Payer: Medicare Other | Source: Other Acute Inpatient Hospital | Attending: Gynecologic Oncology | Admitting: Gynecologic Oncology

## 2018-12-17 ENCOUNTER — Encounter (HOSPITAL_COMMUNITY)
Admission: RE | Disposition: A | Discharge status: Home / Self Care | Payer: Self-pay | Source: Other Acute Inpatient Hospital | Attending: Gynecologic Oncology

## 2018-12-17 ENCOUNTER — Encounter (HOSPITAL_COMMUNITY): Payer: Self-pay

## 2018-12-17 ENCOUNTER — Other Ambulatory Visit: Payer: Self-pay

## 2018-12-17 ENCOUNTER — Ambulatory Visit (HOSPITAL_COMMUNITY): Payer: Medicare Other | Admitting: Physician Assistant

## 2018-12-17 ENCOUNTER — Ambulatory Visit (HOSPITAL_COMMUNITY): Payer: Medicare Other | Admitting: Certified Registered"

## 2018-12-17 DIAGNOSIS — K219 Gastro-esophageal reflux disease without esophagitis: Secondary | ICD-10-CM | POA: Insufficient documentation

## 2018-12-17 DIAGNOSIS — Z7982 Long term (current) use of aspirin: Secondary | ICD-10-CM | POA: Insufficient documentation

## 2018-12-17 DIAGNOSIS — E7800 Pure hypercholesterolemia, unspecified: Secondary | ICD-10-CM | POA: Insufficient documentation

## 2018-12-17 DIAGNOSIS — C50912 Malignant neoplasm of unspecified site of left female breast: Secondary | ICD-10-CM | POA: Diagnosis not present

## 2018-12-17 DIAGNOSIS — Z9010 Acquired absence of unspecified breast and nipple: Secondary | ICD-10-CM | POA: Insufficient documentation

## 2018-12-17 DIAGNOSIS — K429 Umbilical hernia without obstruction or gangrene: Secondary | ICD-10-CM | POA: Insufficient documentation

## 2018-12-17 DIAGNOSIS — Z6836 Body mass index (BMI) 36.0-36.9, adult: Secondary | ICD-10-CM | POA: Diagnosis not present

## 2018-12-17 DIAGNOSIS — Z79818 Long term (current) use of other agents affecting estrogen receptors and estrogen levels: Secondary | ICD-10-CM | POA: Diagnosis not present

## 2018-12-17 DIAGNOSIS — K660 Peritoneal adhesions (postprocedural) (postinfection): Secondary | ICD-10-CM | POA: Insufficient documentation

## 2018-12-17 DIAGNOSIS — D259 Leiomyoma of uterus, unspecified: Secondary | ICD-10-CM | POA: Insufficient documentation

## 2018-12-17 DIAGNOSIS — Z79899 Other long term (current) drug therapy: Secondary | ICD-10-CM | POA: Insufficient documentation

## 2018-12-17 DIAGNOSIS — F419 Anxiety disorder, unspecified: Secondary | ICD-10-CM | POA: Insufficient documentation

## 2018-12-17 DIAGNOSIS — D251 Intramural leiomyoma of uterus: Secondary | ICD-10-CM | POA: Diagnosis not present

## 2018-12-17 DIAGNOSIS — F329 Major depressive disorder, single episode, unspecified: Secondary | ICD-10-CM | POA: Diagnosis not present

## 2018-12-17 DIAGNOSIS — I509 Heart failure, unspecified: Secondary | ICD-10-CM | POA: Insufficient documentation

## 2018-12-17 DIAGNOSIS — C541 Malignant neoplasm of endometrium: Secondary | ICD-10-CM | POA: Diagnosis not present

## 2018-12-17 DIAGNOSIS — Z170 Estrogen receptor positive status [ER+]: Secondary | ICD-10-CM | POA: Insufficient documentation

## 2018-12-17 DIAGNOSIS — I110 Hypertensive heart disease with heart failure: Secondary | ICD-10-CM | POA: Insufficient documentation

## 2018-12-17 DIAGNOSIS — C55 Malignant neoplasm of uterus, part unspecified: Secondary | ICD-10-CM | POA: Diagnosis present

## 2018-12-17 HISTORY — PX: ROBOTIC ASSISTED TOTAL HYSTERECTOMY WITH BILATERAL SALPINGO OOPHERECTOMY: SHX6086

## 2018-12-17 HISTORY — PX: SENTINEL NODE BIOPSY: SHX6608

## 2018-12-17 LAB — TYPE AND SCREEN
ABO/RH(D): A POS
Antibody Screen: NEGATIVE

## 2018-12-17 SURGERY — HYSTERECTOMY, TOTAL, ROBOT-ASSISTED, LAPAROSCOPIC, WITH BILATERAL SALPINGO-OOPHORECTOMY
Anesthesia: General

## 2018-12-17 MED ORDER — DEXAMETHASONE SODIUM PHOSPHATE 10 MG/ML IJ SOLN
INTRAMUSCULAR | Status: DC | PRN
  Administered 2018-12-17: 10 mg via INTRAVENOUS

## 2018-12-17 MED ORDER — ROCURONIUM BROMIDE 10 MG/ML (PF) SYRINGE
PREFILLED_SYRINGE | INTRAVENOUS | Status: DC | PRN
  Administered 2018-12-17: 100 mg via INTRAVENOUS
  Administered 2018-12-17 (×2): 30 mg via INTRAVENOUS
  Administered 2018-12-17: 20 mg via INTRAVENOUS

## 2018-12-17 MED ORDER — ONDANSETRON HCL 4 MG/2ML IJ SOLN: 4.0000 mg | Freq: Once | INTRAMUSCULAR | Status: DC | PRN

## 2018-12-17 MED ORDER — LIDOCAINE 2% (20 MG/ML) 5 ML SYRINGE
INTRAMUSCULAR | Status: AC
  Filled 2018-12-17: qty 5

## 2018-12-17 MED ORDER — HYDROMORPHONE HCL 1 MG/ML IJ SOLN
0.2500 mg | INTRAMUSCULAR | Status: DC | PRN
  Administered 2018-12-17 (×2): 0.5 mg via INTRAVENOUS

## 2018-12-17 MED ORDER — HYDROMORPHONE HCL 1 MG/ML IJ SOLN
INTRAMUSCULAR | Status: AC
  Filled 2018-12-17: qty 1

## 2018-12-17 MED ORDER — GABAPENTIN 300 MG PO CAPS
300.0000 mg | ORAL_CAPSULE | ORAL | Status: AC
  Administered 2018-12-17: 300 mg via ORAL
  Filled 2018-12-17: qty 1

## 2018-12-17 MED ORDER — PROPOFOL 10 MG/ML IV BOLUS
INTRAVENOUS | Status: DC | PRN
  Administered 2018-12-17: 110 mg via INTRAVENOUS

## 2018-12-17 MED ORDER — MIDAZOLAM HCL 2 MG/2ML IJ SOLN
INTRAMUSCULAR | Status: DC | PRN
  Administered 2018-12-17: 2 mg via INTRAVENOUS

## 2018-12-17 MED ORDER — ROCURONIUM BROMIDE 10 MG/ML (PF) SYRINGE
PREFILLED_SYRINGE | INTRAVENOUS | Status: AC
  Filled 2018-12-17: qty 10

## 2018-12-17 MED ORDER — STERILE WATER FOR IRRIGATION IR SOLN
Status: DC | PRN
  Administered 2018-12-17: 1000 mL

## 2018-12-17 MED ORDER — LACTATED RINGERS IR SOLN
Status: DC | PRN
  Administered 2018-12-17: 1

## 2018-12-17 MED ORDER — ONDANSETRON HCL 4 MG/2ML IJ SOLN
INTRAMUSCULAR | Status: AC
  Filled 2018-12-17: qty 2

## 2018-12-17 MED ORDER — PROPOFOL 10 MG/ML IV BOLUS
INTRAVENOUS | Status: AC
  Filled 2018-12-17: qty 20

## 2018-12-17 MED ORDER — ENOXAPARIN SODIUM 40 MG/0.4ML ~~LOC~~ SOLN
40.0000 mg | SUBCUTANEOUS | Status: AC
  Administered 2018-12-17: 40 mg via SUBCUTANEOUS
  Filled 2018-12-17: qty 0.4

## 2018-12-17 MED ORDER — PHENYLEPHRINE 40 MCG/ML (10ML) SYRINGE FOR IV PUSH (FOR BLOOD PRESSURE SUPPORT)
PREFILLED_SYRINGE | INTRAVENOUS | Status: AC
  Filled 2018-12-17: qty 10

## 2018-12-17 MED ORDER — MIDAZOLAM HCL 2 MG/2ML IJ SOLN
INTRAMUSCULAR | Status: AC
  Filled 2018-12-17: qty 2

## 2018-12-17 MED ORDER — LIDOCAINE 20MG/ML (2%) 15 ML SYRINGE OPTIME
INTRAMUSCULAR | Status: DC | PRN
  Administered 2018-12-17: 1.5 mg/kg/h via INTRAVENOUS

## 2018-12-17 MED ORDER — MEPERIDINE HCL 50 MG/ML IJ SOLN: 6.2500 mg | INTRAMUSCULAR | Status: DC | PRN

## 2018-12-17 MED ORDER — LIDOCAINE 2% (20 MG/ML) 5 ML SYRINGE
INTRAMUSCULAR | Status: DC | PRN
  Administered 2018-12-17: 100 mg via INTRAVENOUS

## 2018-12-17 MED ORDER — SUGAMMADEX SODIUM 500 MG/5ML IV SOLN
INTRAVENOUS | Status: AC
  Filled 2018-12-17: qty 5

## 2018-12-17 MED ORDER — KETAMINE HCL 10 MG/ML IJ SOLN
INTRAMUSCULAR | Status: AC
  Filled 2018-12-17: qty 1

## 2018-12-17 MED ORDER — ACETAMINOPHEN 500 MG PO TABS
1000.0000 mg | ORAL_TABLET | ORAL | Status: AC
  Administered 2018-12-17: 07:00:00 1000 mg via ORAL
  Filled 2018-12-17: qty 2

## 2018-12-17 MED ORDER — KETAMINE HCL 10 MG/ML IJ SOLN
INTRAMUSCULAR | Status: DC | PRN
  Administered 2018-12-17: 30 mg via INTRAVENOUS

## 2018-12-17 MED ORDER — BUPIVACAINE HCL 0.25 % IJ SOLN
INTRAMUSCULAR | Status: DC | PRN
  Administered 2018-12-17: 20 mL

## 2018-12-17 MED ORDER — LACTATED RINGERS IV SOLN
INTRAVENOUS | Status: DC
  Administered 2018-12-17 (×2): via INTRAVENOUS

## 2018-12-17 MED ORDER — BUPIVACAINE HCL (PF) 0.25 % IJ SOLN
INTRAMUSCULAR | Status: AC
  Filled 2018-12-17: qty 30

## 2018-12-17 MED ORDER — CEFAZOLIN SODIUM-DEXTROSE 2-4 GM/100ML-% IV SOLN
2.0000 g | INTRAVENOUS | Status: AC
  Administered 2018-12-17: 2 g via INTRAVENOUS
  Filled 2018-12-17: qty 100

## 2018-12-17 MED ORDER — FENTANYL CITRATE (PF) 250 MCG/5ML IJ SOLN
INTRAMUSCULAR | Status: AC
  Filled 2018-12-17: qty 5

## 2018-12-17 MED ORDER — ESMOLOL HCL 100 MG/10ML IV SOLN
INTRAVENOUS | Status: DC | PRN
  Administered 2018-12-17: 30 mg via INTRAVENOUS
  Administered 2018-12-17: 20 mg via INTRAVENOUS
  Administered 2018-12-17: 30 mg via INTRAVENOUS
  Administered 2018-12-17: 20 mg via INTRAVENOUS

## 2018-12-17 MED ORDER — ONDANSETRON HCL 4 MG/2ML IJ SOLN
INTRAMUSCULAR | Status: DC | PRN
  Administered 2018-12-17: 4 mg via INTRAVENOUS

## 2018-12-17 MED ORDER — SCOPOLAMINE 1 MG/3DAYS TD PT72
1.0000 | MEDICATED_PATCH | TRANSDERMAL | Status: DC
  Administered 2018-12-17: 07:00:00 1.5 mg via TRANSDERMAL
  Filled 2018-12-17: qty 1

## 2018-12-17 MED ORDER — FENTANYL CITRATE (PF) 250 MCG/5ML IJ SOLN
INTRAMUSCULAR | Status: DC | PRN
  Administered 2018-12-17: 50 ug via INTRAVENOUS
  Administered 2018-12-17: 150 ug via INTRAVENOUS

## 2018-12-17 MED ORDER — SUGAMMADEX SODIUM 500 MG/5ML IV SOLN
INTRAVENOUS | Status: DC | PRN
  Administered 2018-12-17: 400 mg via INTRAVENOUS

## 2018-12-17 MED ORDER — STERILE WATER FOR INJECTION IJ SOLN
INTRAMUSCULAR | Status: AC
  Filled 2018-12-17: qty 10

## 2018-12-17 MED ORDER — DEXAMETHASONE SODIUM PHOSPHATE 4 MG/ML IJ SOLN: 4.0000 mg | INTRAMUSCULAR | Status: DC

## 2018-12-17 MED ORDER — SODIUM CHLORIDE 0.9% FLUSH: 3.0000 mL | Freq: Two times a day (BID) | INTRAVENOUS | Status: DC

## 2018-12-17 MED ORDER — DEXAMETHASONE SODIUM PHOSPHATE 10 MG/ML IJ SOLN
INTRAMUSCULAR | Status: AC
  Filled 2018-12-17: qty 1

## 2018-12-17 SURGICAL SUPPLY — 68 items
APPLICATOR SURGIFLO ENDO (HEMOSTASIS) IMPLANT
BAG LAPAROSCOPIC 12 15 PORT 16 (BASKET) IMPLANT
BAG RETRIEVAL 12/15 (BASKET)
BLADE SURG SZ10 CARB STEEL (BLADE) IMPLANT
COVER BACK TABLE 60X90IN (DRAPES) ×3 IMPLANT
COVER TIP SHEARS 8 DVNC (MISCELLANEOUS) ×2 IMPLANT
COVER TIP SHEARS 8MM DA VINCI (MISCELLANEOUS) ×1
COVER WAND RF STERILE (DRAPES) IMPLANT
DECANTER SPIKE VIAL GLASS SM (MISCELLANEOUS) ×6 IMPLANT
DERMABOND ADVANCED (GAUZE/BANDAGES/DRESSINGS) ×1
DERMABOND ADVANCED .7 DNX12 (GAUZE/BANDAGES/DRESSINGS) ×2 IMPLANT
DRAPE ARM DVNC X/XI (DISPOSABLE) ×8 IMPLANT
DRAPE COLUMN DVNC XI (DISPOSABLE) ×2 IMPLANT
DRAPE DA VINCI XI ARM (DISPOSABLE) ×4
DRAPE DA VINCI XI COLUMN (DISPOSABLE) ×1
DRAPE SHEET LG 3/4 BI-LAMINATE (DRAPES) ×3 IMPLANT
DRAPE SURG IRRIG POUCH 19X23 (DRAPES) ×3 IMPLANT
DRSG OPSITE POSTOP 4X6 (GAUZE/BANDAGES/DRESSINGS) IMPLANT
DRSG OPSITE POSTOP 4X8 (GAUZE/BANDAGES/DRESSINGS) IMPLANT
DRSG TEGADERM 2-3/8X2-3/4 SM (GAUZE/BANDAGES/DRESSINGS) ×3 IMPLANT
ELECT REM PT RETURN 15FT ADLT (MISCELLANEOUS) ×3 IMPLANT
GAUZE SPONGE 2X2 8PLY STRL LF (GAUZE/BANDAGES/DRESSINGS) ×2 IMPLANT
GLOVE BIO SURGEON STRL SZ 6 (GLOVE) ×12 IMPLANT
GLOVE BIO SURGEON STRL SZ 6.5 (GLOVE) ×12 IMPLANT
GOWN STRL REUS W/ TWL LRG LVL3 (GOWN DISPOSABLE) ×12 IMPLANT
GOWN STRL REUS W/TWL LRG LVL3 (GOWN DISPOSABLE) ×6
HOLDER FOLEY CATH W/STRAP (MISCELLANEOUS) ×3 IMPLANT
IRRIG SUCT STRYKERFLOW 2 WTIP (MISCELLANEOUS) ×3
IRRIGATION SUCT STRKRFLW 2 WTP (MISCELLANEOUS) ×2 IMPLANT
KIT PROCEDURE DA VINCI SI (MISCELLANEOUS) ×1
KIT PROCEDURE DVNC SI (MISCELLANEOUS) ×2 IMPLANT
KIT TURNOVER KIT A (KITS) ×3 IMPLANT
MANIPULATOR UTERINE 4.5 ZUMI (MISCELLANEOUS) ×3 IMPLANT
NEEDLE HYPO 22GX1.5 SAFETY (NEEDLE) IMPLANT
NEEDLE SPNL 18GX3.5 QUINCKE PK (NEEDLE) IMPLANT
OBTURATOR OPTICAL STANDARD 8MM (TROCAR) ×1
OBTURATOR OPTICAL STND 8 DVNC (TROCAR) ×2
OBTURATOR OPTICALSTD 8 DVNC (TROCAR) ×2 IMPLANT
PACK ROBOT GYN CUSTOM WL (TRAY / TRAY PROCEDURE) ×3 IMPLANT
PAD POSITIONING PINK XL (MISCELLANEOUS) ×3 IMPLANT
PORT ACCESS TROCAR AIRSEAL 12 (TROCAR) ×2 IMPLANT
PORT ACCESS TROCAR AIRSEAL 5M (TROCAR) ×1
POUCH SPECIMEN RETRIEVAL 10MM (ENDOMECHANICALS) IMPLANT
SEAL CANN UNIV 5-8 DVNC XI (MISCELLANEOUS) ×6 IMPLANT
SEAL XI 5MM-8MM UNIVERSAL (MISCELLANEOUS) ×3
SEALER TISSUE G2 STRG ARTC 35C (ENDOMECHANICALS) ×3 IMPLANT
SET TRI-LUMEN FLTR TB AIRSEAL (TUBING) ×3 IMPLANT
SPONGE GAUZE 2X2 STER 10/PKG (GAUZE/BANDAGES/DRESSINGS) ×1
SPONGE LAP 18X18 X RAY DECT (DISPOSABLE) IMPLANT
SURGIFLO W/THROMBIN 8M KIT (HEMOSTASIS) IMPLANT
SUT MNCRL AB 4-0 PS2 18 (SUTURE) IMPLANT
SUT PDS AB 1 TP1 96 (SUTURE) IMPLANT
SUT VIC AB 0 CT1 27 (SUTURE) ×1
SUT VIC AB 0 CT1 27XBRD ANTBC (SUTURE) ×2 IMPLANT
SUT VIC AB 2-0 CT1 27 (SUTURE)
SUT VIC AB 2-0 CT1 TAPERPNT 27 (SUTURE) IMPLANT
SUT VIC AB 3-0 CT1 36 (SUTURE) ×3 IMPLANT
SUT VIC AB 3-0 SH 27 (SUTURE) ×2
SUT VIC AB 3-0 SH 27XBRD (SUTURE) ×4 IMPLANT
SUT VIC AB 4-0 PS2 18 (SUTURE) ×6 IMPLANT
SYR 10ML LL (SYRINGE) IMPLANT
TOWEL OR NON WOVEN STRL DISP B (DISPOSABLE) ×3 IMPLANT
TRAP SPECIMEN MUCOUS 40CC (MISCELLANEOUS) IMPLANT
TRAY FOLEY MTR SLVR 16FR STAT (SET/KITS/TRAYS/PACK) ×3 IMPLANT
TROCAR XCEL NON-BLD 5MMX100MML (ENDOMECHANICALS) IMPLANT
UNDERPAD 30X36 HEAVY ABSORB (UNDERPADS AND DIAPERS) ×3 IMPLANT
WATER STERILE IRR 1000ML POUR (IV SOLUTION) ×3 IMPLANT
YANKAUER SUCT BULB TIP 10FT TU (MISCELLANEOUS) IMPLANT

## 2018-12-17 NOTE — Interval H&P Note (Signed)
History and Physical Interval Note:  12/17/2018 7:15 AM  Elizabeth [name]  has presented today for surgery, with the diagnosis of ENDOMETRIAL CANCER.  The various methods of treatment have been discussed with the patient and family. After consideration of risks, benefits and other options for treatment, the patient has consented to  Procedure(s): XI ROBOTIC ASSISTED TOTAL HYSTERECTOMY WITH BILATERAL SALPINGO OOPHORECTOMY (Bilateral) SENTINEL NODE BIOPSY (N/A) as a surgical intervention.  The patient's history has been reviewed, patient examined, no change in status, stable for surgery.  I have reviewed the patient's chart and labs.  Questions were answered to the patient's satisfaction.     Thereasa Solo

## 2018-12-17 NOTE — Op Note (Signed)
OPERATIVE NOTE 12/17/18  Surgeon: Donaciano Eva   Assistants: Dr Lahoma Crocker (an MD assistant was necessary for tissue manipulation, management of robotic instrumentation, retraction and positioning due to the complexity of the case and hospital policies).   Anesthesia: General endotracheal anesthesia  ASA Class: 3   Pre-operative Diagnosis: endometrial cancer grade 1  Post-operative Diagnosis: same,  Operation: Robotic-assisted laparoscopic total hysterectomy with bilateral salpingoophorectomy, SLN biopsy   Surgeon: Donaciano Eva  Assistant Surgeon: Lahoma Crocker MD  Anesthesia: GET  Urine Output: 300cc  Operative Findings:  : 12cm fibroid uterus, normal tubes and ovaries, extreme retroperitoneal adiposity, no gross extrauterine disease. extreme obesity, BMI 37kg/m2, with significant retroperitoneal and intraperitoneal adiposity. Obesity necessitated an additional 60 minutes of operating time to create safe exposure. Obesity required additional personnel in the operating room for positioning and retraction. Severe obesity substantially increased the complexity of the procedure.   Estimated Blood Loss:  Minimal      Total IV Fluids: 800 ml         Specimens: uterus, cervix, bilateral tubes and ovaries, right and left obturator SLN         Complications:  None; patient tolerated the procedure well.         Disposition: PACU - hemodynamically stable.  Procedure Details  The patient was seen in the Holding Room. The risks, benefits, complications, treatment options, and expected outcomes were discussed with the patient.  The patient concurred with the proposed plan, giving informed consent.  The site of surgery properly noted/marked. The patient was identified as Elizabeth [name] and the procedure verified as a Robotic-assisted hysterectomy with bilateral salpingo oophorectomy with SLN biopsy. A Time Out was held and the above information confirmed.  After  induction of anesthesia, the patient was draped and prepped in the usual sterile manner. Pt was placed in supine position after anesthesia and draped and prepped in the usual sterile manner. The abdominal drape was placed after the CholoraPrep had been allowed to dry for 3 minutes.  Her arms were tucked to her side with all appropriate precautions.  The shoulders were stabilized with padded shoulder blocks applied to the acromium processes.  The patient was placed in the semi-lithotomy position in [place].  The perineum was prepped with Betadine. The patient was then prepped. Foley catheter was placed.  A sterile speculum was placed in the vagina.  The cervix was grasped with a single-tooth tenaculum. 2mg  total of ICG was injected into the cervical stroma at 2 and 9 o'clock with 1cc injected at a 1cm and 3mm depth (concentration 0.5mg /ml) in all locations. The cervix was dilated with Kennon Rounds dilators.  The ZUMI uterine manipulator with a medium colpotomizer ring was placed without difficulty.  A pneum occluder balloon was placed over the manipulator.  OG tube placement was confirmed and to suction.   Next, a 5 mm skin incision was made 1 cm below the subcostal margin in the midclavicular line.  The 5 mm Optiview port and scope was used for direct entry.  Opening pressure was under 10 mm CO2.  The abdomen was insufflated and the findings were noted as above.   At this point and all points during the procedure, the patient's intra-abdominal pressure did not exceed 15 mmHg. Next, a 10 mm skin incision was made in the umbilicus and a right and left port was placed about 10 cm lateral to the robot port on the right and left side.  A fourth arm was placed  in the left lower quadrant 2 cm above and superior and medial to the anterior superior iliac spine.  All ports were placed under direct visualization.  The patient was placed in steep Trendelenburg.  Bowel was folded away into the upper abdomen.  The robot was  docked in the normal manner. The enseal bipolar sealing device was used to take down the omental adhesions with the umbilical hernia.   The right and left peritoneum were opened parallel to the IP ligament to open the retroperitoneal spaces bilaterally. The SLN mapping was performed in bilateral pelvic basins. The para rectal and paravesical spaces were opened up entirely with careful dissection below the level of the ureters bilaterally and to the depth of the uterine artery origin in order to skeletonize the uterine "web" and ensure visualization of all parametrial channels. The para-aortic basins were carefully exposed and evaluated for isolated para-aortic SLN's. Lymphatic channels were identified travelling to the following visualized sentinel lymph node's: right and left obturator SLN. These SLN's were separated from their surrounding lymphatic tissue, removed and sent for permanent pathology.  The hysterectomy was started after the round ligament on the right side was incised and the retroperitoneum was entered and the pararectal space was developed.  The ureter was noted to be on the medial leaf of the broad ligament.  The peritoneum above the ureter was incised and stretched and the infundibulopelvic ligament was skeletonized, cauterized and cut.  The posterior peritoneum was taken down to the level of the KOH ring.  The anterior peritoneum was also taken down.  The bladder flap was created to the level of the KOH ring.  The uterine artery on the right side was skeletonized, cauterized and cut in the normal manner.  A similar procedure was performed on the left.  The colpotomy was made and the uterus, cervix, bilateral ovaries and tubes were amputated and delivered through the vagina.  Pedicles were inspected and excellent hemostasis was achieved.    The colpotomy at the vaginal cuff was closed with Vicryl on a CT1 needle in running manner.  Irrigation was used and excellent hemostasis was achieved.   At this point in the procedure was completed.  Robotic instruments were removed under direct visulaization.  The robot was undocked. The 10 mm port and umbilicus were closed at the fascia with Vicryl on a UR-5 needle and the fascia was closed with 0 Vicryl on a UR-5 needle.  The skin was closed with 4-0 Vicryl in a subcuticular manner.  Dermabond was applied.  Sponge, lap and needle counts correct x 2.  The patient was taken to the recovery room in stable condition.  The vagina was swabbed with  minimal bleeding noted at the perineum which was made hemostatic with suture.   All instrument and needle counts were correct x  3.   The patient was transferred to the recovery room in a stable condition.  Donaciano Eva, MD

## 2018-12-17 NOTE — Anesthesia Postprocedure Evaluation (Signed)
Anesthesia Post Note  Patient: Elizabeth [name]  Procedure(s) Performed: XI ROBOTIC ASSISTED TOTAL HYSTERECTOMY WITH BILATERAL SALPINGO OOPHORECTOMY (Bilateral ) SENTINEL NODE BIOPSY (N/A )     Patient location during evaluation: PACU Anesthesia Type: General Level of consciousness: awake and alert Pain management: pain level controlled Vital Signs Assessment: post-procedure vital signs reviewed and stable Respiratory status: spontaneous breathing, nonlabored ventilation, respiratory function stable and patient connected to nasal cannula oxygen Cardiovascular status: blood pressure returned to baseline and stable Postop Assessment: no apparent nausea or vomiting Anesthetic complications: no    Last Vitals:  Vitals:   12/17/18 1030 12/17/18 1045  BP: (!) 165/76 (!) 177/77  Pulse: 87 89  Resp: (!) 23 (!) 24  Temp:    SpO2: 100% 100%    Last Pain:  Vitals:   12/17/18 0627  TempSrc: Oral  PainSc:                  OSSEY,KEVIN DAVID

## 2018-12-17 NOTE — Anesthesia Procedure Notes (Signed)
Procedure Name: Intubation Date/Time: 12/17/2018 7:46 AM Performed by: Pilar Grammes, CRNA Pre-anesthesia Checklist: Patient identified, Emergency Drugs available, Suction available, Patient being monitored and Timeout performed Patient Re-evaluated:Patient Re-evaluated prior to induction Oxygen Delivery Method: Circle system utilized Preoxygenation: Pre-oxygenation with 100% oxygen Induction Type: IV induction Ventilation: Mask ventilation without difficulty Laryngoscope Size: 3, Mac and 4 Grade View: Grade II Tube type: Oral Tube size: 7.5 mm Number of attempts: 1 Airway Equipment and Method: Stylet Placement Confirmation: positive ETCO2,  ETT inserted through vocal cords under direct vision,  CO2 detector and breath sounds checked- equal and bilateral Secured at: 21 cm Tube secured with: Tape Dental Injury: Teeth and Oropharynx as per pre-operative assessment

## 2018-12-17 NOTE — Discharge Instructions (Addendum)
12/17/2018  Return to work: 4 weeks  Activity: 1. Be up and out of the bed during the day.  Take a nap if needed.  You may walk up steps but be careful and use the hand rail.  Stair climbing will tire you more than you think, you may need to stop part way and rest.   2. No lifting or straining for 6 weeks.  3. No driving for 1 weeks.  Do Not drive if you are taking narcotic pain medicine.  4. Shower daily.  Use soap and water on your incision and pat dry; don't rub.   5. No sexual activity and nothing in the vagina for 8 weeks.  Medications:  - Take ibuprofen and tylenol first line for pain control. Take these regularly (every 6 hours) to decrease the build up of pain.  - If necessary, for severe pain not relieved by ibuprofen, take percocet.  - While taking percocet you should take sennakot every night to reduce the likelihood of constipation. If this causes diarrhea, stop its use.  Diet: 1. Low sodium Heart Healthy Diet is recommended.  2. It is safe to use a laxative if you have difficulty moving your bowels.   Wound Care: 1. Keep clean and dry.  Shower daily. 2. You can get the dressing wet in the shower, but avoid tub baths. 3. Remove the dressing 24 hours after your surgery (1 day after your operation). 4. After the dressing is removed you can get the incision wet in the shower, however continue to avoid tub baths until advised otherwise by your surgeon at follow-up.   Reasons to call the Doctor:   Fever - Oral temperature greater than 100.4 degrees Fahrenheit  Foul-smelling vaginal discharge  Difficulty urinating  Nausea and vomiting  Increased pain at the site of the incision that is unrelieved with pain medicine.  Difficulty breathing with or without chest pain  New calf pain especially if only on one side  Sudden, continuing increased vaginal bleeding with or without clots.   After Your Surgery  The information in this section will tell you what to  expect after your surgery, both during your stay and after you leave. You will learn how to safely recover from your surgery. Write down any questions you have and be sure to ask your doctor or nurse. What to Expect When you wake up after your surgery, you will be in the [facility] (PACU) or your recovery room. A nurse will be monitoring your body temperature, blood pressure, pulse, and oxygen levels. You may have a urinary catheter in your bladder to help monitor the amount of urine you are making. It should come out before you go home. You will also have compression boots on your lower legs to help your circulation. Your pain medication will be given through an IV line or in tablet form. If you are having pain, tell your nurse. Your nurse will tell you how to recover from your surgery. Below are examples of ways you can help yourself recover safely.  You will be encouraged to walk with the help of your nurse or physical therapist. We will give you medication to relieve pain. Walking helps reduce the risk for blood clots and pneumonia. It also helps to stimulate your bowels so they begin working again.  Use your incentive spirometer. This will help your lungs expand, which prevents pneumonia. For more information, read How to Use Your Incentive Spirometer. Commonly Asked Questions Will I have  pain after surgery? Yes, you will have some pain after your surgery, especially in the first few days. Your doctor and nurse will ask you about your pain often. You will be given medication to manage your pain as needed. If your pain is not relieved, please tell your doctor or nurse. It is important to control your pain so you can cough, breathe deeply, use your incentive spirometer, and get out of bed and walk. Will I be able to eat? Yes, you will be able to eat a regular diet or eat as tolerated. You should start with foods that are soft and easy to digest such as apple sauce and chicken noodle  soup. Eat small meals frequently, and then advance to regular foods. If you experience bloating, gas, or cramps, limit high-fiber foods, including whole grain breads and cereal, nuts, seeds, salads, fresh fruit, broccoli, cabbage, and cauliflower. Will I have pain when I am home? The length of time each person has pain or discomfort varies. You may still have some pain when you go home and will probably be taking pain medication. Follow the guidelines below.  Take your medications as directed and as needed.  Call your doctor if the medication prescribed for you doesn't relieve your pain.  Don't drive or drink alcohol while you're taking prescription pain medication.  As your incision heals, you will have less pain and need less pain medication. A mild pain reliever such as acetaminophen (Tylenol) or ibuprofen (Advil) will relieve aches and discomfort. However, large quantities of acetaminophen may be harmful to your liver. Don't take more acetaminophen than the amount directed on the bottle or as instructed by your doctor or nurse.  Pain medication should help you as you resume your normal activities. Take enough medication to do your exercises comfortably. Pain medication is most effective 30 to 45 minutes after taking it.  Keep track of when you take your pain medication. Taking it when your pain first begins is more effective than waiting for the pain to get worse. Pain medication may cause constipation (having fewer bowel movements than what is normal for you). How can I prevent constipation?  Go to the bathroom at the same time every day. Your body will get used to going at that time.  If you feel the urge to go, don't put it off. Try to use the bathroom 5 to 15 minutes after meals.  After breakfast is a good time to move your bowels. The reflexes in your colon are strongest at this time.  Exercise, if you can. Walking is an excellent form of exercise.  Drink 8 (8-ounce) glasses (2  liters) of liquids daily, if you can. Drink water, juices, soups, ice cream shakes, and other drinks that don't have caffeine. Drinks with caffeine, such as coffee and soda, pull fluid out of the body.  Slowly increase the fiber in your diet to 25 to 35 grams per day. Fruits, vegetables, whole grains, and cereals contain fiber. If you have an ostomy or have had recent bowel surgery, check with your doctor or nurse before making any changes in your diet.  Both over-the-counter and prescription medications are available to treat constipation. Start with 1 of the following over-the-counter medications first: o Docusate sodium (Colace) 100 mg. Take ___1__ capsules _2____ times a day. This is a stool softener that causes few side effects. Don't take it with mineral oil. o Polyethylene glycol (MiraLAX) 17 grams daily. o Senna (Senokot) 2 tablets at bedtime. This is a  stimulant laxative, which can cause cramping.  If you haven't had a bowel movement in 2 days, call your doctor or nurse. Can I shower? Yes, you should shower 24 hours after your surgery. Be sure to shower every day. Taking a warm shower is relaxing and can help decrease muscle aches. Use soap when you shower and gently wash your incision. Pat the areas dry with a towel after showering, and leave your incision uncovered (unless there is drainage). Call your doctor if you see any redness or drainage from your incision. Don't take tub baths until you discuss it with your doctor at the first appointment after your surgery. How do I care for my incisions? You will have several small incisions on your abdomen. The incisions are closed with Steri-Strips or Dermabond. You may also have square white dressings on your incisions (Primapore). You can remove these in the shower 24 hours after your surgery. You should clean your incisions with soap and water. If you go home with Steri-Strips on your incision, they will loosen and may fall off by  themselves. If they haven't fallen off within 10 days, you can remove them. If you go home with Dermabond over your sutures (stitches), it will also loosen and peel off. What are the most common symptoms after a hysterectomy? It's common for you to have some vaginal spotting or light bleeding. You should monitor this with a pad or a panty liner. If you have having heavy bleeding (bleeding through a pad or liner every 1 to 2 hours), call your doctor right away. It's also common to have some discomfort after surgery from the air that was pumped into your abdomen during surgery. To help with this, walk, drink plenty of liquids and make sure to take the stool softeners you received. When is it safe for me to drive? You may resume driving 2 weeks after surgery, as long as you are not taking pain medication that may make you drowsy. When can I resume sexual activity? Do not place anything in your vagina or have vaginal intercourse for 8 weeks after your surgery. Some people will need to wait longer than 8 weeks, so speak with your doctor before resuming sexual intercourse. Will I be able to travel? Yes, you can travel. If you are traveling by plane within a few weeks after your surgery, make sure you get up and walk every hour. Be sure to stretch your legs, drink plenty of liquids, and keep your feet elevated when possible. Will I need any supplies? Most people do not need any supplies after the surgery. In the rare case that you do need supplies, such as tubes or drains, your nurse will order them for you. When can I return to work? The time it takes to return to work depends on the type of work you do, the type of surgery you had, and how fast your body heals. Most people can return to work about 2 to 4 weeks after the surgery. What exercises can I do? Exercise will help you gain strength and feel better. Walking and stair climbing are excellent forms of exercise. Gradually increase the distance you  walk. Climb stairs slowly, resting or stopping as needed. Ask your doctor or nurse before starting more strenuous exercises. When can I lift heavy objects? Most people should not lift anything heavier than 10 pounds (4.5 kilograms) for at least 4 weeks after surgery. Speak with your doctor about when you can do heavy lifting. How can I  cope with my feelings? After surgery for a serious illness, you may have new and upsetting feelings. Many people say they felt weepy, sad, worried, nervous, irritable, and angry at one time or another. You may find that you can't control some of these feelings. If this happens, it's a good idea to seek emotional support. The first step in coping is to talk about how you feel. Family and friends can help. Your nurse, doctor, and social worker can reassure, support, and guide you. It's always a good idea to let these professionals know how you, your family, and your friends are feeling emotionally. Many resources are available to patients and their families. Whether you're in the hospital or at home, the nurses, doctors, and social workers are here to help you and your family and friends handle the emotional aspects of your illness. When is my first appointment after surgery? Your first appointment after surgery will be 2 to 4 weeks after surgery. Your nurse will give you instructions on how to make this appointment, including the phone number to call. What if I have other questions? If you have any questions or concerns, please talk with your doctor or nurse. You can reach them Monday through Friday from 9:00 am to 5:00 pm. After 5:00 pm, during the weekend, and on holidays, call [phone] and ask for the doctor on call for your doctor.   Have a temperature of 101 F (38.3 C) or higher  Have pain that does not get better with pain medication  Have redness, drainage, or swelling from your incisions   Follow-up: 1. See Everitt Amber in 3 weeks. She will call you with  pathology in 1 week.   Contacts: For questions or concerns you should contact:  Dr. Everitt Amber at [phone] After hours and on week-ends call [phone] and ask to speak to the physician on call for Gynecologic Oncology

## 2018-12-17 NOTE — Progress Notes (Signed)
PACU NURSING NOTE: Phase II note, ready for DC to home, spoke with Dr. Denman George, MD regards to having urgency but unable to void, bladder scan performed, order from MD was to allow pt to be DC to home. Pt is alert and oriented, able to follow commands, voices no complaints, pain is soreness at this time. Able to ambulate with walker, vital signs stable, Daughter, Junie Bame, phoned and DC instructions reviewed with her and opportunity for questions provided.

## 2018-12-17 NOTE — Transfer of Care (Signed)
Immediate Anesthesia Transfer of Care Note  Patient: Elizabeth [name]  Procedure(s) Performed: XI ROBOTIC ASSISTED TOTAL HYSTERECTOMY WITH BILATERAL SALPINGO OOPHORECTOMY (Bilateral ) SENTINEL NODE BIOPSY (N/A )  Patient Location: PACU  Anesthesia Type:General  Level of Consciousness: awake, oriented, drowsy and patient cooperative  Airway & Oxygen Therapy: Patient Spontanous Breathing and Patient connected to face mask oxygen  Post-op Assessment: Report given to RN and Post -op Vital signs reviewed and stable  Post vital signs: stable  Last Vitals:  Vitals Value Taken Time  BP 177/79 12/17/18 1025  Temp    Pulse 87 12/17/18 1027  Resp    SpO2 100 % 12/17/18 1027  Vitals shown include unvalidated device data.  Last Pain:  Vitals:   12/17/18 0627  TempSrc: Oral  PainSc:       Patients Stated Pain Goal: 3 ([id number])  Complications: No apparent anesthesia complications

## 2018-12-18 ENCOUNTER — Telehealth: Payer: Self-pay

## 2018-12-18 ENCOUNTER — Encounter (HOSPITAL_COMMUNITY): Payer: Self-pay | Admitting: Gynecologic Oncology

## 2018-12-18 NOTE — Telephone Encounter (Signed)
Spoke with daughter Junie Bame. Her mother is doing well today.She is eating, drinking, and urinating well. She is passing gas. She has taken 1 ibuprofen 200 mg tablet for discomfort. She has not needed tramadol. Pt up and moving around a fair amount today. Daughter aware of follow up appointments and knows to call the office at [phone] if there are any questions or concerns.

## 2018-12-19 ENCOUNTER — Telehealth: Payer: Self-pay | Admitting: *Deleted

## 2018-12-19 NOTE — Telephone Encounter (Signed)
Called and spoke with the patient's daughter, move the appt on 11/4 to earlier in the day. Patient's daughter stated that "I ran some errands yesterday and when I got back my mom told me she had been out walking. She went out the door and down the stairs, then walked a little. [place]. Today she is in pain, she is taking her pain meds, if she doesn't feeling better should I can back." Explained to call back if she doesn't feel better per [facility] APP

## 2018-12-20 LAB — SURGICAL PATHOLOGY

## 2018-12-24 ENCOUNTER — Other Ambulatory Visit: Payer: Self-pay

## 2018-12-24 ENCOUNTER — Inpatient Hospital Stay: Payer: Medicare Other | Attending: Adult Health | Admitting: Gynecologic Oncology

## 2018-12-24 DIAGNOSIS — C541 Malignant neoplasm of endometrium: Secondary | ICD-10-CM

## 2018-12-24 DIAGNOSIS — Z90722 Acquired absence of ovaries, bilateral: Secondary | ICD-10-CM

## 2018-12-24 DIAGNOSIS — C55 Malignant neoplasm of uterus, part unspecified: Secondary | ICD-10-CM

## 2018-12-24 DIAGNOSIS — Z7189 Other specified counseling: Secondary | ICD-10-CM

## 2018-12-24 DIAGNOSIS — Z90710 Acquired absence of both cervix and uterus: Secondary | ICD-10-CM

## 2018-12-24 NOTE — Progress Notes (Signed)
Gynecologic Oncology Telehealth Consult Note: Gyn-Onc  I connected with [name] on 12/24/18 at  3:30 PM EDT by telephone and verified that I am speaking with the correct person using two identifiers.  I discussed the limitations, risks, security and privacy concerns of performing an evaluation and management service by telemedicine and the availability of in-person appointments. I also discussed with the patient that there may be a patient responsible charge related to this service. The patient expressed understanding and agreed to proceed.  Other persons participating in the visit and their role in the encounter: none.  Patient's location: home Provider's location: [place]  Chief Complaint:  Chief Complaint  Patient presents with  . Endometrial Cancer    follow up post-op phone visit    Assessment/Plan:  Elizabeth [name]  is a 73 y.o.  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 MSI pending Pathology revealed low risk factors for recurrence, therefore no adjuvant therapy is recommended according to NCCN guidelines.  I discussed risk for recurrence and typical symptoms encouraged her to notify us of these should they develop between visits.  I recommend she have follow-up every 6 months for 5 years in accordance with NCCN guidelines. Those visits should include symptom assessment, physical exam and pelvic examination. Pap smears are not indicated or recommended in the routine surveillance of endometrial cancer.   HPI: Elizabeth [name] is a 73 year old parous woman who was seen in consultation at the request of Dr. Lannette Donath for FIGO grade 1 endometrioid adenocarcinoma in the setting of morbid obesity, schizophrenia, and history of congestive heart failure.  The patient reports abnormal postmenopausal bleeding since July 2020.  This prompted her to be seen by Dr.  Ulanda Edison who performed a Pap which was negative for cytologic abnormalities.  He also performed an endometrial biopsy on November 06, 2018 which revealed FIGO grade 1 endometrioid adenocarcinoma arising in the background of complex atypical hyperplasia.  Her history is significant for morbid obesity with a BMI of 44 kg/m.  She has schizophrenia but lives alone.  Her daughter lives in town.  She has a history of left stage IIa breast cancer with positive axillary node (microscopic) ER PR positive HER-2 negative.  This was diagnosed and treated in 2011.  She was recommended to receive adjuvant chemotherapy but declined.  Instead she received mastectomy, and letrozole therapy postoperatively.  She is remained NED since that time.  Her medical history is also significant for history of congestive heart heart failure approximately 10 years ago.  She reports that Dr. Ezequiel Ganser is her cardiologist but has not seen him for many years and has not had a echocardiogram for many years.  She also has a history of hypertension.  She reports some shortness of breath on exertion but no chest pain.  She has a history of hypercholesterolemia.  She denies antiplatelet or anticoagulant use.  A transvaginal ultrasound had been performed on September 13, 2018 and revealed a uterus measuring 11.6 x 4.1 x 5.7 cm.  The endometrial thickness measured 18 mm.  There is a myometrial mass measuring a centimeter in the fundus likely a fibroid.  The right and left ovaries were grossly normal.  There was no free fluid.  Interval Hx:   On December 17, 2018 she underwent a robotic assisted total hysterectomy with BSO and sentinel lymph node biopsy.  Intraoperative findings were significant for a 12 cm  fibroid uterus, significant retroperitoneal and intra-abdominal adiposity.  No suspicious extrauterine disease.  Final pathology returned as a FIGO grade 1 endometrioid adenocarcinoma with inner half myometrial invasion (10 of 22 mm).  The cervical  stroma, adnexa, lymph nodes, and adnexa were negative for metastatic disease.  There was no lymphovascular space invasion.  She met low risk criteria for recurrence and therefore no adjuvant therapy was recommended in accordance with NCCN guidelines.  Current Meds:  Outpatient Encounter Medications as of 12/24/2018  Medication Sig  . ARIPiprazole (ABILIFY) 15 MG tablet Take 15 mg by mouth at bedtime.   . benztropine (COGENTIN) 0.5 MG tablet Take 0.5 mg by mouth daily.   . Cholecalciferol (VITAMIN D) 125 MCG (5000 UT) CAPS Take 5,000 Units by mouth daily.  . CRESTOR 20 MG tablet Take 20 mg by mouth daily.   Marland Kitchen escitalopram (LEXAPRO) 20 MG tablet Take 20 mg by mouth at bedtime.   . fluticasone (FLONASE) 50 MCG/ACT nasal spray Place 2 sprays into both nostrils daily as needed for allergies or rhinitis.  . furosemide (LASIX) 40 MG tablet Take 1 tablet (40 mg total) by mouth daily.  . hydrocortisone cream 1 % Apply 1 application topically daily as needed for itching.  Marland Kitchen lisinopril (ZESTRIL) 10 MG tablet Take 10 mg by mouth daily.   Marland Kitchen senna-docusate (SENOKOT-S) 8.6-50 MG tablet Take 2 tablets by mouth at bedtime. For AFTER surgery, do not take if having diarrhea  . spironolactone (ALDACTONE) 25 MG tablet Take 25 mg by mouth daily.  . traMADol (ULTRAM) 50 MG tablet Take 1 tablet (50 mg total) by mouth every 6 (six) hours as needed for severe pain. For AFTER surgery, only do not take and drive  . VENTOLIN HFA 108 (90 Base) MCG/ACT inhaler Inhale 2 puffs into the lungs every 6 (six) hours as needed for wheezing or shortness of breath.    No facility-administered encounter medications on file as of 12/24/2018.     Allergy: No Known Allergies  Social Hx:   Social History   Socioeconomic History  . Marital status: Divorced    Spouse name: Not on file  . Number of children: Not on file  . Years of education: Not on file  . Highest education level: Not on file  Occupational History  . Not on  file  Social Needs  . Financial resource strain: Not on file  . Food insecurity    Worry: Not on file    Inability: Not on file  . Transportation needs    Medical: Not on file    Non-medical: Not on file  Tobacco Use  . Smoking status: Never Smoker  . Smokeless tobacco: Never Used  Substance and Sexual Activity  . Alcohol use: No  . Drug use: No  . Sexual activity: Not Currently  Lifestyle  . Physical activity    Days per week: Not on file    Minutes per session: Not on file  . Stress: Not on file  Relationships  . Social Herbalist on phone: Not on file    Gets together: Not on file    Attends religious service: Not on file    Active member of club or organization: Not on file    Attends meetings of clubs or organizations: Not on file    Relationship status: Not on file  . Intimate partner violence    Fear of current or ex partner: Not on file    Emotionally abused: Not on  file    Physically abused: Not on file    Forced sexual activity: Not on file  Other Topics Concern  . Not on file  Social History Narrative  . Not on file    Past Surgical Hx:  Past Surgical History:  Procedure Laterality Date  . BREAST SURGERY    . MASTECTOMY Left   . ROBOTIC ASSISTED TOTAL HYSTERECTOMY WITH BILATERAL SALPINGO OOPHERECTOMY Bilateral 12/17/2018   Procedure: XI ROBOTIC ASSISTED TOTAL HYSTERECTOMY WITH BILATERAL SALPINGO OOPHORECTOMY;  Surgeon: Everitt Amber, MD;  Location: [facility];  Service: Gynecology;  Laterality: Bilateral;  . SENTINEL NODE BIOPSY N/A 12/17/2018   Procedure: SENTINEL NODE BIOPSY;  Surgeon: Everitt Amber, MD;  Location: [facility];  Service: Gynecology;  Laterality: N/A;  . TONSILLECTOMY      Past Medical Hx:  Past Medical History:  Diagnosis Date  . Anxiety   . Breast CA ([place])    (Lt) breast ca dx 02/2010  . Breast cancer ([place]) 03/31/2011  . Depression   . GERD (gastroesophageal reflux disease) 12/17/2012  . Hypertension   . Hypertension 12/17/2012   . Nausea alone 12/17/2012  . Schizophrenia [facility])     Past Gynecological History:   No LMP recorded. Patient is postmenopausal.  Family Hx:  Family History  Problem Relation Age of Onset  . Lung cancer Mother   . Lung cancer Father   . Heart attack Sister   . Heart attack Brother     Review of Systems:  Constitutional  Feels well,   ENT Normal appearing ears and nares bilaterally Skin/Breast  No rash, sores, jaundice, itching, dryness Cardiovascular  No chest pain, shortness of breath, or edema  Pulmonary  No cough or wheeze.  Gastro Intestinal  No nausea, vomitting, or diarrhoea. No bright red blood per rectum, no abdominal pain, change in bowel movement, or constipation.  Genito Urinary  No frequency, urgency, dysuria, no bleeding  Musculo Skeletal  No myalgia, arthralgia, joint swelling or pain  Neurologic  No weakness, numbness, change in gait,  Psychology  No depression, anxiety, insomnia. + schizophrenia  Vitals:  There were no vitals taken for this visit.  Physical Exam: Deferred  I discussed the assessment and treatment plan with the patient. The patient was provided with an opportunity to ask questions and all were answered. The patient agreed with the plan and demonstrated an understanding of the instructions.   The patient was advised to call back or see an in-person evaluation if the symptoms worsen or if the condition fails to improve as anticipated.   I provided 10 minutes of non face-to-face telephone visit time during this encounter, and > 50% was spent counseling as documented under my assessment & plan.    Thereasa Solo, MD  12/24/2018, 5:08 PM

## 2019-01-08 ENCOUNTER — Other Ambulatory Visit: Payer: Self-pay

## 2019-01-08 ENCOUNTER — Inpatient Hospital Stay: Payer: Medicare Other | Attending: Adult Health | Admitting: Gynecologic Oncology

## 2019-01-08 ENCOUNTER — Encounter: Payer: Self-pay | Admitting: Gynecologic Oncology

## 2019-01-08 VITALS — BP 141/46 | HR 69 | Temp 99.1°F | Resp 18 | Ht 65.0 in | Wt 224.0 lb

## 2019-01-08 DIAGNOSIS — Z7189 Other specified counseling: Secondary | ICD-10-CM

## 2019-01-08 DIAGNOSIS — C541 Malignant neoplasm of endometrium: Secondary | ICD-10-CM | POA: Diagnosis not present

## 2019-01-08 DIAGNOSIS — Z6841 Body Mass Index (BMI) 40.0 and over, adult: Secondary | ICD-10-CM | POA: Diagnosis not present

## 2019-01-08 DIAGNOSIS — Z90722 Acquired absence of ovaries, bilateral: Secondary | ICD-10-CM | POA: Diagnosis not present

## 2019-01-08 DIAGNOSIS — Z90710 Acquired absence of both cervix and uterus: Secondary | ICD-10-CM | POA: Diagnosis not present

## 2019-01-08 DIAGNOSIS — C55 Malignant neoplasm of uterus, part unspecified: Secondary | ICD-10-CM

## 2019-01-08 NOTE — Patient Instructions (Signed)
Please notify Dr Denman George at phone number [phone] if you notice vaginal bleeding, new pelvic or abdominal pains, bloating, feeling full easy, or a change in bladder or bowel function.   Dr Denman George will see you back in 6 months for a pelvic examination.

## 2019-01-08 NOTE — Progress Notes (Signed)
Gynecologic Oncology Follow-up  Chief Complaint:  Chief Complaint  Patient presents with  . Endometrioid adenocarcinoma of uterus [facility])    Assessment/Plan:  [name]  is a 73 y.o.  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 MSI pending Pathology revealed low risk factors for recurrence, therefore no adjuvant therapy is recommended according to NCCN guidelines.  I discussed risk for recurrence and typical symptoms encouraged her to notify us of these should they develop between visits.  I recommend she have follow-up every 6 months for 5 years in accordance with NCCN guidelines. Those visits should include symptom assessment, physical exam and pelvic examination. Pap smears are not indicated or recommended in the routine surveillance of endometrial cancer.    HPI: [name] is a 73 year old parous woman who was seen in consultation at the request of Dr. Lannette [name] for FIGO grade 1 endometrioid adenocarcinoma in the setting of morbid obesity, schizophrenia, and history of congestive heart failure.  The patient reports abnormal postmenopausal bleeding since July 2020.  This prompted her to be seen by Dr. Ulanda [name] who performed a Pap which was negative for cytologic abnormalities.  He also performed an endometrial biopsy on November 06, 2018 which revealed FIGO grade 1 endometrioid adenocarcinoma arising in the background of complex atypical hyperplasia.  Her history is significant for morbid obesity with a BMI of 44 kg/m.  She has schizophrenia but lives alone.  Her daughter lives in town.  She has a history of left stage IIa breast cancer with positive axillary node (microscopic) ER PR positive HER-2 negative.  This was diagnosed and treated in 2011.  She was recommended to receive adjuvant chemotherapy but declined.  Instead she received mastectomy, and letrozole therapy postoperatively.   She is remained NED since that time.  Her medical history is also significant for history of congestive heart heart failure approximately 10 years ago.  She reports that Dr. Ezequiel [name] is her cardiologist but has not seen him for many years and has not had a echocardiogram for many years.  She also has a history of hypertension.  She reports some shortness of breath on exertion but no chest pain.  She has a history of hypercholesterolemia.  She denies antiplatelet or anticoagulant use.  A transvaginal ultrasound had been performed on September 13, 2018 and revealed a uterus measuring 11.6 x 4.1 x 5.7 cm.  The endometrial thickness measured 18 mm.  There is a myometrial mass measuring a centimeter in the fundus likely a fibroid.  The right and left ovaries were grossly normal.  There was no free fluid.  Interval Hx:   On December 17, 2018 she underwent a robotic assisted total hysterectomy with BSO and sentinel lymph node biopsy.  Intraoperative findings were significant for a 12 cm fibroid uterus, significant retroperitoneal and intra-abdominal adiposity.  No suspicious extrauterine disease.  Final pathology returned as a FIGO grade 1 endometrioid adenocarcinoma with inner half myometrial invasion (10 of 22 mm).  The cervical stroma, adnexa, lymph nodes, and adnexa were negative for metastatic disease.  There was no lymphovascular space invasion.  She met low risk criteria for recurrence and therefore no adjuvant therapy was recommended in accordance with NCCN guidelines.  Current Meds:  Outpatient Encounter Medications as of 01/08/2019  Medication Sig  . ARIPiprazole (ABILIFY) 15 MG tablet Take 15 mg by mouth at bedtime.   . benztropine (COGENTIN) 0.5 MG tablet Take 0.5 mg  by mouth daily.   . Cholecalciferol (VITAMIN D) 125 MCG (5000 UT) CAPS Take 5,000 Units by mouth daily.  . CRESTOR 20 MG tablet Take 20 mg by mouth daily.   Marland Kitchen escitalopram (LEXAPRO) 20 MG tablet Take 20 mg by mouth at bedtime.   .  fluticasone (FLONASE) 50 MCG/ACT nasal spray Place 2 sprays into both nostrils daily as needed for allergies or rhinitis.  . furosemide (LASIX) 40 MG tablet Take 1 tablet (40 mg total) by mouth daily.  . hydrocortisone cream 1 % Apply 1 application topically daily as needed for itching.  Marland Kitchen lisinopril (ZESTRIL) 10 MG tablet Take 10 mg by mouth daily.   Marland Kitchen senna-docusate (SENOKOT-S) 8.6-50 MG tablet Take 2 tablets by mouth at bedtime. For AFTER surgery, do not take if having diarrhea  . spironolactone (ALDACTONE) 25 MG tablet Take 25 mg by mouth daily.  . traMADol (ULTRAM) 50 MG tablet Take 1 tablet (50 mg total) by mouth every 6 (six) hours as needed for severe pain. For AFTER surgery, only do not take and drive  . VENTOLIN HFA 108 (90 Base) MCG/ACT inhaler Inhale 2 puffs into the lungs every 6 (six) hours as needed for wheezing or shortness of breath.    No facility-administered encounter medications on file as of 01/08/2019.     Allergy: No Known Allergies  Social Hx:   Social History   Socioeconomic History  . Marital status: Divorced    Spouse name: Not on file  . Number of children: Not on file  . Years of education: Not on file  . Highest education level: Not on file  Occupational History  . Not on file  Social Needs  . Financial resource strain: Not on file  . Food insecurity    Worry: Not on file    Inability: Not on file  . Transportation needs    Medical: Not on file    Non-medical: Not on file  Tobacco Use  . Smoking status: Never Smoker  . Smokeless tobacco: Never Used  Substance and Sexual Activity  . Alcohol use: No  . Drug use: No  . Sexual activity: Not Currently  Lifestyle  . Physical activity    Days per week: Not on file    Minutes per session: Not on file  . Stress: Not on file  Relationships  . Social Herbalist on phone: Not on file    Gets together: Not on file    Attends religious service: Not on file    Active member of club or  organization: Not on file    Attends meetings of clubs or organizations: Not on file    Relationship status: Not on file  . Intimate partner violence    Fear of current or ex partner: Not on file    Emotionally abused: Not on file    Physically abused: Not on file    Forced sexual activity: Not on file  Other Topics Concern  . Not on file  Social History Narrative  . Not on file    Past Surgical Hx:  Past Surgical History:  Procedure Laterality Date  . BREAST SURGERY    . MASTECTOMY Left   . ROBOTIC ASSISTED TOTAL HYSTERECTOMY WITH BILATERAL SALPINGO OOPHERECTOMY Bilateral 12/17/2018   Procedure: XI ROBOTIC ASSISTED TOTAL HYSTERECTOMY WITH BILATERAL SALPINGO OOPHORECTOMY;  Surgeon: Everitt Amber, MD;  Location: [facility];  Service: Gynecology;  Laterality: Bilateral;  . SENTINEL NODE BIOPSY N/A 12/17/2018   Procedure: [employer]  NODE BIOPSY;  Surgeon: Everitt Amber, MD;  Location: [facility];  Service: Gynecology;  Laterality: N/A;  . TONSILLECTOMY      Past Medical Hx:  Past Medical History:  Diagnosis Date  . Anxiety   . Breast CA ([place])    (Lt) breast ca dx 02/2010  . Breast cancer ([place]) 03/31/2011  . Depression   . GERD (gastroesophageal reflux disease) 12/17/2012  . Hypertension   . Hypertension 12/17/2012  . Nausea alone 12/17/2012  . Schizophrenia [facility])     Past Gynecological History:   No LMP recorded. Patient is postmenopausal.  Family Hx:  Family History  Problem Relation Age of Onset  . Lung cancer Mother   . Lung cancer Father   . Heart attack Sister   . Heart attack Brother     Review of Systems:  Constitutional  Feels well,   ENT Normal appearing ears and nares bilaterally Skin/Breast  No rash, sores, jaundice, itching, dryness Cardiovascular  No chest pain, shortness of breath, or edema  Pulmonary  No cough or wheeze.  Gastro Intestinal  No nausea, vomitting, or diarrhoea. No bright red blood per rectum, no abdominal pain, change in bowel movement, or  constipation.  Genito Urinary  No frequency, urgency, dysuria, no bleeding  Musculo Skeletal  No myalgia, arthralgia, joint swelling or pain  Neurologic  No weakness, numbness, change in gait,  Psychology  No depression, anxiety, insomnia. + schizophrenia  Vitals:  Blood pressure (!) 141/46, pulse 69, temperature 99.1 F (37.3 C), temperature source Temporal, resp. rate 18, height _0  (1.651 m), weight 224 lb (101.6 kg), SpO2 100 %.  Physical Exam: WD in NAD Neck  Supple NROM, without any enlargements.  Lymph Node Survey No cervical supraclavicular or inguinal adenopathy Cardiovascular  Pulse normal rate, regularity and rhythm. S1 and S2 normal.  Lungs  Clear to auscultation bilateraly, without wheezes/crackles/rhonchi. Good air movement.  Skin  No rash/lesions/breakdown  Psychiatry  Alert and oriented to person, place, and time  Abdomen  Normoactive bowel sounds, abdomen soft, non-tender and obese without evidence of hernia. Well healed incisiosn Back No CVA tenderness Genito Urinary  Vulva/vagina: Normal external female genitalia.  No lesions. No discharge or bleeding.  Bladder/urethra:  No lesions or masses, well supported bladder  Vagina: well healed vaginal cuff, no bleeding or masses or lesions.  Rectal  deferred Extremities  No bilateral cyanosis, clubbing or edema.   30 minutes of direct face to face counseling time was spent with the patient. This included discussion about prognosis, therapy recommendations and postoperative side effects and are beyond the scope of routine postoperative care.   Thereasa Solo, MD  01/08/2019, 6:22 PM

## 2019-01-09 ENCOUNTER — Telehealth: Payer: Self-pay | Admitting: *Deleted

## 2019-01-09 NOTE — Telephone Encounter (Signed)
Per Dr. Denman George, office note 11/4 faxed and routed to Dr. Ulanda Edison

## 2019-02-13 ENCOUNTER — Other Ambulatory Visit: Payer: Self-pay

## 2019-02-13 ENCOUNTER — Ambulatory Visit (INDEPENDENT_AMBULATORY_CARE_PROVIDER_SITE_OTHER): Payer: Medicare Other

## 2019-02-13 DIAGNOSIS — I272 Pulmonary hypertension, unspecified: Secondary | ICD-10-CM | POA: Diagnosis not present

## 2019-02-14 ENCOUNTER — Other Ambulatory Visit: Payer: Medicare Other

## 2019-02-24 ENCOUNTER — Other Ambulatory Visit: Payer: Medicare Other

## 2019-03-03 ENCOUNTER — Other Ambulatory Visit: Payer: Self-pay | Admitting: Gynecologic Oncology

## 2019-03-03 DIAGNOSIS — C55 Malignant neoplasm of uterus, part unspecified: Secondary | ICD-10-CM

## 2019-03-05 ENCOUNTER — Ambulatory Visit: Payer: Medicare Other | Admitting: Cardiology

## 2019-03-11 ENCOUNTER — Ambulatory Visit: Payer: Medicare Other | Admitting: Cardiology

## 2019-03-11 ENCOUNTER — Other Ambulatory Visit: Payer: Self-pay

## 2019-03-11 ENCOUNTER — Encounter: Payer: Self-pay | Admitting: Cardiology

## 2019-03-11 ENCOUNTER — Ambulatory Visit (INDEPENDENT_AMBULATORY_CARE_PROVIDER_SITE_OTHER): Payer: Medicare Other | Admitting: Cardiology

## 2019-03-11 VITALS — BP 146/56 | HR 71 | Temp 97.9°F | Ht 65.0 in | Wt 228.6 lb

## 2019-03-11 DIAGNOSIS — I272 Pulmonary hypertension, unspecified: Secondary | ICD-10-CM | POA: Diagnosis not present

## 2019-03-11 DIAGNOSIS — I10 Essential (primary) hypertension: Secondary | ICD-10-CM

## 2019-03-11 DIAGNOSIS — Z6841 Body Mass Index (BMI) 40.0 and over, adult: Secondary | ICD-10-CM | POA: Diagnosis not present

## 2019-03-11 MED ORDER — AMLODIPINE BESYLATE 5 MG PO TABS: 5.0000 mg | ORAL_TABLET | Freq: Every day | ORAL | 2 refills | Status: DC

## 2019-03-11 NOTE — Progress Notes (Signed)
Primary Physician:  Benito Mccreedy, MD   Patient ID: Elizabeth [name], female    DOB: 17-Dec-1945, 74 y.o.   MRN: [id number]  Subjective:    Chief Complaint  Patient presents with  . Pulmonary Hypertension    echo results   . Follow-up  . Congestive Heart Failure    HPI: Elizabeth [name]  is a 74 y.o. female  with history of left breast cancer in 2011 s/p mastectomy and is now in remission, hypertension, prediabetes, recently found to have uterine adenocarcinoma in which she underwent hysterectomy on 12/17/18  She has recently had echocardiogram in Sept 2020, showing normal LVEF, but elevated PA pressures at 54 mmHg.  She does have dyspnea on exertion with walking long distances. No chest pain. She does have to stop and rest due to her breathing. Symptoms started 2-3 months ago. States she has had some improvement with starting inhaler. No PND or orthopnea. No leg swelling. No syncope.  Denies any former tobacco use. States that her brother and sister both have CHF and have had stents placed, but she is unsure of all the details.   Past Medical History:  Diagnosis Date  . Anxiety   . Breast CA ([place])    (Lt) breast ca dx 02/2010  . Breast cancer ([place]) 03/31/2011  . Depression   . GERD (gastroesophageal reflux disease) 12/17/2012  . Hypertension   . Hypertension 12/17/2012  . Nausea alone 12/17/2012  . Schizophrenia [facility])     Past Surgical History:  Procedure Laterality Date  . BREAST SURGERY    . HYSTEROTOMY  12/2018  . MASTECTOMY Left   . ROBOTIC ASSISTED TOTAL HYSTERECTOMY WITH BILATERAL SALPINGO OOPHERECTOMY Bilateral 12/17/2018   Procedure: XI ROBOTIC ASSISTED TOTAL HYSTERECTOMY WITH BILATERAL SALPINGO OOPHORECTOMY;  Surgeon: Everitt Amber, MD;  [facility];  Service: Gynecology;  Laterality: Bilateral;  . SENTINEL NODE BIOPSY N/A 12/17/2018   Procedure: SENTINEL NODE BIOPSY;  Surgeon: Everitt Amber, MD;  Location: [facility];  Service: Gynecology;  Laterality:  N/A;  . TONSILLECTOMY      Social History   Socioeconomic History  . Marital status: Divorced    Spouse name: Not on file  . Number of children: 2  . Years of education: Not on file  . Highest education level: Not on file  Occupational History  . Not on file  Tobacco Use  . Smoking status: Never Smoker  . Smokeless tobacco: Never Used  Substance and Sexual Activity  . Alcohol use: No  . Drug use: No  . Sexual activity: Not Currently  Other Topics Concern  . Not on file  Social History Narrative  . Not on file   Social Determinants of Health   Financial Resource Strain:   . Difficulty of Paying Living Expenses: Not on file  Food Insecurity:   . Worried About Charity fundraiser in the Last Year: Not on file  . Ran Out of Food in the Last Year: Not on file  Transportation Needs:   . Lack of Transportation (Medical): Not on file  . Lack of Transportation (Non-Medical): Not on file  Physical Activity:   . Days of Exercise per Week: Not on file  . Minutes of Exercise per Session: Not on file  Stress:   . Feeling of Stress : Not on file  Social Connections:   . Frequency of Communication with Friends and Family: Not on file  . Frequency of Social Gatherings with Friends and Family: Not on file  .  Attends Religious Services: Not on file  . Active Member of Clubs or Organizations: Not on file  . Attends Archivist Meetings: Not on file  . Marital Status: Not on file  Intimate Partner Violence:   . Fear of Current or Ex-Partner: Not on file  . Emotionally Abused: Not on file  . Physically Abused: Not on file  . Sexually Abused: Not on file    Review of Systems  Constitution: Negative for decreased appetite, malaise/fatigue, weight gain and weight loss.  Eyes: Negative for visual disturbance.  Cardiovascular: Positive for dyspnea on exertion. Negative for chest pain, claudication, leg swelling, orthopnea, palpitations and syncope.  Respiratory: Negative for  hemoptysis and wheezing.   Endocrine: Negative for cold intolerance and heat intolerance.  Hematologic/Lymphatic: Does not bruise/bleed easily.  Skin: Negative for nail changes.  Musculoskeletal: Negative for muscle weakness and myalgias.  Gastrointestinal: Negative for abdominal pain, change in bowel habit, nausea and vomiting.  Neurological: Negative for difficulty with concentration, dizziness, focal weakness and headaches.  Psychiatric/Behavioral: Negative for altered mental status and suicidal ideas.  All other systems reviewed and are negative.     Objective:  Blood pressure (!) 146/56, pulse 71, temperature 97.9 F (36.6 C), height 5\' 5"  (1.651 m), weight 228 lb 9.6 oz (103.7 kg), SpO2 96 %. Body mass index is 38.04 kg/m.     SIX MIN WALK 12/04/2018  Supplimental Oxygen during Test? (L/min) No  Laps 19  Partial Lap (in Meters) 370.6  Baseline Heartrate 80  Baseline SPO2 97  Heartrate 119  SPO2 99  Heartrate 106  SPO2 98  Stopped or Paused before Six Minutes No  Distance Completed 1016.6                Physical Exam  Constitutional: She is oriented to person, place, and time. Vital signs are normal. She appears well-developed and well-nourished.  Morbidly obese  HENT:  Head: Normocephalic and atraumatic.  Cardiovascular: Normal rate, regular rhythm, normal heart sounds and intact distal pulses.  Pulmonary/Chest: Effort normal and breath sounds normal. No accessory muscle usage. No respiratory distress.  Abdominal: Soft. Bowel sounds are normal.  Musculoskeletal:        General: Normal range of motion.     Cervical back: Normal range of motion.  Neurological: She is alert and oriented to person, place, and time.  Skin: Skin is warm and dry.  Vitals reviewed.  Radiology: No results found.  Laboratory examination:    CMP Latest Ref Rng & Units 12/11/2018 03/18/2015 01/14/2015  Glucose 70 - 99 mg/dL 98 100 105  BUN 8 - 23 mg/dL 22 17.7 18.8  Creatinine 0.44 -  1.00 mg/dL 1.00 1.1 1.1  Sodium 135 - 145 mmol/L 134(L) 140 139  Potassium 3.5 - 5.1 mmol/L 4.4 4.8 4.4  Chloride 98 - 111 mmol/L 104 - -  CO2 22 - 32 mmol/L 25 26 25   Calcium 8.9 - 10.3 mg/dL 9.4 9.7 9.5  Total Protein 6.5 - 8.1 g/dL 7.4 7.5 6.8  Total Bilirubin 0.3 - 1.2 mg/dL 0.6 0.31 <0.30  Alkaline Phos 38 - 126 U/L 66 90 93  AST 15 - 41 U/L 18 17 16   ALT 0 - 44 U/L 23 17 18    CBC Latest Ref Rng & Units 12/11/2018 03/18/2015 01/14/2015  WBC 4.0 - 10.5 K/uL 7.1 7.3 7.3  Hemoglobin 12.0 - 15.0 g/dL 12.9 11.8 11.7  Hematocrit 36.0 - 46.0 % 40.8 35.9 36.1  Platelets 150 - 400 K/uL 216  215 213   Lipid Panel  No results found for: CHOL, TRIG, HDL, CHOLHDL, VLDL, LDLCALC, LDLDIRECT HEMOGLOBIN A1C No results found for: HGBA1C, MPG TSH No results for input(s): TSH in the last 8760 hours.  PRN Meds:. There are no discontinued medications. Current Meds  Medication Sig  . ARIPiprazole (ABILIFY) 15 MG tablet Take 15 mg by mouth at bedtime.   . benztropine (COGENTIN) 0.5 MG tablet Take 0.5 mg by mouth daily.   . Cholecalciferol (VITAMIN D) 125 MCG (5000 UT) CAPS Take 5,000 Units by mouth daily.  . CRESTOR 20 MG tablet Take 20 mg by mouth daily.   Marland Kitchen escitalopram (LEXAPRO) 20 MG tablet Take 20 mg by mouth at bedtime.   . fluticasone (FLONASE) 50 MCG/ACT nasal spray Place 2 sprays into both nostrils daily as needed for allergies or rhinitis.  . furosemide (LASIX) 40 MG tablet Take 1 tablet (40 mg total) by mouth daily.  . hydrocortisone cream 1 % Apply 1 application topically daily as needed for itching.  Marland Kitchen lisinopril (ZESTRIL) 10 MG tablet Take 10 mg by mouth daily.   . megestrol (MEGACE) 40 MG tablet TAKE ONE TABLET BY MOUTH DAILY  . senna-docusate (SENOKOT-S) 8.6-50 MG tablet Take 2 tablets by mouth at bedtime. For AFTER surgery, do not take if having diarrhea  . spironolactone (ALDACTONE) 25 MG tablet Take 25 mg by mouth daily.  . traMADol (ULTRAM) 50 MG tablet Take 1 tablet (50 mg  total) by mouth every 6 (six) hours as needed for severe pain. For AFTER surgery, only do not take and drive  . VENTOLIN HFA 108 (90 Base) MCG/ACT inhaler Inhale 2 puffs into the lungs every 6 (six) hours as needed for wheezing or shortness of breath.     Cardiac Studies:   Echocardiogram 02/13/2019:  Normal LV systolic function with EF 60%. Left ventricle cavity is normal in size. Mild concentric hypertrophy of the left ventricle. Normal global wall motion. Normal diastolic filling pattern. Calculated EF 60%. Structurally normal mitral valve.  Mild (Grade I) mitral regurgitation. Structurally normal tricuspid valve.  Mild tricuspid regurgitation. Mild to moderate pulmonary hypertension. RVSP measures 41 mmHg. Compared to study done on 11/20/2018, PA pressure was measured at 54 mm Hg.   Assessment:   Pulmonary hypertension ([place]) - Plan: EKG 12-Lead  Primary hypertension  Morbid obesity with BMI of 40.0-44.9, adult ([place])  EKG 03/11/2019: Normal sinus rhythm at 70 bpm, normal axis, no evidence of ischemia.   Recommendations:   She has recovered well from surgery. I have discussed recent echocardiogram findings, which shows some improvement in PA pressures. She has continued dyspnea on exertion that is stable and unchanged. I continue to feel that her pulmonary hypertension is secondary from obesity and potentially sleep apnea. She reports having sleep study a few years ago, but never received results. I will follow up on this, and if needed will place referral. No clinical evidence of decompensated heart failure by exam. If she has worsening dyspnea, will consider further evaluation for primary pulmonary hypertension. Continue with Lasix and Aldactone. Blood pressure is elevated. Will add amlodipine 5 mg daily.   I have encouraged her to continue to work on weight loss with dietary changes. We have extensively discussed dietary changes. [facility] diet sheet was given.  I will see her back in 4  weeks for follow up on hypertension.    Miquel Dunn, MSN, APRN, FNP-C [facility]. [place] Office: [phone] Fax: [phone]

## 2019-03-21 ENCOUNTER — Telehealth: Payer: Self-pay | Admitting: *Deleted

## 2019-03-21 NOTE — Telephone Encounter (Signed)
Patient called to see if she can start to exercise. Per Melissa APP the patient can start to exercise

## 2019-04-08 ENCOUNTER — Ambulatory Visit: Payer: Medicare Other | Admitting: Cardiology

## 2019-04-09 ENCOUNTER — Other Ambulatory Visit: Payer: Self-pay

## 2019-04-09 ENCOUNTER — Encounter: Payer: Self-pay | Admitting: Cardiology

## 2019-04-09 ENCOUNTER — Ambulatory Visit (INDEPENDENT_AMBULATORY_CARE_PROVIDER_SITE_OTHER): Payer: Medicare Other | Admitting: Cardiology

## 2019-04-09 VITALS — BP 146/53 | HR 78 | Temp 98.0°F | Resp 16 | Ht 65.0 in | Wt 232.0 lb

## 2019-04-09 DIAGNOSIS — I10 Essential (primary) hypertension: Secondary | ICD-10-CM | POA: Diagnosis not present

## 2019-04-09 DIAGNOSIS — I272 Pulmonary hypertension, unspecified: Secondary | ICD-10-CM | POA: Diagnosis not present

## 2019-04-09 DIAGNOSIS — R0600 Dyspnea, unspecified: Secondary | ICD-10-CM

## 2019-04-09 DIAGNOSIS — R0609 Other forms of dyspnea: Secondary | ICD-10-CM

## 2019-04-09 DIAGNOSIS — R600 Localized edema: Secondary | ICD-10-CM

## 2019-04-09 DIAGNOSIS — Z6841 Body Mass Index (BMI) 40.0 and over, adult: Secondary | ICD-10-CM | POA: Diagnosis not present

## 2019-04-09 MED ORDER — AMLODIPINE BESYLATE 5 MG PO TABS: 5.0000 mg | ORAL_TABLET | Freq: Every day | ORAL | 2 refills | Status: DC

## 2019-04-09 MED ORDER — VALSARTAN-HYDROCHLOROTHIAZIDE 320-12.5 MG PO TABS: 1.0000 | ORAL_TABLET | Freq: Every day | ORAL | 1 refills | Status: DC

## 2019-04-09 MED ORDER — FUROSEMIDE 40 MG PO TABS: 40.0000 mg | ORAL_TABLET | Freq: Every day | ORAL | 2 refills | Status: DC

## 2019-04-09 NOTE — Progress Notes (Signed)
Primary Physician:  Benito Mccreedy, MD   Patient ID: Elizabeth [name], female    DOB: 12/17/1945, 74 y.o.   MRN: [id number]  Subjective:    Chief Complaint  Patient presents with  . Hypertension    Pulmonary HTN  . Congestive Heart Failure    HPI: Elizabeth [name]  is a 74 y.o. 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 She was found to have elevated PA pressures at 54 mmHg on echo in Sept 2020, recently underwent repeat echo in 02/13/2019 that showed slight improvement in PA pressures to 41 mmHg. At her last office visit, she was started on amlodipine at her last visit for hypertension. She now presents for follow up.  She does have dyspnea on exertion with walking long distances that she feels is unchanged. No chest pain. She does have to stop and rest due to her breathing. States she has had some improvement with starting inhaler. No PND or orthopnea. No leg swelling. No syncope. She has noted leg swelling. Has not been able to lose weight.  Denies any former tobacco use. States that her brother and sister both have CHF and have had stents placed, but she is unsure of all the details.   Past Medical History:  Diagnosis Date  . Anxiety   . Breast CA ([place])    (Lt) breast ca dx 02/2010  . Breast cancer ([place]) 03/31/2011  . Depression   . GERD (gastroesophageal reflux disease) 12/17/2012  . Hypertension   . Hypertension 12/17/2012  . Nausea alone 12/17/2012  . Schizophrenia [facility])     Past Surgical History:  Procedure Laterality Date  . BREAST SURGERY    . HYSTEROTOMY  12/2018  . MASTECTOMY Left   . ROBOTIC ASSISTED TOTAL HYSTERECTOMY WITH BILATERAL SALPINGO OOPHERECTOMY Bilateral 12/17/2018   Procedure: XI ROBOTIC ASSISTED TOTAL HYSTERECTOMY WITH BILATERAL SALPINGO OOPHORECTOMY;  Surgeon: Everitt Amber,  MD;  Location: [facility];  Service: Gynecology;  Laterality: Bilateral;  . SENTINEL NODE BIOPSY N/A 12/17/2018   Procedure: SENTINEL NODE BIOPSY;  Surgeon: Everitt Amber, MD;  Location: [facility];  Service: Gynecology;  Laterality: N/A;  . TONSILLECTOMY      Social History   Socioeconomic History  . Marital status: Divorced    Spouse name: Not on file  . Number of children: 2  . Years of education: Not on file  . Highest education level: Not on file  Occupational History  . Not on file  Tobacco Use  . Smoking status: Never Smoker  . Smokeless tobacco: Never Used  Substance and Sexual Activity  . Alcohol use: No  . Drug use: No  . Sexual activity: Not Currently  Other Topics Concern  . Not on file  Social History Narrative  . Not on file   Social Determinants of Health   Financial Resource Strain:   . Difficulty of Paying Living Expenses: Not on file  Food Insecurity:   . Worried About Charity fundraiser in the Last Year: Not on file  . Ran Out of Food in the Last Year: Not on file  Transportation Needs:   . Lack of Transportation (Medical): Not on file  . Lack of Transportation (Non-Medical): Not on file  Physical Activity:   . Days of Exercise per Week: Not on file  . Minutes of Exercise per Session: Not on  file  Stress:   . Feeling of Stress : Not on file  Social Connections:   . Frequency of Communication with Friends and Family: Not on file  . Frequency of Social Gatherings with Friends and Family: Not on file  . Attends Religious Services: Not on file  . Active Member of Clubs or Organizations: Not on file  . Attends Archivist Meetings: Not on file  . Marital Status: Not on file  Intimate Partner Violence:   . Fear of Current or Ex-Partner: Not on file  . Emotionally Abused: Not on file  . Physically Abused: Not on file  . Sexually Abused: Not on file    Review of Systems  Constitution: Negative for decreased appetite, malaise/fatigue, weight gain  and weight loss.  Eyes: Negative for visual disturbance.  Cardiovascular: Positive for dyspnea on exertion. Negative for chest pain, claudication, leg swelling, orthopnea, palpitations and syncope.  Respiratory: Negative for hemoptysis and wheezing.   Endocrine: Negative for cold intolerance and heat intolerance.  Hematologic/Lymphatic: Does not bruise/bleed easily.  Skin: Negative for nail changes.  Musculoskeletal: Negative for muscle weakness and myalgias.  Gastrointestinal: Negative for abdominal pain, change in bowel habit, nausea and vomiting.  Neurological: Negative for difficulty with concentration, dizziness, focal weakness and headaches.  Psychiatric/Behavioral: Negative for altered mental status and suicidal ideas.  All other systems reviewed and are negative.     Objective:  Blood pressure (!) 146/53, pulse 78, temperature 98 F (36.7 C), temperature source Temporal, resp. rate 16, height 5\' 5"  (1.651 m), weight 232 lb (105.2 kg), SpO2 97 %. Body mass index is 38.61 kg/m.     SIX MIN WALK 04/09/2019 12/04/2018  Supplimental Oxygen during Test? (L/min) No No  Laps 17 19  Partial Lap (in Meters) 340 370.6  Baseline Heartrate 79 80  Baseline SPO2 100 97  Heartrate 132 119  SPO2 96 99  Heartrate 115 106  SPO2 98 98  Stopped or Paused before Six Minutes Yes No  Other Symptoms at end of Exercise SOB -  Distance Completed 918 1016.6                Physical Exam  Constitutional: She is oriented to person, place, and time. Vital signs are normal. She appears well-developed and well-nourished.  Morbidly obese  HENT:  Head: Normocephalic and atraumatic.  Cardiovascular: Normal rate, regular rhythm, normal heart sounds and intact distal pulses.  2+ pitting edema  Pulmonary/Chest: Effort normal and breath sounds normal. No accessory muscle usage. No respiratory distress.  Abdominal: Soft. Bowel sounds are normal.  Musculoskeletal:        General: Normal range of motion.      Cervical back: Normal range of motion.  Neurological: She is alert and oriented to person, place, and time.  Skin: Skin is warm and dry.  Vitals reviewed.  Radiology: No results found.  Laboratory examination:    CMP Latest Ref Rng & Units 12/11/2018 03/18/2015 01/14/2015  Glucose 70 - 99 mg/dL 98 100 105  BUN 8 - 23 mg/dL 22 17.7 18.8  Creatinine 0.44 - 1.00 mg/dL 1.00 1.1 1.1  Sodium 135 - 145 mmol/L 134(L) 140 139  Potassium 3.5 - 5.1 mmol/L 4.4 4.8 4.4  Chloride 98 - 111 mmol/L 104 - -  CO2 22 - 32 mmol/L 25 26 25   Calcium 8.9 - 10.3 mg/dL 9.4 9.7 9.5  Total Protein 6.5 - 8.1 g/dL 7.4 7.5 6.8  Total Bilirubin 0.3 - 1.2 mg/dL 0.6 0.31 <0.30  Alkaline Phos 38 - 126 U/L 66 90 93  AST 15 - 41 U/L 18 17 16   ALT 0 - 44 U/L 23 17 18    CBC Latest Ref Rng & Units 12/11/2018 03/18/2015 01/14/2015  WBC 4.0 - 10.5 K/uL 7.1 7.3 7.3  Hemoglobin 12.0 - 15.0 g/dL 12.9 11.8 11.7  Hematocrit 36.0 - 46.0 % 40.8 35.9 36.1  Platelets 150 - 400 K/uL 216 215 213   Lipid Panel  No results found for: CHOL, TRIG, HDL, CHOLHDL, VLDL, LDLCALC, LDLDIRECT HEMOGLOBIN A1C No results found for: HGBA1C, MPG TSH No results for input(s): TSH in the last 8760 hours.  PRN Meds:. Medications Discontinued During This Encounter  Medication Reason  . fluticasone (FLONASE) 50 MCG/ACT nasal spray Error  . traMADol (ULTRAM) 50 MG tablet Error  . hydrocortisone cream 1 % Error  . amLODipine (NORVASC) 5 MG tablet Discontinued by provider  . lisinopril (ZESTRIL) 10 MG tablet Discontinued by provider  . furosemide (LASIX) 40 MG tablet Reorder   Current Meds  Medication Sig  . amLODipine (NORVASC) 5 MG tablet Take 1 tablet (5 mg total) by mouth daily.  . ARIPiprazole (ABILIFY) 15 MG tablet Take 15 mg by mouth at bedtime.   . benztropine (COGENTIN) 0.5 MG tablet Take 0.5 mg by mouth daily.   . Cholecalciferol (VITAMIN D) 125 MCG (5000 UT) CAPS Take 5,000 Units by mouth daily.  . CRESTOR 20 MG tablet Take 20 mg  by mouth daily.   Marland Kitchen escitalopram (LEXAPRO) 20 MG tablet Take 20 mg by mouth at bedtime.   . megestrol (MEGACE) 40 MG tablet TAKE ONE TABLET BY MOUTH DAILY  . senna-docusate (SENOKOT-S) 8.6-50 MG tablet Take 2 tablets by mouth at bedtime. For AFTER surgery, do not take if having diarrhea  . spironolactone (ALDACTONE) 25 MG tablet Take 25 mg by mouth daily.  . VENTOLIN HFA 108 (90 Base) MCG/ACT inhaler Inhale 2 puffs into the lungs every 6 (six) hours as needed for wheezing or shortness of breath.   . [DISCONTINUED] amLODipine (NORVASC) 5 MG tablet Take 1 tablet (5 mg total) by mouth daily.  . [DISCONTINUED] lisinopril (ZESTRIL) 10 MG tablet Take 10 mg by mouth daily.     Cardiac Studies:   Echocardiogram 02/13/2019:  Normal LV systolic function with EF 60%. Left ventricle cavity is normal in size. Mild concentric hypertrophy of the left ventricle. Normal global wall motion. Normal diastolic filling pattern. Calculated EF 60%. Structurally normal mitral valve.  Mild (Grade I) mitral regurgitation. Structurally normal tricuspid valve.  Mild tricuspid regurgitation. Mild to moderate pulmonary hypertension. RVSP measures 41 mmHg. Compared to study done on 11/20/2018, PA pressure was measured at 54 mm Hg.   Assessment:   Pulmonary hypertension ([place]) - Plan: Ambulatory referral to Pulmonology  Dyspnea on exertion - Plan: Ambulatory referral to Pulmonology, PCV MYOCARDIAL PERFUSION WITH LEXISCAN  Bilateral leg edema  Primary hypertension - Plan: Basic metabolic panel  Morbid obesity with BMI of 40.0-44.9, adult ([place])  EKG 03/11/2019: Normal sinus rhythm at 70 bpm, normal axis, no evidence of ischemia.   Recommendations:   Patient is here for follow-up.  She is tolerating amlodipine well.  She has developed 2+ pitting edema on bilateral lower extremities, but is also been out of her Lasix.  I have refilled her Lasix for her.  If she continues to have leg swelling despite this, will consider  stopping her amlodipine.  She was noted to be short of breath even at rest, 6-minute walk test  did not show any significant desaturations; however, I am concerned about her marked dyspnea.  Do feel that we should evaluate for ischemic evaluation, will arrange for Lexiscan nuclear stress testing.  She is unable to walk and treadmill due to dyspnea.  I also suspect she may have restrictive lung disease that may be contributing.  Will place referral to pulmonary for further evaluation.  If pulmonary evaluation is unyielding, will need to pursue right heart cath.  Continue to suspect her pulmonary hypertension on echocardiogram is related to obesity.  Counseled her on the importance of dietary changes.  Her blood pressure does continue to be uncontrolled, will discontinue lisinopril and change to valsartan hydrochlorothiazide.  Will check BMP in 10 days for surveillance of kidney function.  We will see her back in 3 weeks for close follow-up.   Miquel Dunn, MSN, APRN, FNP-C [facility]. [place] Office: [phone] Fax: [phone]

## 2019-04-09 NOTE — Patient Instructions (Signed)
Stop Lisinopril  Start Valsartan HCT 1 tablet daily  Continue with other medications, I have sent new script of Lasix to pharmacy to be taken in the AM  Will get a stress test to evaluate for any blockages  Will also place referral to lung doctors for evaluation  Need labs in 10 days at [place]

## 2019-04-14 DIAGNOSIS — G629 Polyneuropathy, unspecified: Secondary | ICD-10-CM | POA: Diagnosis not present

## 2019-04-14 DIAGNOSIS — N1831 Chronic kidney disease, stage 3a: Secondary | ICD-10-CM | POA: Diagnosis not present

## 2019-04-14 DIAGNOSIS — E559 Vitamin D deficiency, unspecified: Secondary | ICD-10-CM | POA: Diagnosis not present

## 2019-04-14 DIAGNOSIS — I5032 Chronic diastolic (congestive) heart failure: Secondary | ICD-10-CM | POA: Diagnosis not present

## 2019-04-14 DIAGNOSIS — I10 Essential (primary) hypertension: Secondary | ICD-10-CM | POA: Diagnosis not present

## 2019-04-14 DIAGNOSIS — J302 Other seasonal allergic rhinitis: Secondary | ICD-10-CM | POA: Diagnosis not present

## 2019-04-14 DIAGNOSIS — E782 Mixed hyperlipidemia: Secondary | ICD-10-CM | POA: Diagnosis not present

## 2019-04-14 DIAGNOSIS — Z0001 Encounter for general adult medical examination with abnormal findings: Secondary | ICD-10-CM | POA: Diagnosis not present

## 2019-04-14 DIAGNOSIS — R7303 Prediabetes: Secondary | ICD-10-CM | POA: Diagnosis not present

## 2019-04-16 ENCOUNTER — Other Ambulatory Visit: Payer: Self-pay

## 2019-04-16 ENCOUNTER — Ambulatory Visit: Payer: Medicare Other

## 2019-04-16 DIAGNOSIS — R0609 Other forms of dyspnea: Secondary | ICD-10-CM

## 2019-04-16 DIAGNOSIS — R0600 Dyspnea, unspecified: Secondary | ICD-10-CM

## 2019-04-23 ENCOUNTER — Other Ambulatory Visit: Payer: Self-pay | Admitting: Cardiology

## 2019-04-23 DIAGNOSIS — I10 Essential (primary) hypertension: Secondary | ICD-10-CM | POA: Diagnosis not present

## 2019-04-24 LAB — BASIC METABOLIC PANEL
BUN/Creatinine Ratio: 19 (ref 12–28)
BUN: 23 mg/dL (ref 8–27)
CO2: 21 mmol/L (ref 20–29)
Calcium: 9.6 mg/dL (ref 8.7–10.3)
Chloride: 108 mmol/L — ABNORMAL HIGH (ref 96–106)
Creatinine, Ser: 1.19 mg/dL — ABNORMAL HIGH (ref 0.57–1.00)
GFR calc Af Amer: 52 mL/min/{1.73_m2} — ABNORMAL LOW (ref >59)
GFR calc non Af Amer: 45 mL/min/{1.73_m2} — ABNORMAL LOW (ref >59)
Glucose: 121 mg/dL — ABNORMAL HIGH (ref 65–99)
Potassium: 4.5 mmol/L (ref 3.5–5.2)
Sodium: 142 mmol/L (ref 134–144)

## 2019-04-30 ENCOUNTER — Encounter: Payer: Self-pay | Admitting: Cardiology

## 2019-04-30 ENCOUNTER — Other Ambulatory Visit: Payer: Self-pay

## 2019-04-30 ENCOUNTER — Ambulatory Visit: Payer: Medicare Other | Admitting: Cardiology

## 2019-04-30 VITALS — BP 149/56 | HR 90 | Temp 99.0°F | Ht 65.0 in | Wt 235.0 lb

## 2019-04-30 DIAGNOSIS — I10 Essential (primary) hypertension: Secondary | ICD-10-CM

## 2019-04-30 DIAGNOSIS — R0609 Other forms of dyspnea: Secondary | ICD-10-CM

## 2019-04-30 DIAGNOSIS — R600 Localized edema: Secondary | ICD-10-CM

## 2019-04-30 DIAGNOSIS — I272 Pulmonary hypertension, unspecified: Secondary | ICD-10-CM | POA: Diagnosis not present

## 2019-04-30 DIAGNOSIS — R0600 Dyspnea, unspecified: Secondary | ICD-10-CM

## 2019-04-30 DIAGNOSIS — Z6841 Body Mass Index (BMI) 40.0 and over, adult: Secondary | ICD-10-CM | POA: Diagnosis not present

## 2019-04-30 MED ORDER — VALSARTAN-HYDROCHLOROTHIAZIDE 160-12.5 MG PO TABS: 1.0000 | ORAL_TABLET | Freq: Every day | ORAL | 1 refills | Status: DC

## 2019-04-30 MED ORDER — FUROSEMIDE 40 MG PO TABS: 40.0000 mg | ORAL_TABLET | Freq: Every day | ORAL | 2 refills | Status: DC

## 2019-04-30 NOTE — Progress Notes (Signed)
Primary Physician:  Elizabeth Mccreedy, MD   Patient ID: Elizabeth [name], female    DOB: [birth year], 74 y.o.   MRN: [id number]  Subjective:    Chief Complaint  Patient presents with  . Hypertension    pt has severe SOB  . Shortness of Breath    HPI: Elizabeth [name]  is a 74 y.o. 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 She was found to have elevated PA pressures at 54 mmHg on echo in Sept 2020, recently underwent repeat echo in 02/13/2019 that showed slight improvement in PA pressures to 41 mmHg.   At her last office visit a few weeks ago, she was found to be not taking Lasix. Lisinopril was changed to Valsartan HCT for better BP control. She underwent lexiscan nuclear stress testing and now presents for follow up.  Symptoms remain unchanged. Has dyspnea on minimal exertion and wheezing. She does notice some improvement with inhaler use. No chest pain. Leg swelling is unchanged. She is trying to make dietary changes.  Denies any former tobacco use. States that her brother and sister both have CHF and have had stents placed, but she is unsure of all the details.   Past Medical History:  Diagnosis Date  . Anxiety   . Breast CA ([place])    (Lt) breast ca dx 02/2010  . Breast cancer ([place]) 03/31/2011  . Depression   . GERD (gastroesophageal reflux disease) 12/17/2012  . Hypertension   . Hypertension 12/17/2012  . Nausea alone 12/17/2012  . Schizophrenia [facility])     Past Surgical History:  Procedure Laterality Date  . BREAST SURGERY    . HYSTEROTOMY  12/2018  . MASTECTOMY Left   . ROBOTIC ASSISTED TOTAL HYSTERECTOMY WITH BILATERAL SALPINGO OOPHERECTOMY Bilateral 12/17/2018   Procedure: XI ROBOTIC ASSISTED TOTAL HYSTERECTOMY WITH BILATERAL SALPINGO OOPHORECTOMY;  Surgeon: Everitt Amber, MD;  Location: [facility];   Service: Gynecology;  Laterality: Bilateral;  . SENTINEL NODE BIOPSY N/A 12/17/2018   Procedure: SENTINEL NODE BIOPSY;  Surgeon: Everitt Amber, MD;  Location: [facility];  Service: Gynecology;  Laterality: N/A;  . TONSILLECTOMY     Social History   Tobacco Use  . Smoking status: Never Smoker  . Smokeless tobacco: Never Used  Substance Use Topics  . Alcohol use: No  . Drug use: No     Review of Systems  Cardiovascular: Positive for dyspnea on exertion and leg swelling. Negative for chest pain, claudication, orthopnea, palpitations and syncope.  Neurological: Negative for dizziness, focal weakness and headaches.  All other systems reviewed and are negative.     Objective:  Blood pressure (!) 149/56, pulse 90, temperature 99 F (37.2 C), height 5\' 5"  (1.651 m), weight 235 lb (106.6 kg), SpO2 96 %. Body mass index is 39.11 kg/m.     SIX MIN WALK 04/09/2019 12/04/2018  Supplimental Oxygen during Test? (L/min) No No  Laps 17 19  Partial Lap (in Meters) 340 370.6  Baseline Heartrate 79 80  Baseline SPO2 100 97  Heartrate 132 119  SPO2 96 99  Heartrate 115 106  SPO2 98 98  Stopped or Paused before Six Minutes Yes No  Other Symptoms at end of Exercise SOB -  Distance Completed 918 1016.6                Physical Exam  Constitutional: She is  oriented to person, place, and time. Vital signs are normal. She appears well-developed and well-nourished.  Moderately obese  HENT:  Head: Normocephalic and atraumatic.  Cardiovascular: Normal rate, regular rhythm, normal heart sounds and intact distal pulses.  2+ pitting edema  Pulmonary/Chest: Effort normal and breath sounds normal. No accessory muscle usage. No respiratory distress.  Abdominal: Soft. Bowel sounds are normal.  Musculoskeletal:        General: Normal range of motion.     Cervical back: Normal range of motion.  Neurological: She is alert and oriented to person, place, and time.  Skin: Skin is warm and dry.  Vitals  reviewed.  Radiology: No results found.  Laboratory examination:    CMP Latest Ref Rng & Units 04/23/2019 12/11/2018 03/18/2015  Glucose 65 - 99 mg/dL 121(H) 98 100  BUN 8 - 27 mg/dL 23 22 17.7  Creatinine 0.57 - 1.00 mg/dL 1.19(H) 1.00 1.1  Sodium 134 - 144 mmol/L 142 134(L) 140  Potassium 3.5 - 5.2 mmol/L 4.5 4.4 4.8  Chloride 96 - 106 mmol/L 108(H) 104 -  CO2 20 - 29 mmol/L 21 25 26   Calcium 8.7 - 10.3 mg/dL 9.6 9.4 9.7  Total Protein 6.5 - 8.1 g/dL - 7.4 7.5  Total Bilirubin 0.3 - 1.2 mg/dL - 0.6 0.31  Alkaline Phos 38 - 126 U/L - 66 90  AST 15 - 41 U/L - 18 17  ALT 0 - 44 U/L - 23 17   CBC Latest Ref Rng & Units 12/11/2018 03/18/2015 01/14/2015  WBC 4.0 - 10.5 K/uL 7.1 7.3 7.3  Hemoglobin 12.0 - 15.0 g/dL 12.9 11.8 11.7  Hematocrit 36.0 - 46.0 % 40.8 35.9 36.1  Platelets 150 - 400 K/uL 216 215 213   Lipid Panel  No results found for: CHOL, TRIG, HDL, CHOLHDL, VLDL, LDLCALC, LDLDIRECT HEMOGLOBIN A1C No results found for: HGBA1C, MPG TSH No results for input(s): TSH in the last 8760 hours.  PRN Meds:. Medications Discontinued During This Encounter  Medication Reason  . valsartan-hydrochlorothiazide (DIOVAN-HCT) 320-12.5 MG tablet Error  . furosemide (LASIX) 40 MG tablet Reorder   Current Meds  Medication Sig  . amLODipine (NORVASC) 5 MG tablet Take 1 tablet (5 mg total) by mouth daily.  . ARIPiprazole (ABILIFY) 15 MG tablet Take 15 mg by mouth at bedtime.   . benztropine (COGENTIN) 0.5 MG tablet Take 0.5 mg by mouth daily.   . Cholecalciferol (VITAMIN D) 125 MCG (5000 UT) CAPS Take 5,000 Units by mouth daily.  Marland Kitchen escitalopram (LEXAPRO) 20 MG tablet Take 20 mg by mouth at bedtime.   . megestrol (MEGACE) 40 MG tablet TAKE ONE TABLET BY MOUTH DAILY  . senna-docusate (SENOKOT-S) 8.6-50 MG tablet Take 2 tablets by mouth at bedtime. For AFTER surgery, do not take if having diarrhea  . spironolactone (ALDACTONE) 25 MG tablet Take 25 mg by mouth 2 (two) times daily.   .  VENTOLIN HFA 108 (90 Base) MCG/ACT inhaler Inhale 2 puffs into the lungs every 6 (six) hours as needed for wheezing or shortness of breath.     Cardiac Studies:   Lexiscan (Walking with mod Bruce)Tetrofosmin Stress Test  04/16/2019: Nondiagnostic ECG stress. There is a fixed moderate defect in the inferior and apical regions consistent with soft tissue attenuation. Overall LV systolic function is normal without regional wall motion abnormalities. Stress LV EF: 71%.  No previous exam available for comparison. Low risk study.   Echocardiogram 02/13/2019:  Normal LV systolic function with EF 60%. Left ventricle  cavity is normal in size. Mild concentric hypertrophy of the left ventricle. Normal global wall motion. Normal diastolic filling pattern. Calculated EF 60%. Structurally normal mitral valve.  Mild (Grade I) mitral regurgitation. Structurally normal tricuspid valve.  Mild tricuspid regurgitation. Mild to moderate pulmonary hypertension. RVSP measures 41 mmHg. Compared to study done on 11/20/2018, PA pressure was measured at 54 mm Hg.   Assessment:   Dyspnea on exertion  Pulmonary hypertension ([facility])  Bilateral leg edema  Primary hypertension  Morbid obesity with BMI of 40.0-44.9, adult ([place])  EKG 03/11/2019: Normal sinus rhythm at 70 bpm, normal axis, no evidence of ischemia.   Recommendations:   Patient presents for follow-up for hypertension and dyspnea on exertion.  Unfortunately, she continues to is still not be on Lasix and valsartan hydrochlorothiazide.  I have resent these prescriptions to the pharmacy, and encouraged her to contact me if she has issues with obtaining these.  Her blood pressure has slightly improved since she was last seen.  Hopefully her leg edema will improve with starting Lasix.  I am concerned about her marked dyspnea.  She is noticeably short of breath with minimal activity and also has wheezing.  She does notice some slight improvement with inhaler  use, feel that she should be evaluated by pulmonary for restrictive lung disease.  She has not yet heard on pulmonary referral that was placed at her last office visit, will follow up on this.  I have reviewed and discussed her recent stress test, no evidence of ischemia.  She does have pulmonary hypertension by echocardiogram; however, by recent echo her PA pressures had slightly improved.  Depending upon pulmonary evaluation and recommendations, will have a low threshold to perform right heart catheterization for further evaluation.  She did not have significant desaturations on her previous 6-minute walk test; however, again was noted to be markedly dyspneic.  I do feel that her weight is probably contributing to some of her dyspnea on exertion, encouraged her to continue to work on her weight loss.  I would like to see her back in 2 weeks for close follow-up.   Miquel Dunn, MSN, APRN, FNP-C [facility] Cardiovascular. [place] Office: [phone] Fax: [phone]

## 2019-05-14 ENCOUNTER — Other Ambulatory Visit: Payer: Self-pay

## 2019-05-14 ENCOUNTER — Ambulatory Visit: Payer: Medicare Other | Admitting: Cardiology

## 2019-05-14 ENCOUNTER — Encounter: Payer: Self-pay | Admitting: Cardiology

## 2019-05-14 VITALS — BP 138/53 | HR 76 | Temp 98.0°F | Ht 65.0 in | Wt 227.0 lb

## 2019-05-14 DIAGNOSIS — I10 Essential (primary) hypertension: Secondary | ICD-10-CM

## 2019-05-14 DIAGNOSIS — I272 Pulmonary hypertension, unspecified: Secondary | ICD-10-CM

## 2019-05-14 DIAGNOSIS — R600 Localized edema: Secondary | ICD-10-CM | POA: Diagnosis not present

## 2019-05-14 DIAGNOSIS — R0609 Other forms of dyspnea: Secondary | ICD-10-CM

## 2019-05-14 DIAGNOSIS — R0600 Dyspnea, unspecified: Secondary | ICD-10-CM | POA: Diagnosis not present

## 2019-05-14 DIAGNOSIS — Z6841 Body Mass Index (BMI) 40.0 and over, adult: Secondary | ICD-10-CM | POA: Diagnosis not present

## 2019-05-14 NOTE — Progress Notes (Signed)
Primary Physician:  Benito Mccreedy, MD   Patient ID: Elizabeth [name], female    DOB: February 21, 1946, 74 y.o.   MRN: [id number]  Subjective:    Chief Complaint  Patient presents with  . Hypertension  . DOE  . Follow-up    2 week    HPI: Elizabeth [name]  is a 74 y.o. 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 She was found to have elevated PA pressures at 54 mmHg on echo in Sept 2020, recently underwent repeat echo in 02/13/2019 that showed slight improvement in PA pressures to 41 mmHg.   Last seen 2 weeks ago, she is now on Lasix and Valsartan. Originally we had issues with patient receiving her prescription. Since being on the medication her symptoms have improved and she is feeling much better. She has upcoming appt with Pulmonary next week.   Denies any former tobacco use. States that her brother and sister both have CHF and have had stents placed, but she is unsure of all the details.   Past Medical History:  Diagnosis Date  . Anxiety   . Breast CA ([place])    (Lt) breast ca dx 02/2010  . Breast cancer ([place]) 03/31/2011  . Depression   . GERD (gastroesophageal reflux disease) 12/17/2012  . Hypertension   . Hypertension 12/17/2012  . Nausea alone 12/17/2012  . Schizophrenia [facility])     Past Surgical History:  Procedure Laterality Date  . BREAST SURGERY    . HYSTEROTOMY  12/2018  . MASTECTOMY Left   . ROBOTIC ASSISTED TOTAL HYSTERECTOMY WITH BILATERAL SALPINGO OOPHERECTOMY Bilateral 12/17/2018   Procedure: XI ROBOTIC ASSISTED TOTAL HYSTERECTOMY WITH BILATERAL SALPINGO OOPHORECTOMY;  Surgeon: Everitt Amber, MD;  Location: [facility];  Service: Gynecology;  Laterality: Bilateral;  . SENTINEL NODE BIOPSY N/A 12/17/2018   Procedure: SENTINEL NODE BIOPSY;  Surgeon: Everitt Amber, MD;  Location: [facility];  Service:  Gynecology;  Laterality: N/A;  . TONSILLECTOMY     Social History   Tobacco Use  . Smoking status: Never Smoker  . Smokeless tobacco: Never Used  Substance Use Topics  . Alcohol use: No  . Drug use: No     Review of Systems  Cardiovascular: Positive for dyspnea on exertion and leg swelling. Negative for chest pain, claudication, orthopnea, palpitations and syncope.  Neurological: Negative for dizziness, focal weakness and headaches.  All other systems reviewed and are negative.     Objective:  Blood pressure (!) 138/53, pulse 76, temperature 98 F (36.7 C), height 5\' 5"  (1.651 m), weight 227 lb (103 kg), SpO2 95 %. Body mass index is 37.77 kg/m.     SIX MIN WALK 04/09/2019 12/04/2018  Supplimental Oxygen during Test? (L/min) No No  Laps 17 19  Partial Lap (in Meters) 340 370.6  Baseline Heartrate 79 80  Baseline SPO2 100 97  Heartrate 132 119  SPO2 96 99  Heartrate 115 106  SPO2 98 98  Stopped or Paused before Six Minutes Yes No  Other Symptoms at end of Exercise SOB -  Distance Completed 918 1016.6                Physical Exam  Constitutional: She is oriented to person, place, and time. Vital signs are normal. She appears well-developed and well-nourished.  Moderately obese  HENT:  Head: Normocephalic and atraumatic.  Cardiovascular: Normal  rate, regular rhythm, normal heart sounds and intact distal pulses.  Trace edema  Pulmonary/Chest: Effort normal and breath sounds normal. No accessory muscle usage. No respiratory distress.  Abdominal: Soft. Bowel sounds are normal.  Musculoskeletal:        General: Normal range of motion.     Cervical back: Normal range of motion.  Neurological: She is alert and oriented to person, place, and time.  Skin: Skin is warm and dry.  Vitals reviewed.  Radiology: No results found.  Laboratory examination:    CMP Latest Ref Rng & Units 04/23/2019 12/11/2018 03/18/2015  Glucose 65 - 99 mg/dL 121(H) 98 100  BUN 8 - 27 mg/dL  23 22 17.7  Creatinine 0.57 - 1.00 mg/dL 1.19(H) 1.00 1.1  Sodium 134 - 144 mmol/L 142 134(L) 140  Potassium 3.5 - 5.2 mmol/L 4.5 4.4 4.8  Chloride 96 - 106 mmol/L 108(H) 104 -  CO2 20 - 29 mmol/L 21 25 26   Calcium 8.7 - 10.3 mg/dL 9.6 9.4 9.7  Total Protein 6.5 - 8.1 g/dL - 7.4 7.5  Total Bilirubin 0.3 - 1.2 mg/dL - 0.6 0.31  Alkaline Phos 38 - 126 U/L - 66 90  AST 15 - 41 U/L - 18 17  ALT 0 - 44 U/L - 23 17   CBC Latest Ref Rng & Units 12/11/2018 03/18/2015 01/14/2015  WBC 4.0 - 10.5 K/uL 7.1 7.3 7.3  Hemoglobin 12.0 - 15.0 g/dL 12.9 11.8 11.7  Hematocrit 36.0 - 46.0 % 40.8 35.9 36.1  Platelets 150 - 400 K/uL 216 215 213   Lipid Panel  No results found for: CHOL, TRIG, HDL, CHOLHDL, VLDL, LDLCALC, LDLDIRECT HEMOGLOBIN A1C No results found for: HGBA1C, MPG TSH No results for input(s): TSH in the last 8760 hours.  PRN Meds:. There are no discontinued medications. Current Meds  Medication Sig  . amLODipine (NORVASC) 5 MG tablet Take 1 tablet (5 mg total) by mouth daily.  . ARIPiprazole (ABILIFY) 15 MG tablet Take 15 mg by mouth at bedtime.   . benztropine (COGENTIN) 0.5 MG tablet Take 0.5 mg by mouth daily.   . Cholecalciferol (VITAMIN D) 125 MCG (5000 UT) CAPS Take 5,000 Units by mouth daily.  Marland Kitchen escitalopram (LEXAPRO) 20 MG tablet Take 20 mg by mouth at bedtime.   . fluticasone (FLONASE) 50 MCG/ACT nasal spray   . furosemide (LASIX) 40 MG tablet Take 1 tablet (40 mg total) by mouth daily.  . megestrol (MEGACE) 40 MG tablet TAKE ONE TABLET BY MOUTH DAILY  . senna-docusate (SENOKOT-S) 8.6-50 MG tablet Take 2 tablets by mouth at bedtime. For AFTER surgery, do not take if having diarrhea  . spironolactone (ALDACTONE) 25 MG tablet Take 25 mg by mouth 2 (two) times daily.   . valsartan-hydrochlorothiazide (DIOVAN HCT) 160-12.5 MG tablet Take 1 tablet by mouth daily.  . VENTOLIN HFA 108 (90 Base) MCG/ACT inhaler Inhale 2 puffs into the lungs every 6 (six) hours as needed for wheezing  or shortness of breath.     Cardiac Studies:   Lexiscan (Walking with mod Bruce)Tetrofosmin Stress Test  04/16/2019: Nondiagnostic ECG stress. There is a fixed moderate defect in the inferior and apical regions consistent with soft tissue attenuation. Overall LV systolic function is normal without regional wall motion abnormalities. Stress LV EF: 71%.  No previous exam available for comparison. Low risk study.   Echocardiogram 02/13/2019:  Normal LV systolic function with EF 60%. Left ventricle cavity is normal in size. Mild concentric hypertrophy of the left  ventricle. Normal global wall motion. Normal diastolic filling pattern. Calculated EF 60%. Structurally normal mitral valve.  Mild (Grade I) mitral regurgitation. Structurally normal tricuspid valve.  Mild tricuspid regurgitation. Mild to moderate pulmonary hypertension. RVSP measures 41 mmHg. Compared to study done on 11/20/2018, PA pressure was measured at 54 mm Hg.   Assessment:   Dyspnea on exertion  Primary hypertension - Plan: Basic metabolic panel, Basic metabolic panel  Pulmonary hypertension (HCC)  Bilateral leg edema  Morbid obesity with BMI of 40.0-44.9, adult ([place])  EKG 03/11/2019: Normal sinus rhythm at 70 bpm, normal axis, no evidence of ischemia.   Recommendations:   Patient presents for 2-week follow-up, I have been seeing patient frequently due to her marked dyspnea on minimal exertion and leg edema.  Since being on Lasix, she has had significant improvement in her symptoms.  She also has some improvement in her dyspnea with use of inhaler.  She has upcoming appointment with pulmonary.  Although she does have pulmonary hypertension by echocardiogram, would recommend further evaluation with pulmonary for etiology for symptoms.  If her dyspnea is felt to be out of proportion or pulmonary issues, will consider further evaluation with right heart catheterization.  Blood pressure has improved with addition of  valsartan, will continue the same. Will check BMP in 2 weeks for follow up on kidney function.  Continue with dietary changes to promote weight loss.  We will plan to see her back in 6 weeks for follow-up after pulmonary evaluation.    Miquel Dunn, MSN, APRN, FNP-C [facility]. [place] Office: [phone] Fax: [phone]

## 2019-05-15 ENCOUNTER — Institutional Professional Consult (permissible substitution): Payer: Medicare Other | Admitting: Critical Care Medicine

## 2019-05-21 NOTE — Progress Notes (Signed)
Synopsis: Referred in March 2021 for DOE by Miquel Dunn, *.   Subjective:   PATIENT ID: Elizabeth [name] GENDER: female DOB: 1946/02/27, MRN: [id number]  Chief Complaint  Patient presents with  . Consult    Patient is here to establish care for pulmonary hypertension. Patient has shortness of breath with exertion for the last couple months. Patient was taken off Amolidipine and put on Valsartan and Lasix and patient and daughter state she is much better.     Elizabeth [name] is a 74 year old woman who presents for evaluation of shortness of breath.  She is accompanied by her daughter.  She was referred by cardiology for evaluation of shortness of breath that has been going on for few months that was associated with a change in her antihypertensive regimen from lisinopril to amlodipine.  She had associated leg edema, abdominal distention, generalized bloating associated with weight gain and a pruritic rash.  She has subsequently been switched from amlodipine to valsartan, hydrochlorothiazide, and Lasix with subsequent weight loss and improvement in her dyspnea.  Her dyspnea is about 70% better than it was, but she is not back to her baseline.  When her symptoms were they are worse she was having shortness of breath with talking and was barely able to walk around in her own house.  She is currently able to walk with a walker.  She was having wheezing which has resolved, dry cough which has resolved.  She has a history of allergies but no previous history of lung disease.  Family history of her lung disease is significant for lung cancer in both parents (father was a heavy smoker).  She is a never smoker.  Binnie Kand, NP cardiology note reviewed from 04/09/2019.  She had pH on an echo that was felt to be due to obesity.  It had improved on a subsequent echocardiogram.  She has had episodes of snoring with witnessed apneas.  Reports that she had a negative PSG about 2 years ago in [place].  She  has a history of left breast cancer in 2011, status post mastectomy and remains in remission.  She has a history of uterine adenocarcinoma, stage Ia grade 1, s/p hysterectomy, b/l salpingoophorectomy 12/2018.  No chemo needed.    Past Medical History:  Diagnosis Date  . Anxiety   . Breast CA ([place])    (Lt) breast ca dx 02/2010  . Breast cancer ([place]) 03/31/2011  . Depression   . GERD (gastroesophageal reflux disease) 12/17/2012  . Hypertension   . Hypertension 12/17/2012  . Nausea alone 12/17/2012  . Schizophrenia ([place])      Family History  Problem Relation Age of Onset  . Lung cancer Mother   . Lung cancer Father   . Heart attack Sister   . Heart attack Brother      Past Surgical History:  Procedure Laterality Date  . BREAST SURGERY    . HYSTEROTOMY  12/2018  . MASTECTOMY Left   . ROBOTIC ASSISTED TOTAL HYSTERECTOMY WITH BILATERAL SALPINGO OOPHERECTOMY Bilateral 12/17/2018   Procedure: XI ROBOTIC ASSISTED TOTAL HYSTERECTOMY WITH BILATERAL SALPINGO OOPHORECTOMY;  Surgeon: Everitt Amber, MD;  Location: [facility];  Service: Gynecology;  Laterality: Bilateral;  . SENTINEL NODE BIOPSY N/A 12/17/2018   Procedure: SENTINEL NODE BIOPSY;  Surgeon: Everitt Amber, MD;  Location: [facility];  Service: Gynecology;  Laterality: N/A;  . TONSILLECTOMY      Social History   Socioeconomic History  . Marital status: Divorced    Spouse  name: Not on file  . Number of children: 2  . Years of education: Not on file  . Highest education level: Not on file  Occupational History  . Not on file  Tobacco Use  . Smoking status: Passive Smoke Exposure - Never Smoker  . Smokeless tobacco: Never Used  Substance and Sexual Activity  . Alcohol use: No  . Drug use: No  . Sexual activity: Not Currently  Other Topics Concern  . Not on file  Social History Narrative  . Not on file   Social Determinants of Health   Financial Resource Strain:   . Difficulty of Paying Living Expenses:   Food Insecurity:    . Worried About Charity fundraiser in the Last Year:   . Arboriculturist in the Last Year:   Transportation Needs:   . Film/video editor (Medical):   Marland Kitchen Lack of Transportation (Non-Medical):   Physical Activity:   . Days of Exercise per Week:   . Minutes of Exercise per Session:   Stress:   . Feeling of Stress :   Social Connections:   . Frequency of Communication with Friends and Family:   . Frequency of Social Gatherings with Friends and Family:   . Attends Religious Services:   . Active Member of Clubs or Organizations:   . Attends Archivist Meetings:   Marland Kitchen Marital Status:   Intimate Partner Violence:   . Fear of Current or Ex-Partner:   . Emotionally Abused:   Marland Kitchen Physically Abused:   . Sexually Abused:      Allergies  Allergen Reactions  . Amlodipine     Severe edema, SOB, weight gain, rash     Immunization History  Administered Date(s) Administered  . Influenza Split 02/04/2012  . Influenza, High Dose Seasonal PF 12/07/2016  . Influenza, Quadrivalent, Recombinant, Inj, Pf 12/07/2018  . Influenza-Unspecified 01/11/2019  . Moderna SARS-COVID-2 Vaccination 04/29/2019  . Pneumococcal Polysaccharide-23 01/05/2012  . Tdap 01/04/2010    Outpatient Medications Prior to Visit  Medication Sig Dispense Refill  . ARIPiprazole (ABILIFY) 15 MG tablet Take 15 mg by mouth at bedtime.     . benztropine (COGENTIN) 0.5 MG tablet Take 0.5 mg by mouth daily.     . Cholecalciferol (VITAMIN D) 125 MCG (5000 UT) CAPS Take 5,000 Units by mouth daily.    . CRESTOR 20 MG tablet Take 20 mg by mouth daily.     Marland Kitchen escitalopram (LEXAPRO) 20 MG tablet Take 20 mg by mouth at bedtime.     . fluticasone (FLONASE) 50 MCG/ACT nasal spray     . furosemide (LASIX) 40 MG tablet Take 1 tablet (40 mg total) by mouth daily. 30 tablet 2  . megestrol (MEGACE) 40 MG tablet TAKE ONE TABLET BY MOUTH DAILY 60 tablet 2  . senna-docusate (SENOKOT-S) 8.6-50 MG tablet Take 2 tablets by mouth at  bedtime. For AFTER surgery, do not take if having diarrhea 30 tablet 0  . spironolactone (ALDACTONE) 25 MG tablet Take 25 mg by mouth 2 (two) times daily.     . valsartan-hydrochlorothiazide (DIOVAN HCT) 160-12.5 MG tablet Take 1 tablet by mouth daily. 30 tablet 1  . VENTOLIN HFA 108 (90 Base) MCG/ACT inhaler Inhale 2 puffs into the lungs every 6 (six) hours as needed for wheezing or shortness of breath.     Marland Kitchen amLODipine (NORVASC) 5 MG tablet Take 1 tablet (5 mg total) by mouth daily. 30 tablet 2   No facility-administered medications  prior to visit.    ROS   Objective:   Vitals:   05/22/19 0944  BP: 120/60  Pulse: 68  SpO2: 97%  Weight: 226 lb 3.2 oz (102.6 kg)  Height: 5' 0.43" (1.535 m)   97% on   RA BMI Readings from Last 3 Encounters:  05/22/19 43.55 kg/m  05/14/19 37.77 kg/m  04/30/19 39.11 kg/m   Wt Readings from Last 3 Encounters:  05/22/19 226 lb 3.2 oz (102.6 kg)  05/14/19 227 lb (103 kg)  04/30/19 235 lb (106.6 kg)    Physical Exam Vitals reviewed.  Constitutional:      Appearance: She is obese. She is not ill-appearing.  HENT:     Head: Normocephalic and atraumatic.  Eyes:     General: No scleral icterus. Cardiovascular:     Rate and Rhythm: Normal rate and regular rhythm.     Heart sounds: No murmur.  Pulmonary:     Comments: Breathing comfortably on room air, no conversational dyspnea.  Mild resting tachypnea.  Reduced bibasilar breath sounds, clear to auscultation bilaterally. Abdominal:     General: There is no distension.     Palpations: Abdomen is soft.  Musculoskeletal:     Cervical back: Neck supple.     Comments: Symmetric lower extremity edema.  Kyphosis.  Lymphadenopathy:     Cervical: No cervical adenopathy.  Skin:    General: Skin is warm and dry.     Comments: Annular erythematous rash about 3 cm on her right arm with excoriations  Neurological:     General: No focal deficit present.     Coordination: Coordination normal.   Psychiatric:        Mood and Affect: Mood normal.        Behavior: Behavior normal.      CBC    Component Value Date/Time   WBC 7.1 12/11/2018 0851   RBC 4.50 12/11/2018 0851   HGB 12.9 12/11/2018 0851   HGB 11.8 03/18/2015 1457   HCT 40.8 12/11/2018 0851   HCT 35.9 03/18/2015 1457   PLT 216 12/11/2018 0851   PLT 215 03/18/2015 1457   MCV 90.7 12/11/2018 0851   MCV 87.6 03/18/2015 1457   MCH 28.7 12/11/2018 0851   MCHC 31.6 12/11/2018 0851   RDW 14.2 12/11/2018 0851   RDW 14.3 03/18/2015 1457   LYMPHSABS 2.4 03/18/2015 1457   MONOABS 0.6 03/18/2015 1457   EOSABS 0.1 03/18/2015 1457   BASOSABS 0.0 03/18/2015 1457    CHEMISTRY No results for input(s): NA, K, CL, CO2, GLUCOSE, BUN, CREATININE, CALCIUM, MG, PHOS in the last 168 hours. CrCl cannot be calculated (Patient's most recent lab result is older than the maximum 21 days allowed.).    Chest Imaging- films reviewed: CXR, 2 view 04/11/2013-prominent right heart  Pulmonary Functions Testing Results: No flowsheet data found.    Echocardiogram 02/13/2019: LVEF 60%, mild concentric LVH, normal diastolic function.  Mild MR and TR.  Mild to moderate PAH, RVSP 41 mmHg, PA 54 mmHg  Cardiogram 11/20/2018-LVEF 60 to 65%.  Normal LA, no increased RV wall thickness. Moderately elevated RVSP 54.  Normal RA.  Aortic valve annular calcification, but normal function.  Otherwise normal valves.   Lexiscan 04/16/19:  There is a fixed moderate defect in the inferior and apical regions consistent with soft tissue attenuation. Overall LV systolic function is normal without regional wall motion abnormalities. Stress LV EF: 71%.  No previous exam available for comparison. Low risk study.     Assessment &  Plan:     ICD-10-CM   1. Morbid (severe) obesity due to excess calories (HCC)  E66.01   2. DOE (dyspnea on exertion)  R06.00 Pulmonary function test  3. Chronic heart failure with preserved ejection fraction (HCC)  I50.32      Chronic DOE- likely multifactorial due to previous weight gain and edema associated with amlodipine intolerance.  Restriction due to obesity likely also contributes.  Deconditioning from her severe symptoms while on amlodipine could also contribute to her ongoing dyspnea that is improved but not fully resolved. -Amlodipine added to allergy list.  She should not be prescribed this again in the future -Continue with diuretics per cardiology -Discussed the importance of losing weight as a strategy for long-term health. -They will obtain the PSG results from [facility].  -PFTs, although low suspicion for obstructive lung disease. -Regular physical activity.  Discussed being cautious and slow gradual improvements that would be expected in her exercise tolerance.  Possible PH on echo.  No right heart cath to confirm.  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 -Optimize risk factors, especially hypervolemia and hypertension.   RTC in 3 months with PFTs.    Current Outpatient Medications:  .  ARIPiprazole (ABILIFY) 15 MG tablet, Take 15 mg by mouth at bedtime. , Disp: , Rfl:  .  benztropine (COGENTIN) 0.5 MG tablet, Take 0.5 mg by mouth daily. , Disp: , Rfl:  .  Cholecalciferol (VITAMIN D) 125 MCG (5000 UT) CAPS, Take 5,000 Units by mouth daily., Disp: , Rfl:  .  CRESTOR 20 MG tablet, Take 20 mg by mouth daily. , Disp: , Rfl:  .  escitalopram (LEXAPRO) 20 MG tablet, Take 20 mg by mouth at bedtime. , Disp: , Rfl:  .  fluticasone (FLONASE) 50 MCG/ACT nasal spray, , Disp: , Rfl:  .  furosemide (LASIX) 40 MG tablet, Take 1 tablet (40 mg total) by mouth daily., Disp: 30 tablet, Rfl: 2 .  megestrol (MEGACE) 40 MG tablet, TAKE ONE TABLET BY MOUTH DAILY, Disp: 60 tablet, Rfl: 2 .  senna-docusate (SENOKOT-S) 8.6-50 MG tablet, Take 2 tablets by mouth at bedtime. For AFTER surgery, do not take if having diarrhea, Disp: 30 tablet, Rfl: 0 .  spironolactone  (ALDACTONE) 25 MG tablet, Take 25 mg by mouth 2 (two) times daily. , Disp: , Rfl:  .  valsartan-hydrochlorothiazide (DIOVAN HCT) 160-12.5 MG tablet, Take 1 tablet by mouth daily., Disp: 30 tablet, Rfl: 1 .  VENTOLIN HFA 108 (90 Base) MCG/ACT inhaler, Inhale 2 puffs into the lungs every 6 (six) hours as needed for wheezing or shortness of breath. , Disp: , Rfl:      Julian Hy, DO [facility] 05/22/2019 1:35 PM

## 2019-05-22 ENCOUNTER — Encounter: Payer: Self-pay | Admitting: Critical Care Medicine

## 2019-05-22 ENCOUNTER — Ambulatory Visit (INDEPENDENT_AMBULATORY_CARE_PROVIDER_SITE_OTHER): Payer: Medicare Other | Admitting: Critical Care Medicine

## 2019-05-22 ENCOUNTER — Other Ambulatory Visit: Payer: Self-pay

## 2019-05-22 DIAGNOSIS — I5032 Chronic diastolic (congestive) heart failure: Secondary | ICD-10-CM

## 2019-05-22 DIAGNOSIS — R0600 Dyspnea, unspecified: Secondary | ICD-10-CM

## 2019-05-22 DIAGNOSIS — R0609 Other forms of dyspnea: Secondary | ICD-10-CM

## 2019-05-22 NOTE — Patient Instructions (Addendum)
Thank you for visiting Dr. Carlis Abbott at [facility]. We recommend the following: Orders Placed This Encounter  Procedures  . Pulmonary function test   Orders Placed This Encounter  Procedures  . Pulmonary function test    Standing Status:   Future    Standing Expiration Date:   05/20/2020    Order Specific Question:   Where should this test be performed?    Answer:   [facility]    Order Specific Question:   Full PFT: includes the following: basic spirometry, spirometry pre & post bronchodilator, diffusion capacity (DLCO), lung volumes    Answer:   Full PFT      Return in about 3 months (around 08/22/2019). after PFTs    Please do your part to reduce the spread of COVID-19. s

## 2019-05-26 ENCOUNTER — Other Ambulatory Visit: Payer: Self-pay | Admitting: Gynecologic Oncology

## 2019-05-26 DIAGNOSIS — C55 Malignant neoplasm of uterus, part unspecified: Secondary | ICD-10-CM

## 2019-05-30 DIAGNOSIS — I10 Essential (primary) hypertension: Secondary | ICD-10-CM | POA: Diagnosis not present

## 2019-05-31 ENCOUNTER — Other Ambulatory Visit: Payer: Self-pay | Admitting: Cardiology

## 2019-05-31 DIAGNOSIS — I10 Essential (primary) hypertension: Secondary | ICD-10-CM

## 2019-05-31 DIAGNOSIS — N1831 Chronic kidney disease, stage 3a: Secondary | ICD-10-CM

## 2019-05-31 LAB — BASIC METABOLIC PANEL
BUN/Creatinine Ratio: 22 (ref 12–28)
BUN: 29 mg/dL — ABNORMAL HIGH (ref 8–27)
CO2: 24 mmol/L (ref 20–29)
Calcium: 10.1 mg/dL (ref 8.7–10.3)
Chloride: 101 mmol/L (ref 96–106)
Creatinine, Ser: 1.33 mg/dL — ABNORMAL HIGH (ref 0.57–1.00)
GFR calc Af Amer: 45 mL/min/{1.73_m2} — ABNORMAL LOW (ref >59)
GFR calc non Af Amer: 39 mL/min/{1.73_m2} — ABNORMAL LOW (ref >59)
Glucose: 129 mg/dL — ABNORMAL HIGH (ref 65–99)
Potassium: 4.5 mmol/L (ref 3.5–5.2)
Sodium: 138 mmol/L (ref 134–144)

## 2019-05-31 NOTE — Progress Notes (Signed)
Serum creatinine slightly worse on valsartan that was started for hypertension and chronic diastolic heart failure however overall her EGFR appears to be relatively stable, has had prior mild renal insufficiency.  Will place order for BMP prior to her next office visit for tracking.

## 2019-06-10 ENCOUNTER — Other Ambulatory Visit: Payer: Self-pay | Admitting: Cardiology

## 2019-06-26 ENCOUNTER — Ambulatory Visit: Payer: Medicare Other | Admitting: Cardiology

## 2019-07-14 NOTE — Progress Notes (Signed)
Primary Physician/Referring:  Benito Mccreedy, MD  Patient ID: Elizabeth [name], female    DOB: 08-05-1945, 74 y.o.   MRN: [id number]  Chief Complaint  Patient presents with  . Pulmonary Hypertension  . Leg Swelling  . Follow-up    6 week   HPI:    Elizabeth [name]  is a 74 y.o. female  with history of left breast cancer in 2011 s/p mastectomy and is now in remission, hypertension, prediabetes, uterine cancer SP hysterectomy on 12/17/2018, depression, schizophrenia, morbid obesity, chronic dyspnea on exertion and leg edema presents here for 7-monthoffice visit.  She developed worsening renal function with addition of valsartan HCT. This is being closely monitored. However blood pressure was well controlled on a prior office visit. Patient is trying her best to eat at home and has noticed improvement in dyspnea. Denies chest pain or palpitations.    Past Medical History:  Diagnosis Date  . Anxiety   . [place]    ([place] ca dx 02[place]  . [place]  . Depression   . GERD (gastroesophageal reflux disease) 12/17/2012  . Hypertension   . Hypertension 12/17/2012  . Nausea alone 12/17/2012  . Schizophrenia ([facility]    Past Surgical History:  Procedure Laterality Date  . BREAST SURGERY    . HYSTEROTOMY  12/2018  . MASTECTOMY Left   . ROBOTIC ASSISTED TOTAL HYSTERECTOMY WITH BILATERAL SALPINGO OOPHERECTOMY Bilateral 12/17/2018   Procedure: XI ROBOTIC ASSISTED TOTAL HYSTERECTOMY WITH BILATERAL SALPINGO OOPHORECTOMY;  Surgeon: REveritt Amber MD;  Location: [facility];  Service: Gynecology;  Laterality: Bilateral;  . SENTINEL NODE BIOPSY N/A 12/17/2018   Procedure: SENTINEL NODE BIOPSY;  Surgeon: REveritt Amber MD;  Location: [facility];  Service: Gynecology;  Laterality: N/A;  . TONSILLECTOMY     Family History  Problem Relation Age of Onset  . Lung cancer Mother   . Lung cancer Father   . Heart attack Sister   . Heart attack Brother     Social History    Tobacco Use  . Smoking status: Passive Smoke Exposure - Never Smoker  . Smokeless tobacco: Never Used  Substance Use Topics  . Alcohol use: No   Marital Status: Divorced  ROS  Review of Systems  Constitution: Negative for weight gain.  Cardiovascular: Positive for dyspnea on exertion and leg swelling. Negative for syncope.  Respiratory: Negative for shortness of breath.   Musculoskeletal: Positive for joint pain (right knee). Negative for joint swelling.   Objective  Blood pressure (!) 146/70, pulse 70, temperature 97.7 F (36.5 C), temperature source Temporal, resp. rate 17, height 5' (1.524 m), weight 227 lb (103 kg), SpO2 97 %.  Vitals with BMI 07/16/2019 05/22/2019 05/14/2019  Height '5\' 0"'$  5' 0.433" '5\' 5"'$   Weight 227 lbs 226 lbs 3 oz 227 lbs  BMI 44.33 [phone] Systolic [id number] Diastolic 70 60 53  Pulse 70 68 76     Physical Exam  Constitutional: No distress.  Moderately obese   Cardiovascular: Normal rate, regular rhythm, intact distal pulses and normal pulses. Exam reveals no gallop.  No murmur heard. Trace edema   Pulmonary/Chest: Effort normal and breath sounds normal. No accessory muscle usage. No respiratory distress.  Abdominal: Soft. Bowel sounds are normal.  Obese and small pannus present   Laboratory examination:   Recent Labs    12/11/18 0851 04/23/19 1611 05/30/19 1618  NA 134* 142 138  K 4.4 4.5 4.5  CL  104 108* 101  CO2 '25 21 24  '$ GLUCOSE 98 121* 129*  BUN 22 23 29*  CREATININE 1.00 1.19* 1.33*  CALCIUM 9.4 9.6 10.1  GFRNONAA 55* 45* 39*  GFRAA >60 52* 45*   CrCl cannot be calculated (Patient's most recent lab result is older than the maximum 21 days allowed.).  CMP Latest Ref Rng & Units 05/30/2019 04/23/2019 12/11/2018  Glucose 65 - 99 mg/dL 129(H) 121(H) 98  BUN 8 - 27 mg/dL 29(H) 23 22  Creatinine 0.57 - 1.00 mg/dL 1.33(H) 1.19(H) 1.00  Sodium 134 - 144 mmol/L 138 142 134(L)  Potassium 3.5 - 5.2 mmol/L 4.5 4.5 4.4  Chloride 96 -  106 mmol/L 101 108(H) 104  CO2 20 - 29 mmol/L '24 21 25  '$ Calcium 8.7 - 10.3 mg/dL 10.1 9.6 9.4  Total Protein 6.5 - 8.1 g/dL - - 7.4  Total Bilirubin 0.3 - 1.2 mg/dL - - 0.6  Alkaline Phos 38 - 126 U/L - - 66  AST 15 - 41 U/L - - 18  ALT 0 - 44 U/L - - 23   CBC Latest Ref Rng & Units 12/11/2018 03/18/2015 01/14/2015  WBC 4.0 - 10.5 K/uL 7.1 7.3 7.3  Hemoglobin 12.0 - 15.0 g/dL 12.9 11.8 11.7  Hematocrit 36.0 - 46.0 % 40.8 35.9 36.1  Platelets 150 - 400 K/uL 216 215 213   External labs:   Lipid Panel 11/18/2018:  HDL 57.000 mg LDL 69.000 mg  Cholesterol, total 143.000 mg Triglycerides 90.000 mg  A1C 6.100 %  TSH 1.960 UIU/ 10/02/2018  04/14/2019: ALT 13, AST 14, Alk Phosph 74. Total Bili 0.2.  05/30/2019: eGFR 45  Medications and allergies   Allergies  Allergen Reactions  . Amlodipine     Severe edema, SOB, weight gain, rash     Current Outpatient Medications  Medication Instructions  . ARIPiprazole (ABILIFY) 15 mg, Oral, Daily at bedtime  . benztropine (COGENTIN) 0.5 mg, Oral, Daily  . escitalopram (LEXAPRO) 20 mg, Oral, Daily at bedtime  . furosemide (LASIX) 40 mg, Oral, Daily  . labetalol (NORMODYNE) 200 mg, Oral, 2 times daily  . megestrol (MEGACE) 40 MG tablet TAKE ONE TABLET BY MOUTH DAILY  . senna-docusate (SENOKOT-S) 8.6-50 MG tablet 2 tablets, Oral, Daily at bedtime, For AFTER surgery, do not take if having diarrhea  . spironolactone (ALDACTONE) 25 mg, Oral, 2 times daily  . valsartan-hydrochlorothiazide (DIOVAN-HCT) 160-12.5 MG tablet TAKE ONE TABLET BY MOUTH DAILY  . VENTOLIN HFA 108 (90 Base) MCG/ACT inhaler 2 puffs, Inhalation, Every 6 hours PRN  . Vitamin D 5,000 Units, Oral, Daily   Radiology:   No results found.  Cardiac Studies:   Echocardiogram 02/13/2019:  Normal LV systolic function with EF 60%. Left ventricle cavity is normal in size. Mild concentric hypertrophy of the left ventricle. Normal global wall motion. Normal diastolic filling  pattern. Calculated EF 60%. Structurally normal mitral valve.  Mild (Grade I) mitral regurgitation. Structurally normal tricuspid valve.  Mild tricuspid regurgitation. Mild to moderate pulmonary hypertension. RVSP measures 41 mmHg. Compared to study done on 11/20/2018, PA pressure was measured at 54 mm Hg.   Lexiscan (Walking with mod Bruce)Tetrofosmin Stress Test  04/16/2019: Nondiagnostic ECG stress. There is a fixed moderate defect in the inferior and apical regions consistent with soft tissue attenuation. Overall LV systolic function is normal without regional wall motion abnormalities. Stress LV EF: 71%.  No previous exam available for comparison. Low risk study.   EKG   03/11/2019: Normal sinus rhythm  at 70 bpm, normal axis, no evidence of ischemia.   Assessment     ICD-10-CM   1. Dyspnea on exertion  R06.00   2. Primary hypertension  I10 labetalol (NORMODYNE) 100 MG tablet    Basic metabolic panel  3. 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 Bilateral leg edema  R60.0   5. Morbid obesity with BMI of 40.0-44.9, adult ([place])  E66.01    Z68.41      Meds ordered this encounter  Medications  . labetalol (NORMODYNE) 100 MG tablet    Sig: Take 2 tablets (200 mg total) by mouth 2 (two) times daily.    Dispense:  60 tablet    Refill:  2    Medications Discontinued During This Encounter  Medication Reason  . CRESTOR 20 MG tablet Change in therapy  . fluticasone (FLONASE) 50 MCG/ACT nasal spray Patient Preference    Recommendations:   Elizabeth [name]  is a 74 y.o.female  with history of left breast cancer in 2011 s/p mastectomy and is now in remission, hypertension, prediabetes, uterine cancer SP hysterectomy on 12/17/2018, depression, schizophrenia, morbid obesity, chronic dyspnea on exertion and leg edema presents here for 56-monthoffice visit.  Blood pressure is elevated today, I will add labetalol 100 g p.o. twice daily. 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  Today there is no clinical evidence of congestive heart failure, she needs monitoring of her renal function, I will obtain BMP. I would like to see her back in 6 weeks for follow-up. Weight loss was discussed extensively. She does have stage III chronic kidney disease but had slightly worsened with addition of valsartan HCT. She did not tolerate amlodipine due to severe leg edema.  JAdrian Prows MD, F[facility]5/02/2020, 4:09 PM PStraffordCardiovascular. PA Pager: [phone] Office: [phone]

## 2019-07-16 ENCOUNTER — Other Ambulatory Visit: Payer: Self-pay

## 2019-07-16 ENCOUNTER — Encounter: Payer: Self-pay | Admitting: Cardiology

## 2019-07-16 ENCOUNTER — Ambulatory Visit: Payer: Medicare Other | Admitting: Cardiology

## 2019-07-16 VITALS — BP 146/70 | HR 70 | Temp 97.7°F | Resp 17 | Ht 60.0 in | Wt 227.0 lb

## 2019-07-16 DIAGNOSIS — I10 Essential (primary) hypertension: Secondary | ICD-10-CM

## 2019-07-16 DIAGNOSIS — I2721 Secondary pulmonary arterial hypertension: Secondary | ICD-10-CM

## 2019-07-16 DIAGNOSIS — N1831 Chronic kidney disease, stage 3a: Secondary | ICD-10-CM | POA: Diagnosis not present

## 2019-07-16 DIAGNOSIS — Z6841 Body Mass Index (BMI) 40.0 and over, adult: Secondary | ICD-10-CM | POA: Diagnosis not present

## 2019-07-16 DIAGNOSIS — R0609 Other forms of dyspnea: Secondary | ICD-10-CM | POA: Diagnosis not present

## 2019-07-16 DIAGNOSIS — R0600 Dyspnea, unspecified: Secondary | ICD-10-CM

## 2019-07-16 MED ORDER — LABETALOL HCL 100 MG PO TABS: 200.0000 mg | ORAL_TABLET | Freq: Two times a day (BID) | ORAL | 2 refills | Status: DC

## 2019-07-17 LAB — BASIC METABOLIC PANEL
BUN/Creatinine Ratio: 16 (ref 12–28)
BUN: 20 mg/dL (ref 8–27)
CO2: 24 mmol/L (ref 20–29)
Calcium: 9.6 mg/dL (ref 8.7–10.3)
Chloride: 104 mmol/L (ref 96–106)
Creatinine, Ser: 1.29 mg/dL — ABNORMAL HIGH (ref 0.57–1.00)
GFR calc Af Amer: 47 mL/min/{1.73_m2} — ABNORMAL LOW (ref >59)
GFR calc non Af Amer: 41 mL/min/{1.73_m2} — ABNORMAL LOW (ref >59)
Glucose: 91 mg/dL (ref 65–99)
Potassium: 4.1 mmol/L (ref 3.5–5.2)
Sodium: 142 mmol/L (ref 134–144)

## 2019-07-28 ENCOUNTER — Other Ambulatory Visit: Payer: Self-pay | Admitting: Gynecologic Oncology

## 2019-07-28 ENCOUNTER — Other Ambulatory Visit: Payer: Self-pay | Admitting: Cardiology

## 2019-07-28 DIAGNOSIS — C55 Malignant neoplasm of uterus, part unspecified: Secondary | ICD-10-CM

## 2019-08-15 ENCOUNTER — Other Ambulatory Visit (HOSPITAL_COMMUNITY)
Admission: RE | Admit: 2019-08-15 | Discharge: 2019-08-15 | Disposition: A | Discharge status: Home / Self Care | Payer: Medicare Other | Source: Ambulatory Visit | Attending: Critical Care Medicine | Admitting: Critical Care Medicine

## 2019-08-15 DIAGNOSIS — Z20822 Contact with and (suspected) exposure to covid-19: Secondary | ICD-10-CM | POA: Diagnosis not present

## 2019-08-15 DIAGNOSIS — Z01812 Encounter for preprocedural laboratory examination: Secondary | ICD-10-CM | POA: Insufficient documentation

## 2019-08-15 LAB — SARS CORONAVIRUS 2 (TAT 6-24 HRS): SARS Coronavirus 2: NEGATIVE

## 2019-08-18 ENCOUNTER — Other Ambulatory Visit: Payer: Self-pay

## 2019-08-18 ENCOUNTER — Ambulatory Visit (INDEPENDENT_AMBULATORY_CARE_PROVIDER_SITE_OTHER): Payer: Medicare Other | Admitting: Critical Care Medicine

## 2019-08-18 DIAGNOSIS — R0609 Other forms of dyspnea: Secondary | ICD-10-CM

## 2019-08-18 DIAGNOSIS — R0600 Dyspnea, unspecified: Secondary | ICD-10-CM | POA: Diagnosis not present

## 2019-08-18 LAB — PULMONARY FUNCTION TEST
DL/VA % pred: 129 %
DL/VA: 5.48 ml/min/mmHg/L
DLCO cor % pred: 106 %
DLCO cor: 17.79 ml/min/mmHg
DLCO unc % pred: 106 %
DLCO unc: 17.79 ml/min/mmHg
FEF 25-75 Post: 2.33 L/sec
FEF 25-75 Pre: 2.1 L/sec
FEF2575-%Change-Post: 10 %
FEF2575-%Pred-Post: 183 %
FEF2575-%Pred-Pre: 165 %
FEV1-%Change-Post: 2 %
FEV1-%Pred-Post: 103 %
FEV1-%Pred-Pre: 100 %
FEV1-Post: 1.43 L
FEV1-Pre: 1.39 L
FEV1FVC-%Change-Post: 2 %
FEV1FVC-%Pred-Pre: 114 %
FEV6-%Change-Post: 0 %
FEV6-%Pred-Post: 92 %
FEV6-%Pred-Pre: 92 %
FEV6-Post: 1.58 L
FEV6-Pre: 1.58 L
FEV6FVC-%Pred-Post: 105 %
FEV6FVC-%Pred-Pre: 105 %
FVC-%Change-Post: 0 %
FVC-%Pred-Post: 87 %
FVC-%Pred-Pre: 87 %
FVC-Post: 1.58 L
FVC-Pre: 1.58 L
Post FEV1/FVC ratio: 90 %
Post FEV6/FVC ratio: 100 %
Pre FEV1/FVC ratio: 87 %
Pre FEV6/FVC Ratio: 100 %
RV % pred: 85 %
RV: 1.77 L
TLC % pred: 80 %
TLC: 3.58 L

## 2019-08-18 NOTE — Progress Notes (Signed)
Full PFT performed today. °

## 2019-08-21 DIAGNOSIS — F251 Schizoaffective disorder, depressive type: Secondary | ICD-10-CM | POA: Diagnosis not present

## 2019-08-21 NOTE — Progress Notes (Signed)
Can you please let Elizabeth [name] know that her PFTs were normal?  Also she does not have scheduled follow-up. Thanks!  LPC

## 2019-08-21 NOTE — Progress Notes (Signed)
Can you please let Ms. Peckenpaugh know that her PFTs were normal? Also she doesn't have follow up scheduled. Thanks!  LPC

## 2019-08-28 ENCOUNTER — Other Ambulatory Visit: Payer: Self-pay

## 2019-08-28 ENCOUNTER — Ambulatory Visit: Payer: Medicare Other | Admitting: Cardiology

## 2019-08-28 ENCOUNTER — Encounter: Payer: Self-pay | Admitting: Cardiology

## 2019-08-28 VITALS — BP 122/78 | HR 66 | Resp 20 | Ht 61.0 in | Wt 231.0 lb

## 2019-08-28 DIAGNOSIS — N1831 Chronic kidney disease, stage 3a: Secondary | ICD-10-CM

## 2019-08-28 DIAGNOSIS — R0600 Dyspnea, unspecified: Secondary | ICD-10-CM

## 2019-08-28 DIAGNOSIS — I2721 Secondary pulmonary arterial hypertension: Secondary | ICD-10-CM

## 2019-08-28 DIAGNOSIS — R0609 Other forms of dyspnea: Secondary | ICD-10-CM

## 2019-08-28 DIAGNOSIS — I10 Essential (primary) hypertension: Secondary | ICD-10-CM

## 2019-08-28 NOTE — Progress Notes (Signed)
Primary Physician/Referring:  Benito Mccreedy, MD  Patient ID: Elizabeth [name], female    DOB: [birth year], 74 y.o.   MRN: [id number]  Chief Complaint  Patient presents with  . Follow-up    6 week  . Hypertension  . Shortness of Breath   HPI:    Elizabeth [name]  is a 74 y.o. female  with history of left breast cancer in 2011 s/p mastectomy and is now in remission, hypertension, prediabetes, uterine cancer SP hysterectomy on 12/17/2018, depression, schizophrenia, morbid obesity, chronic dyspnea on exertion and leg edema presents here for 71-monthoffice visit specifically to follow-up on dyspnea on exertion and also hypertension.  She is presently doing well, except for continued dyspnea on exertion and occasional wheezing, has no specific complaints.  Is tolerating all medications well.  States that she has been losing weight on a regular basis.  Patient is trying her best to eat at home and has noticed improvement in dyspnea. Denies chest pain or palpitations.    Past Medical History:  Diagnosis Date  . Anxiety   [place]    [place] ca [place]  . [place]  . Depression   . GERD (gastroesophageal reflux disease) 12/17/2012  . Hypertension   . Hypertension 12/17/2012  . Nausea alone 12/17/2012  . [facility]    Past Surgical History:  Procedure Laterality Date  . BREAST SURGERY    . HYSTEROTOMY  12/2018  . MASTECTOMY Left   . ROBOTIC ASSISTED TOTAL HYSTERECTOMY WITH BILATERAL SALPINGO OOPHERECTOMY Bilateral 12/17/2018   Procedure: XI ROBOTIC ASSISTED TOTAL HYSTERECTOMY WITH BILATERAL SALPINGO OOPHORECTOMY;  Surgeon: REveritt Amber MD;  Location: [facility];  Service: Gynecology;  Laterality: Bilateral;  . SENTINEL NODE BIOPSY N/A 12/17/2018   Procedure: SENTINEL NODE BIOPSY;  Surgeon: REveritt Amber MD;  Location: [facility];  Service: Gynecology;  Laterality: N/A;  . TONSILLECTOMY     Family History  Problem Relation Age of Onset  .  Lung cancer Mother   . Lung cancer Father   . Heart attack Sister   . Heart attack Brother     Social History   Tobacco Use  . Smoking status: Passive Smoke Exposure - Never Smoker  . Smokeless tobacco: Never Used  Substance Use Topics  . Alcohol use: No   Marital Status: Divorced   ROS  Review of Systems  Constitutional: Negative for weight gain.  Cardiovascular: Positive for dyspnea on exertion and leg swelling. Negative for syncope.  Respiratory: Negative for shortness of breath.   Musculoskeletal: Positive for joint pain (right knee). Negative for joint swelling.   Objective  Blood pressure 122/78, pulse 66, resp. rate 20, height '5\' 1"'$  (1.549 m), weight 231 lb (104.8 kg), SpO2 99 %.  Vitals with BMI 08/28/2019 07/16/2019 05/22/2019  Height '5\' 1"'$  '5\' 0"'$  5' 0.433"  Weight 231 lbs 227 lbs 226 lbs 3 oz  BMI 43.67 [phone] Systolic [id number] Diastolic 78 70 60  Pulse 66 70 68     Physical Exam Constitutional:      General: She is not in acute distress.    Comments: Moderately obese   Cardiovascular:     Rate and Rhythm: Normal rate and regular rhythm.     Pulses: Normal pulses and intact distal pulses.     Heart sounds: No murmur heard.  No gallop.      Comments: Trace edema  Pulmonary:     Effort: Pulmonary effort  is normal. No accessory muscle usage or respiratory distress.     Breath sounds: Normal breath sounds.  Abdominal:     General: Bowel sounds are normal.     Palpations: Abdomen is soft.     Comments: Obese and small pannus present    Laboratory examination:   Recent Labs    04/23/19 1611 05/30/19 1618 07/16/19 1624  NA 142 138 142  K 4.5 4.5 4.1  CL 108* 101 104  CO2 '21 24 24  '$ GLUCOSE 121* 129* 91  BUN 23 29* 20  CREATININE 1.19* 1.33* 1.29*  CALCIUM 9.6 10.1 9.6  GFRNONAA 45* 39* 41*  GFRAA 52* 45* 47*   CrCl cannot be calculated (Patient's most recent lab result is older than the maximum 21 days allowed.).  CMP Latest Ref Rng &  Units 07/16/2019 05/30/2019 04/23/2019  Glucose 65 - 99 mg/dL 91 129(H) 121(H)  BUN 8 - 27 mg/dL 20 29(H) 23  Creatinine 0.57 - 1.00 mg/dL 1.29(H) 1.33(H) 1.19(H)  Sodium 134 - 144 mmol/L 142 138 142  Potassium 3.5 - 5.2 mmol/L 4.1 4.5 4.5  Chloride 96 - 106 mmol/L 104 101 108(H)  CO2 20 - 29 mmol/L '24 24 21  '$ Calcium 8.7 - 10.3 mg/dL 9.6 10.1 9.6  Total Protein 6.5 - 8.1 g/dL - - -  Total Bilirubin 0.3 - 1.2 mg/dL - - -  Alkaline Phos 38 - 126 U/L - - -  AST 15 - 41 U/L - - -  ALT 0 - 44 U/L - - -   CBC Latest Ref Rng & Units 12/11/2018 03/18/2015 01/14/2015  WBC 4.0 - 10.5 K/uL 7.1 7.3 7.3  Hemoglobin 12.0 - 15.0 g/dL 12.9 11.8 11.7  Hematocrit 36 - 46 % 40.8 35.9 36.1  Platelets 150 - 400 K/uL 216 215 213   External labs:   Lipid Panel 11/18/2018:  HDL 57.000 mg LDL 69.000 mg  Cholesterol, total 143.000 mg Triglycerides 90.000 mg  A1C 6.100 %  TSH 1.960 UIU/ 10/02/2018  04/14/2019: ALT 13, AST 14, Alk Phosph 74. Total Bili 0.2.  05/30/2019: eGFR 45  Medications and allergies   Allergies  Allergen Reactions  . Amlodipine     Severe edema, SOB, weight gain, rash     Current Outpatient Medications  Medication Instructions  . ARIPiprazole (ABILIFY) 15 mg, Oral, Daily at bedtime  . benztropine (COGENTIN) 0.5 mg, Oral, Daily  . escitalopram (LEXAPRO) 20 mg, Oral, Daily at bedtime  . fluticasone (FLONASE) 50 MCG/ACT nasal spray No dose, route, or frequency recorded.  . furosemide (LASIX) 40 MG tablet TAKE ONE TABLET BY MOUTH DAILY  . labetalol (NORMODYNE) 200 mg, Oral, 2 times daily  . megestrol (MEGACE) 40 MG tablet TAKE ONE TABLET BY MOUTH DAILY  . senna-docusate (SENOKOT-S) 8.6-50 MG tablet 2 tablets, Oral, Daily at bedtime, For AFTER surgery, do not take if having diarrhea  . spironolactone (ALDACTONE) 25 mg, Oral, 2 times daily  . valsartan-hydrochlorothiazide (DIOVAN-HCT) 160-12.5 MG tablet TAKE ONE TABLET BY MOUTH DAILY  . VENTOLIN HFA 108 (90 Base) MCG/ACT inhaler  2 puffs, Inhalation, Every 6 hours PRN  . Vitamin D 5,000 Units, Oral, Daily   Radiology:   No results found.  Cardiac Studies:   Echocardiogram 02/13/2019:  Normal LV systolic function with EF 60%. Left ventricle cavity is normal in size. Mild concentric hypertrophy of the left ventricle. Normal global wall motion. Normal diastolic filling pattern. Calculated EF 60%. Structurally normal mitral valve.  Mild (Grade I) mitral regurgitation.  Structurally normal tricuspid valve.  Mild tricuspid regurgitation. Mild to moderate pulmonary hypertension. RVSP measures 41 mmHg. Compared to study done on 11/20/2018, PA pressure was measured at 54 mm Hg.   Lexiscan (Walking with mod Bruce)Tetrofosmin Stress Test  04/16/2019: Nondiagnostic ECG stress. There is a fixed moderate defect in the inferior and apical regions consistent with soft tissue attenuation. Overall LV systolic function is normal without regional wall motion abnormalities. Stress LV EF: 71%.  No previous exam available for comparison. Low risk study.   PFT 08/18/2019: Normal per pulmonary medicine.  EKG   EKG 08/28/2019: Normal sinus rhythm at rate of 61 bpm, normal axis, no evidence of ischemia.  Normal U wave present. No significant change from 03/11/2019.   Assessment     ICD-10-CM   1. Primary hypertension  I10 EKG 12-Lead    PCV ECHOCARDIOGRAM COMPLETE  2. Stage 3a chronic kidney disease  N18.31   3. Class 3 severe obesity due to excess calories without serious comorbidity with body mass index (BMI) of 40.0 to 44.9 in adult (HCC)  E66.01    Z68.41   4. 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 Dyspnea on exertion  R06.00 PCV ECHOCARDIOGRAM COMPLETE     No orders of the defined types were placed in this encounter.   There are no discontinued medications.  Recommendations:   Meriam Chojnowski  is a 74 y.o.female  with history of left breast cancer in 2011 s/p  mastectomy and is now in remission, hypertension, prediabetes, uterine cancer SP hysterectomy on 12/17/2018, depression, schizophrenia, morbid obesity, chronic dyspnea on exertion and leg edema presents here for 70-monthoffice visit.  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  I reviewed the notes from pulmonary medicine, PFTs have been normal.  I would like to repeat echocardiogram to reevaluate PA pressures and RV function in view of persistent dyspnea.  Today there is no clinical evidence of congestive heart failure, blood pressure is well controlled.  She has continued to lose weight since she established with me, I given her positive reinforcement.  I would like to see her back in 3 months for follow-up, specifically to follow-up on secondary pulmonary hypertension.  She is aware to contact me if her dyspnea gets worse.  I may consider right heart catheterization and left heart catheterization if symptoms persist.  She has normal lipids, other labs reviewed, she has stable chronic stage III disease of the kidney and since hysterectomy her hemoglobin is normalized.  JAdrian Prows MD, F[facility]6/24/2021, 3:05 PM PMorning SunCardiovascular. PA Pager: [phone] Office: [phone]

## 2019-09-05 ENCOUNTER — Other Ambulatory Visit: Payer: Self-pay

## 2019-09-05 ENCOUNTER — Ambulatory Visit: Payer: Medicare Other

## 2019-09-05 DIAGNOSIS — R0600 Dyspnea, unspecified: Secondary | ICD-10-CM

## 2019-09-05 DIAGNOSIS — R0609 Other forms of dyspnea: Secondary | ICD-10-CM

## 2019-09-05 DIAGNOSIS — I2721 Secondary pulmonary arterial hypertension: Secondary | ICD-10-CM

## 2019-09-05 DIAGNOSIS — I10 Essential (primary) hypertension: Secondary | ICD-10-CM

## 2019-09-08 NOTE — Progress Notes (Signed)
Normal LV systolic function, Grade II diastolic dysfun. Mopd PH

## 2019-09-09 ENCOUNTER — Other Ambulatory Visit: Payer: Self-pay | Admitting: Cardiology

## 2019-09-22 ENCOUNTER — Other Ambulatory Visit: Payer: Self-pay | Admitting: Gynecologic Oncology

## 2019-09-22 DIAGNOSIS — C55 Malignant neoplasm of uterus, part unspecified: Secondary | ICD-10-CM

## 2019-10-06 ENCOUNTER — Other Ambulatory Visit: Payer: Self-pay | Admitting: Cardiology

## 2019-10-06 ENCOUNTER — Other Ambulatory Visit: Payer: Self-pay | Admitting: Gynecologic Oncology

## 2019-10-06 DIAGNOSIS — C55 Malignant neoplasm of uterus, part unspecified: Secondary | ICD-10-CM

## 2019-10-06 DIAGNOSIS — I10 Essential (primary) hypertension: Secondary | ICD-10-CM

## 2019-10-07 ENCOUNTER — Other Ambulatory Visit: Payer: Self-pay

## 2019-10-07 ENCOUNTER — Inpatient Hospital Stay: Payer: Medicare Other | Attending: Gynecologic Oncology | Admitting: Gynecologic Oncology

## 2019-10-07 VITALS — BP 136/54 | HR 70 | Temp 98.6°F | Resp 18 | Ht 61.0 in | Wt 231.0 lb

## 2019-10-07 DIAGNOSIS — Z853 Personal history of malignant neoplasm of breast: Secondary | ICD-10-CM | POA: Diagnosis not present

## 2019-10-07 DIAGNOSIS — K219 Gastro-esophageal reflux disease without esophagitis: Secondary | ICD-10-CM | POA: Diagnosis not present

## 2019-10-07 DIAGNOSIS — Z90710 Acquired absence of both cervix and uterus: Secondary | ICD-10-CM | POA: Diagnosis not present

## 2019-10-07 DIAGNOSIS — C541 Malignant neoplasm of endometrium: Secondary | ICD-10-CM | POA: Diagnosis not present

## 2019-10-07 DIAGNOSIS — F209 Schizophrenia, unspecified: Secondary | ICD-10-CM | POA: Diagnosis not present

## 2019-10-07 DIAGNOSIS — N760 Acute vaginitis: Secondary | ICD-10-CM | POA: Insufficient documentation

## 2019-10-07 DIAGNOSIS — I110 Hypertensive heart disease with heart failure: Secondary | ICD-10-CM | POA: Insufficient documentation

## 2019-10-07 DIAGNOSIS — Z90722 Acquired absence of ovaries, bilateral: Secondary | ICD-10-CM | POA: Insufficient documentation

## 2019-10-07 DIAGNOSIS — Z6841 Body Mass Index (BMI) 40.0 and over, adult: Secondary | ICD-10-CM | POA: Insufficient documentation

## 2019-10-07 DIAGNOSIS — C55 Malignant neoplasm of uterus, part unspecified: Secondary | ICD-10-CM

## 2019-10-07 DIAGNOSIS — Z79899 Other long term (current) drug therapy: Secondary | ICD-10-CM | POA: Diagnosis not present

## 2019-10-07 MED ORDER — METRONIDAZOLE 500 MG PO TABS: 500.0000 mg | ORAL_TABLET | Freq: Three times a day (TID) | ORAL | 0 refills | Status: DC

## 2019-10-07 NOTE — Progress Notes (Signed)
Gynecologic Oncology Follow-up  Chief Complaint:  Chief Complaint  Patient presents with  . Endometrioid adenocarcinoma of uterus ([place])    Follow Up    Assessment/Plan:  [name]  is a 74 y.o.  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 MSI pending Pathology revealed low risk factors for recurrence, therefore no adjuvant therapy is recommended according to NCCN guidelines.  I discussed risk for recurrence and typical symptoms encouraged her to notify us of these should they develop between visits.  I recommend she have follow-up every 6 months for 5 years in accordance with NCCN guidelines. Those visits should include symptom assessment, physical exam and pelvic examination. Pap smears are not indicated or recommended in the routine surveillance of endometrial cancer.   Vaginitis - try flagyl x 1 week.   HPI: [name] is a 74 year old parous woman who was seen in consultation at the request of Dr. Lannette Donath for FIGO grade 1 endometrioid adenocarcinoma in the setting of morbid obesity, schizophrenia, and history of congestive heart failure.  The patient reports abnormal postmenopausal bleeding since July 2020.  This prompted her to be seen by Dr. Ulanda Edison who performed a Pap which was negative for cytologic abnormalities.  He also performed an endometrial biopsy on November 06, 2018 which revealed FIGO grade 1 endometrioid adenocarcinoma arising in the background of complex atypical hyperplasia.  Her history is significant for morbid obesity with a BMI of 44 kg/m.  She has schizophrenia but lives alone.  Her daughter lives in town.  She has a history of left stage IIa breast cancer with positive axillary node (microscopic) ER PR positive HER-2 negative.  This was diagnosed and treated in 2011.  She was recommended to receive adjuvant chemotherapy but declined.  Instead she received  mastectomy, and letrozole therapy postoperatively.  She is remained NED since that time.  Her medical history is also significant for history of congestive heart heart failure approximately 10 years ago.  She reports that Dr. Ezequiel Ganser is her cardiologist but has not seen him for many years and has not had a echocardiogram for many years.  She also has a history of hypertension.  She reports some shortness of breath on exertion but no chest pain.  She has a history of hypercholesterolemia.  She denies antiplatelet or anticoagulant use.  A transvaginal ultrasound had been performed on September 13, 2018 and revealed a uterus measuring 11.6 x 4.1 x 5.7 cm.  The endometrial thickness measured 18 mm.  There is a myometrial mass measuring a centimeter in the fundus likely a fibroid.  The right and left ovaries were grossly normal.  There was no free fluid.  Interval Hx:   On December 17, 2018 she underwent a robotic assisted total hysterectomy with BSO and sentinel lymph node biopsy.  Intraoperative findings were significant for a 12 cm fibroid uterus, significant retroperitoneal and intra-abdominal adiposity.  No suspicious extrauterine disease.  Final pathology returned as a FIGO grade 1 endometrioid adenocarcinoma with inner half myometrial invasion (10 of 22 mm).  The cervical stroma, adnexa, lymph nodes, and adnexa were negative for metastatic disease.  There was no lymphovascular space invasion.  She met low risk criteria for recurrence and therefore no adjuvant therapy was recommended in accordance with NCCN guidelines.  She returned today for routine surveillance and reported yellow vaginal discharge x 2 days. She denied bleeding or pruritis.   Current Meds:  Outpatient Encounter Medications as of 10/07/2019  Medication Sig  . ARIPiprazole (ABILIFY) 15 MG tablet Take 15 mg by mouth at bedtime.   . benztropine (COGENTIN) 0.5 MG tablet Take 0.5 mg by mouth daily.   . Cholecalciferol (VITAMIN D) 125 MCG  (5000 UT) CAPS Take 5,000 Units by mouth daily.  Marland Kitchen escitalopram (LEXAPRO) 20 MG tablet Take 20 mg by mouth at bedtime.   . furosemide (LASIX) 40 MG tablet TAKE ONE TABLET BY MOUTH DAILY  . labetalol (NORMODYNE) 100 MG tablet TAKE TWO TABLETS BY MOUTH TWICE A DAY  . lisinopril (ZESTRIL) 20 MG tablet   . megestrol (MEGACE) 40 MG tablet TAKE ONE TABLET BY MOUTH DAILY  . rosuvastatin (CRESTOR) 20 MG tablet   . spironolactone (ALDACTONE) 25 MG tablet Take 25 mg by mouth 2 (two) times daily.   . valsartan-hydrochlorothiazide (DIOVAN-HCT) 160-12.5 MG tablet TAKE ONE TABLET BY MOUTH DAILY  . VENTOLIN HFA 108 (90 Base) MCG/ACT inhaler Inhale 2 puffs into the lungs every 6 (six) hours as needed for wheezing or shortness of breath.   . metroNIDAZOLE (FLAGYL) 500 MG tablet Take 1 tablet (500 mg total) by mouth 3 (three) times daily.  . [DISCONTINUED] fluticasone (FLONASE) 50 MCG/ACT nasal spray  (Patient not taking: Reported on 10/07/2019)  . [DISCONTINUED] labetalol (NORMODYNE) 100 MG tablet Take 2 tablets (200 mg total) by mouth 2 (two) times daily.  . [DISCONTINUED] senna-docusate (SENOKOT-S) 8.6-50 MG tablet Take 2 tablets by mouth at bedtime. For AFTER surgery, do not take if having diarrhea (Patient not taking: Reported on 08/28/2019)  . [DISCONTINUED] valsartan-hydrochlorothiazide (DIOVAN-HCT) 160-12.5 MG tablet TAKE ONE TABLET BY MOUTH DAILY   No facility-administered encounter medications on file as of 10/07/2019.    Allergy:  Allergies  Allergen Reactions  . Amlodipine     Severe edema, SOB, weight gain, rash    Social Hx:   Social History   Socioeconomic History  . Marital status: Divorced    Spouse name: Not on file  . Number of children: 2  . Years of education: Not on file  . Highest education level: Not on file  Occupational History  . Not on file  Tobacco Use  . Smoking status: Passive Smoke Exposure - Never Smoker  . Smokeless tobacco: Never Used  Vaping Use  . Vaping Use:  Never used  Substance and Sexual Activity  . Alcohol use: No  . Drug use: No  . Sexual activity: Not Currently  Other Topics Concern  . Not on file  Social History Narrative  . Not on file   Social Determinants of Health   Financial Resource Strain:   . Difficulty of Paying Living Expenses:   Food Insecurity:   . Worried About Charity fundraiser in the Last Year:   . Arboriculturist in the Last Year:   Transportation Needs:   . Film/video editor (Medical):   Marland Kitchen Lack of Transportation (Non-Medical):   Physical Activity:   . Days of Exercise per Week:   . Minutes of Exercise per Session:   Stress:   . Feeling of Stress :   Social Connections:   . Frequency of Communication with Friends and Family:   . Frequency of Social Gatherings with Friends and Family:   . Attends Religious Services:   . Active Member of Clubs or Organizations:   . Attends Archivist Meetings:   Marland Kitchen Marital Status:   Intimate Partner Violence:   . Fear of Current  or Ex-Partner:   . Emotionally Abused:   Marland Kitchen Physically Abused:   . Sexually Abused:     Past Surgical Hx:  Past Surgical History:  Procedure Laterality Date  . BREAST SURGERY    . HYSTEROTOMY  12/2018  . MASTECTOMY Left   . ROBOTIC ASSISTED TOTAL HYSTERECTOMY WITH BILATERAL SALPINGO OOPHERECTOMY Bilateral 12/17/2018   Procedure: XI ROBOTIC ASSISTED TOTAL HYSTERECTOMY WITH BILATERAL SALPINGO OOPHORECTOMY;  Surgeon: Everitt Amber, MD;  Location: [facility];  Service: Gynecology;  Laterality: Bilateral;  . SENTINEL NODE BIOPSY N/A 12/17/2018   Procedure: SENTINEL NODE BIOPSY;  Surgeon: Everitt Amber, MD;  Location: [facility];  Service: Gynecology;  Laterality: N/A;  . TONSILLECTOMY      Past Medical Hx:  Past Medical History:  Diagnosis Date  . Anxiety   . Breast CA ([place])    (Lt) breast ca dx 02/2010  . Breast cancer ([place]) 03/31/2011  . Depression   . GERD (gastroesophageal reflux disease) 12/17/2012  . Hypertension   .  Hypertension 12/17/2012  . Nausea alone 12/17/2012  . Schizophrenia [facility])     Past Gynecological History:   No LMP recorded. Patient is postmenopausal.  Family Hx:  Family History  Problem Relation Age of Onset  . Lung cancer Mother   . Lung cancer Father   . Heart attack Sister   . Heart attack Brother     Review of Systems:  Constitutional  Feels well,   ENT Normal appearing ears and nares bilaterally Skin/Breast  No rash, sores, jaundice, itching, dryness Cardiovascular  No chest pain, shortness of breath, or edema  Pulmonary  No cough or wheeze.  Gastro Intestinal  No nausea, vomitting, or diarrhoea. No bright red blood per rectum, no abdominal pain, change in bowel movement, or constipation.  Genito Urinary  No frequency, urgency, dysuria, no bleeding, + discharge  Musculo Skeletal  No myalgia, arthralgia, joint swelling or pain  Neurologic  No weakness, numbness, change in gait,  Psychology  No depression, anxiety, insomnia. + schizophrenia  Vitals:  Blood pressure (!) 136/54, pulse 70, temperature 98.6 F (37 C), temperature source Oral, resp. rate 18, height _0  (1.549 m), weight 231 lb (104.8 kg), SpO2 100 %.  Physical Exam: WD in NAD Neck  Supple NROM, without any enlargements.  Lymph Node Survey No cervical supraclavicular or inguinal adenopathy Cardiovascular  Pulse normal rate, regularity and rhythm. S1 and S2 normal.  Lungs  Clear to auscultation bilateraly, without wheezes/crackles/rhonchi. Good air movement.  Skin  No rash/lesions/breakdown  Psychiatry  Alert and oriented to person, place, and time  Abdomen  Normoactive bowel sounds, abdomen soft, non-tender and obese without evidence of hernia. Well healed incisiosn Back No CVA tenderness Genito Urinary  Vulva/vagina: Normal external female genitalia.  No lesions. + vaginal discharge -thin and yellow.   Bladder/urethra:  No lesions or masses, well supported bladder  Vagina: well healed  vaginal cuff, no bleeding or masses or lesions.  Rectal  deferred Extremities  No bilateral cyanosis, clubbing or edema.  Thereasa Solo, MD  10/07/2019, 2:54 PM

## 2019-10-07 NOTE — Patient Instructions (Signed)
Please notify Dr Denman George at phone number [phone] if you notice vaginal bleeding, new pelvic or abdominal pains, bloating, feeling full easy, or a change in bladder or bowel function.   Please contact Dr Serita Grit office (at [phone]) in October to request an appointment with her for February, 2022.  Please take the flagyl three times a day for 1 week for your vaginal discharge.

## 2019-11-17 ENCOUNTER — Other Ambulatory Visit: Payer: Self-pay | Admitting: Gynecologic Oncology

## 2019-11-17 ENCOUNTER — Other Ambulatory Visit: Payer: Self-pay | Admitting: Cardiology

## 2019-11-17 DIAGNOSIS — C55 Malignant neoplasm of uterus, part unspecified: Secondary | ICD-10-CM

## 2019-11-27 ENCOUNTER — Other Ambulatory Visit: Payer: Self-pay

## 2019-11-27 ENCOUNTER — Encounter: Payer: Self-pay | Admitting: Cardiology

## 2019-11-27 ENCOUNTER — Ambulatory Visit: Payer: Medicare Other | Admitting: Cardiology

## 2019-11-27 VITALS — BP 127/52 | HR 59 | Resp 16 | Ht 61.0 in | Wt 234.0 lb

## 2019-11-27 DIAGNOSIS — R0989 Other specified symptoms and signs involving the circulatory and respiratory systems: Secondary | ICD-10-CM

## 2019-11-27 DIAGNOSIS — I5032 Chronic diastolic (congestive) heart failure: Secondary | ICD-10-CM | POA: Diagnosis not present

## 2019-11-27 DIAGNOSIS — I10 Essential (primary) hypertension: Secondary | ICD-10-CM | POA: Diagnosis not present

## 2019-11-27 DIAGNOSIS — Z6841 Body Mass Index (BMI) 40.0 and over, adult: Secondary | ICD-10-CM

## 2019-11-27 DIAGNOSIS — N1831 Chronic kidney disease, stage 3a: Secondary | ICD-10-CM

## 2019-11-27 DIAGNOSIS — E66813 Obesity, class 3: Secondary | ICD-10-CM

## 2019-11-27 NOTE — Progress Notes (Signed)
Primary Physician/Referring:  Benito Mccreedy, MD  Patient ID: Elizabeth [name], female    DOB: 1945/10/31, 74 y.o.   MRN: [id number]  Chief Complaint  Patient presents with  . Follow-up    3 month  . Hypertension  . Pulmonary Hypertension   HPI:    Elizabeth [name]  is a 75 y.o. female  with history of left breast cancer in 2011 s/p mastectomy and is now in remission, hypertension, prediabetes, uterine cancer SP hysterectomy on 12/17/2018, depression, schizophrenia, morbid obesity, chronic dyspnea on exertion and leg edema presents here for 64-month office visit specifically to follow-up on dyspnea on exertion and also hypertension.  She is presently doing well, except for continued dyspnea on exertion and occasional wheezing, has no specific complaints. Patient is trying her best to eat at home and has noticed improvement in dyspnea, however last week was her birthday and she has gained 3-4 Lbs in weight. Denies chest pain or palpitations.    Past Medical History:  Diagnosis Date  . Anxiety   . Breast CA ([place])    (Lt) breast ca dx 02/2010  . Breast cancer ([place]) 03/31/2011  . Depression   . GERD (gastroesophageal reflux disease) 12/17/2012  . Hypertension   . Hypertension 12/17/2012  . Nausea alone 12/17/2012  . Schizophrenia [facility])    Past Surgical History:  Procedure Laterality Date  . BREAST SURGERY    . HYSTEROTOMY  12/2018  . MASTECTOMY Left   . ROBOTIC ASSISTED TOTAL HYSTERECTOMY WITH BILATERAL SALPINGO OOPHERECTOMY Bilateral 12/17/2018   Procedure: XI ROBOTIC ASSISTED TOTAL HYSTERECTOMY WITH BILATERAL SALPINGO OOPHORECTOMY;  Surgeon: Everitt Amber, MD;  Location: [facility];  Service: Gynecology;  Laterality: Bilateral;  . SENTINEL NODE BIOPSY N/A 12/17/2018   Procedure: SENTINEL NODE BIOPSY;  Surgeon: Everitt Amber, MD;  Location: [facility];  Service: Gynecology;  Laterality: N/A;  . TONSILLECTOMY     Family History  Problem Relation Age of Onset  . Lung cancer Mother    . Lung cancer Father   . Heart attack Sister   . Heart attack Brother     Social History   Tobacco Use  . Smoking status: Passive Smoke Exposure - Never Smoker  . Smokeless tobacco: Never Used  Substance Use Topics  . Alcohol use: No   Marital Status: Divorced   ROS  Review of Systems  Constitutional: Negative for weight gain.  Cardiovascular: Positive for dyspnea on exertion and leg swelling. Negative for syncope.  Respiratory: Negative for shortness of breath.   Musculoskeletal: Positive for joint pain (right knee). Negative for joint swelling.   Objective  Blood pressure (!) 127/52, pulse (!) 59, resp. rate 16, height $RemoveBe'5\' 1"'ACUTqnEoe$  (1.549 m), weight 234 lb (106.1 kg), SpO2 94 %.  Vitals with BMI 11/27/2019 10/07/2019 08/28/2019  Height $Remov'5\' 1"'STyoAL$  $Remove'5\' 1"'sEGyMPA$  $RemoveB'5\' 1"'EgBdAOus$   Weight 234 lbs 231 lbs 231 lbs  BMI 44.24 61.95 09.32  Systolic 671 245 809  Diastolic 52 54 78  Pulse 59 70 66     Physical Exam Constitutional:      General: She is not in acute distress.    Comments: Moderately obese   Cardiovascular:     Rate and Rhythm: Normal rate and regular rhythm.     Pulses: Normal pulses and intact distal pulses.          Carotid pulses are on the right side with bruit and on the left side with bruit.    Heart sounds: No murmur heard.  No  gallop.      Comments: Trace edema  Pulmonary:     Effort: Pulmonary effort is normal. No accessory muscle usage or respiratory distress.     Breath sounds: Normal breath sounds.  Abdominal:     General: Bowel sounds are normal.     Palpations: Abdomen is soft.     Comments: Obese and small pannus present    Laboratory examination:   Recent Labs    04/23/19 1611 05/30/19 1618 07/16/19 1624  NA 142 138 142  K 4.5 4.5 4.1  CL 108* 101 104  CO2 $Re'21 24 24  'slP$ GLUCOSE 121* 129* 91  BUN 23 29* 20  CREATININE 1.19* 1.33* 1.29*  CALCIUM 9.6 10.1 9.6  GFRNONAA 45* 39* 41*  GFRAA 52* 45* 47*   CrCl cannot be calculated (Patient's most recent lab result is  older than the maximum 21 days allowed.).  CMP Latest Ref Rng & Units 07/16/2019 05/30/2019 04/23/2019  Glucose 65 - 99 mg/dL 91 129(H) 121(H)  BUN 8 - 27 mg/dL 20 29(H) 23  Creatinine 0.57 - 1.00 mg/dL 1.29(H) 1.33(H) 1.19(H)  Sodium 134 - 144 mmol/L 142 138 142  Potassium 3.5 - 5.2 mmol/L 4.1 4.5 4.5  Chloride 96 - 106 mmol/L 104 101 108(H)  CO2 20 - 29 mmol/L $RemoveB'24 24 21  'UMzYiJgO$ Calcium 8.7 - 10.3 mg/dL 9.6 10.1 9.6  Total Protein 6.5 - 8.1 g/dL - - -  Total Bilirubin 0.3 - 1.2 mg/dL - - -  Alkaline Phos 38 - 126 U/L - - -  AST 15 - 41 U/L - - -  ALT 0 - 44 U/L - - -   CBC Latest Ref Rng & Units 12/11/2018 03/18/2015 01/14/2015  WBC 4.0 - 10.5 K/uL 7.1 7.3 7.3  Hemoglobin 12.0 - 15.0 g/dL 12.9 11.8 11.7  Hematocrit 36 - 46 % 40.8 35.9 36.1  Platelets 150 - 400 K/uL 216 215 213   External labs:   Lipid Panel 11/18/2018:  HDL 57.000 mg LDL 69.000 mg  Cholesterol, total 143.000 mg Triglycerides 90.000 mg  A1C 6.100 %  TSH 1.960 UIU/ 10/02/2018  04/14/2019: ALT 13, AST 14, Alk Phosph 74. Total Bili 0.2.  05/30/2019: eGFR 45  Medications and allergies   Allergies  Allergen Reactions  . Amlodipine     Severe edema, SOB, weight gain, rash    Current Outpatient Medications on File Prior to Visit  Medication Sig Dispense Refill  . ARIPiprazole (ABILIFY) 15 MG tablet Take 15 mg by mouth at bedtime.     . benztropine (COGENTIN) 0.5 MG tablet Take 0.5 mg by mouth daily.     . Cholecalciferol (VITAMIN D) 125 MCG (5000 UT) CAPS Take 5,000 Units by mouth daily.    Marland Kitchen escitalopram (LEXAPRO) 20 MG tablet Take 20 mg by mouth at bedtime.     . furosemide (LASIX) 40 MG tablet TAKE ONE TABLET BY MOUTH DAILY (Patient taking differently: 20 mg. ) 30 tablet 0  . labetalol (NORMODYNE) 100 MG tablet TAKE TWO TABLETS BY MOUTH TWICE A DAY 120 tablet 1  . lisinopril (ZESTRIL) 20 MG tablet     . megestrol (MEGACE) 40 MG tablet TAKE ONE TABLET BY MOUTH DAILY 30 tablet 0  . rosuvastatin (CRESTOR) 20 MG  tablet     . spironolactone (ALDACTONE) 25 MG tablet Take 25 mg by mouth 2 (two) times daily.     . valsartan-hydrochlorothiazide (DIOVAN-HCT) 160-12.5 MG tablet TAKE ONE TABLET BY MOUTH DAILY 30 tablet 0  .  VENTOLIN HFA 108 (90 Base) MCG/ACT inhaler Inhale 2 puffs into the lungs every 6 (six) hours as needed for wheezing or shortness of breath.     . vitamin B-12 (CYANOCOBALAMIN) 1000 MCG tablet Take 1,000 mcg by mouth daily.    . metroNIDAZOLE (FLAGYL) 500 MG tablet Take 1 tablet (500 mg total) by mouth 3 (three) times daily. (Patient not taking: Reported on 11/27/2019) 21 tablet 0   No current facility-administered medications on file prior to visit.    Radiology:   No results found.  Cardiac Studies:   Lexiscan (Walking with mod Bruce)Tetrofosmin Stress Test  04/16/2019: Nondiagnostic ECG stress. There is a fixed moderate defect in the inferior and apical regions consistent with soft tissue attenuation. Overall LV systolic function is normal without regional wall motion abnormalities. Stress LV EF: 71%.  No previous exam available for comparison. Low risk study.   PFT 08/18/2019: Normal per pulmonary medicine.  Echocardiogram 09/05/2019: Normal LV systolic function with EF 64%. Left ventricle cavity is normal in size. Mild concentric hypertrophy of the left ventricle. Normal global wall motion. Doppler evidence of grade II (pseudonormal) diastolic dysfunction. Diastolic dysfunction findings suggests elevated LA/LV end diastolic pressure. Calculated EF 64%. Left atrial cavity is moderately dilated in apical views.  Structurally normal mitral valve.  Mild systolic anterior motion of the MV chordae with resultant mild to moderate (Grade II) mitral regurgitation. Structurally normal tricuspid valve.  Moderate tricuspid regurgitation. Moderate pulmonary hypertension. RVSP measures 47 mmHg. No significant change from 02/13/2019.   EKG    EKG 08/28/2019: Normal sinus rhythm at rate of 61  bpm, normal axis, no evidence of ischemia.  Normal U wave present. No significant change from 03/11/2019.   Assessment     ICD-10-CM   1. Chronic diastolic heart failure (HCC)  I50.32   2. Primary hypertension  I10   3. Stage 3a chronic kidney disease  N18.31   4. Class 3 severe obesity due to excess calories without serious comorbidity with body mass index (BMI) of 40.0 to 44.9 in adult (HCC)  E66.01    Z68.41   5. Bilateral carotid bruits  R09.89 PCV CAROTID DUPLEX (BILATERAL)    No orders of the defined types were placed in this encounter.   There are no discontinued medications.  Recommendations:   Elizabeth [name]  is a 74 y.o.female  with history of left breast cancer in 2011 s/p mastectomy and is now in remission, hypertension, prediabetes, uterine cancer SP hysterectomy on 12/17/2018, depression, schizophrenia, morbid obesity, chronic dyspnea on exertion and leg edema presents here for 66-month office visit.  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   I reviewed the results of the echocardiogram, she has grade 2 diastolic dysfunction related to her obesity and also longstanding hypertension and age.  She has been trying her best to lose weight, has gained about 3 to 4 pounds in weight since her recent birthday 2 weeks ago.  She will try to get the weight off.  She will continue to exercise on a regular basis and she has been trying to walk for at least 15 minutes a day.  Blood pressure is well controlled, lipids are well controlled.  I do hear bilateral carotid artery bruit that I had missed previously.  Will obtain carotid artery duplex.  Otherwise stable from cardiac standpoint, I would like to see her  back in 6 months for follow-up of chronic diastolic heart failure.  Renal function has remained stable and we discussed  regarding hypertension related stage III chronic kidney disease as well.   Adrian Prows, MD, [facility] 11/27/2019, 2:57 PM Office: [phone]

## 2019-12-03 ENCOUNTER — Ambulatory Visit: Payer: Medicare Other

## 2019-12-03 ENCOUNTER — Other Ambulatory Visit: Payer: Self-pay

## 2019-12-03 DIAGNOSIS — R0989 Other specified symptoms and signs involving the circulatory and respiratory systems: Secondary | ICD-10-CM

## 2019-12-15 ENCOUNTER — Other Ambulatory Visit: Payer: Self-pay | Admitting: Cardiology

## 2019-12-15 DIAGNOSIS — I10 Essential (primary) hypertension: Secondary | ICD-10-CM

## 2019-12-20 DIAGNOSIS — Z23 Encounter for immunization: Secondary | ICD-10-CM | POA: Diagnosis not present

## 2019-12-25 DIAGNOSIS — I10 Essential (primary) hypertension: Secondary | ICD-10-CM | POA: Diagnosis not present

## 2019-12-25 DIAGNOSIS — N182 Chronic kidney disease, stage 2 (mild): Secondary | ICD-10-CM | POA: Diagnosis not present

## 2019-12-25 DIAGNOSIS — N3000 Acute cystitis without hematuria: Secondary | ICD-10-CM | POA: Diagnosis not present

## 2019-12-25 DIAGNOSIS — E782 Mixed hyperlipidemia: Secondary | ICD-10-CM | POA: Diagnosis not present

## 2019-12-25 DIAGNOSIS — I5032 Chronic diastolic (congestive) heart failure: Secondary | ICD-10-CM | POA: Diagnosis not present

## 2019-12-25 DIAGNOSIS — R7303 Prediabetes: Secondary | ICD-10-CM | POA: Diagnosis not present

## 2019-12-25 DIAGNOSIS — E559 Vitamin D deficiency, unspecified: Secondary | ICD-10-CM | POA: Diagnosis not present

## 2020-01-19 ENCOUNTER — Other Ambulatory Visit: Payer: Self-pay | Admitting: Cardiology

## 2020-01-19 DIAGNOSIS — I10 Essential (primary) hypertension: Secondary | ICD-10-CM

## 2020-02-16 DIAGNOSIS — F251 Schizoaffective disorder, depressive type: Secondary | ICD-10-CM | POA: Diagnosis not present

## 2020-02-20 DIAGNOSIS — N178 Other acute kidney failure: Secondary | ICD-10-CM | POA: Diagnosis not present

## 2020-02-20 DIAGNOSIS — E782 Mixed hyperlipidemia: Secondary | ICD-10-CM | POA: Diagnosis not present

## 2020-02-20 DIAGNOSIS — R7303 Prediabetes: Secondary | ICD-10-CM | POA: Diagnosis not present

## 2020-02-20 DIAGNOSIS — I5032 Chronic diastolic (congestive) heart failure: Secondary | ICD-10-CM | POA: Diagnosis not present

## 2020-02-20 DIAGNOSIS — E559 Vitamin D deficiency, unspecified: Secondary | ICD-10-CM | POA: Diagnosis not present

## 2020-02-20 DIAGNOSIS — I10 Essential (primary) hypertension: Secondary | ICD-10-CM | POA: Diagnosis not present

## 2020-02-20 DIAGNOSIS — N182 Chronic kidney disease, stage 2 (mild): Secondary | ICD-10-CM | POA: Diagnosis not present

## 2020-03-29 DIAGNOSIS — F251 Schizoaffective disorder, depressive type: Secondary | ICD-10-CM | POA: Diagnosis not present

## 2020-03-29 DIAGNOSIS — G251 Drug-induced tremor: Secondary | ICD-10-CM | POA: Diagnosis not present

## 2020-04-05 DIAGNOSIS — E785 Hyperlipidemia, unspecified: Secondary | ICD-10-CM | POA: Diagnosis not present

## 2020-04-05 DIAGNOSIS — I129 Hypertensive chronic kidney disease with stage 1 through stage 4 chronic kidney disease, or unspecified chronic kidney disease: Secondary | ICD-10-CM | POA: Diagnosis not present

## 2020-04-05 DIAGNOSIS — N183 Chronic kidney disease, stage 3 unspecified: Secondary | ICD-10-CM | POA: Diagnosis not present

## 2020-04-05 DIAGNOSIS — I5032 Chronic diastolic (congestive) heart failure: Secondary | ICD-10-CM | POA: Diagnosis not present

## 2020-04-05 DIAGNOSIS — R7303 Prediabetes: Secondary | ICD-10-CM | POA: Diagnosis not present

## 2020-04-06 IMAGING — MG MM DIGITAL SCREENING UNILAT*R* W/ TOMO W/ CAD
6 series · 6 of 18 positions shown · non-contrast
Comparison: Previous exam(s).

CLINICAL DATA: Screening.

EXAM:
DIGITAL SCREENING UNILATERAL RIGHT MAMMOGRAM WITH CAD AND TOMO

[R CC synth-2D]
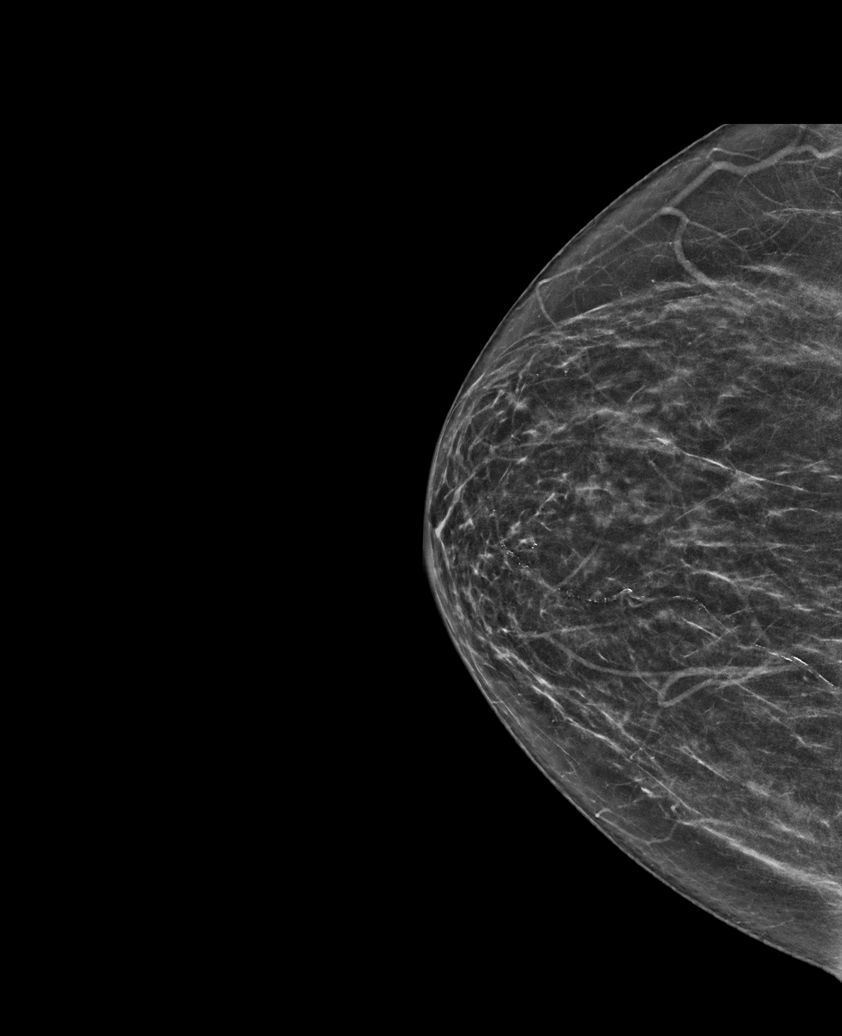

[R MLO synth-2D (1 of 2)]
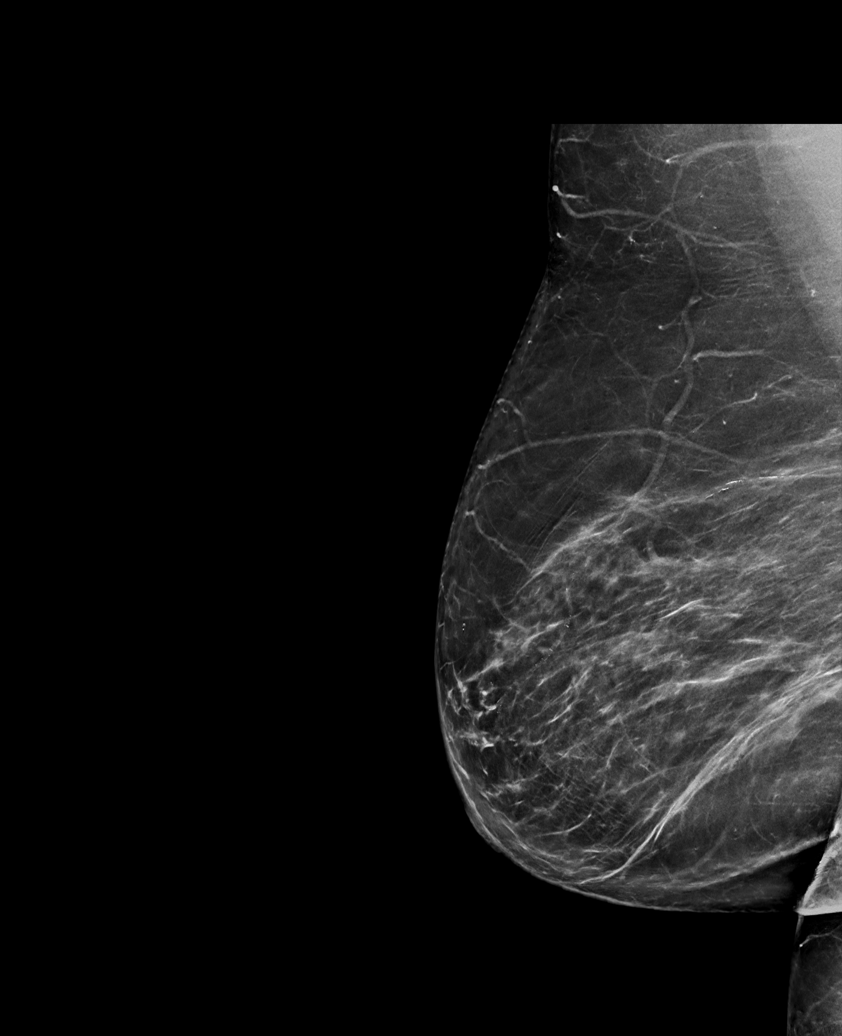

[R MLO synth-2D (2 of 2)]
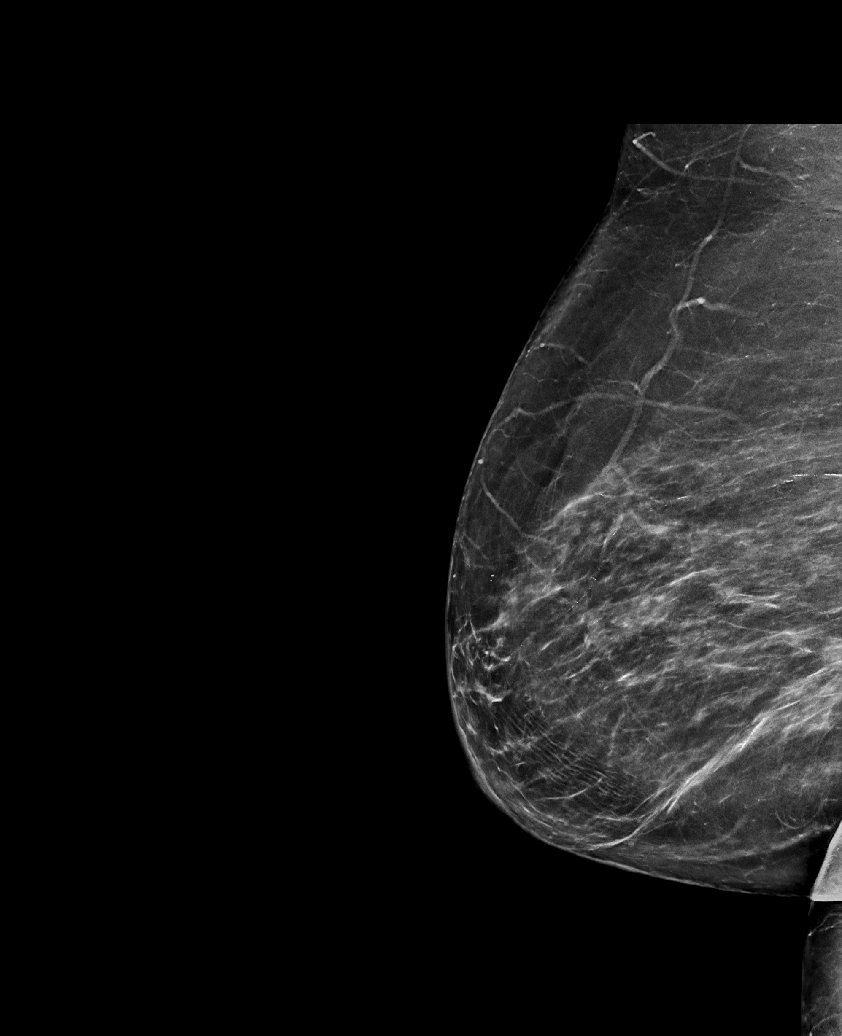

[R MLO tomo (1 of 2) · tomo slice 38/75.0]
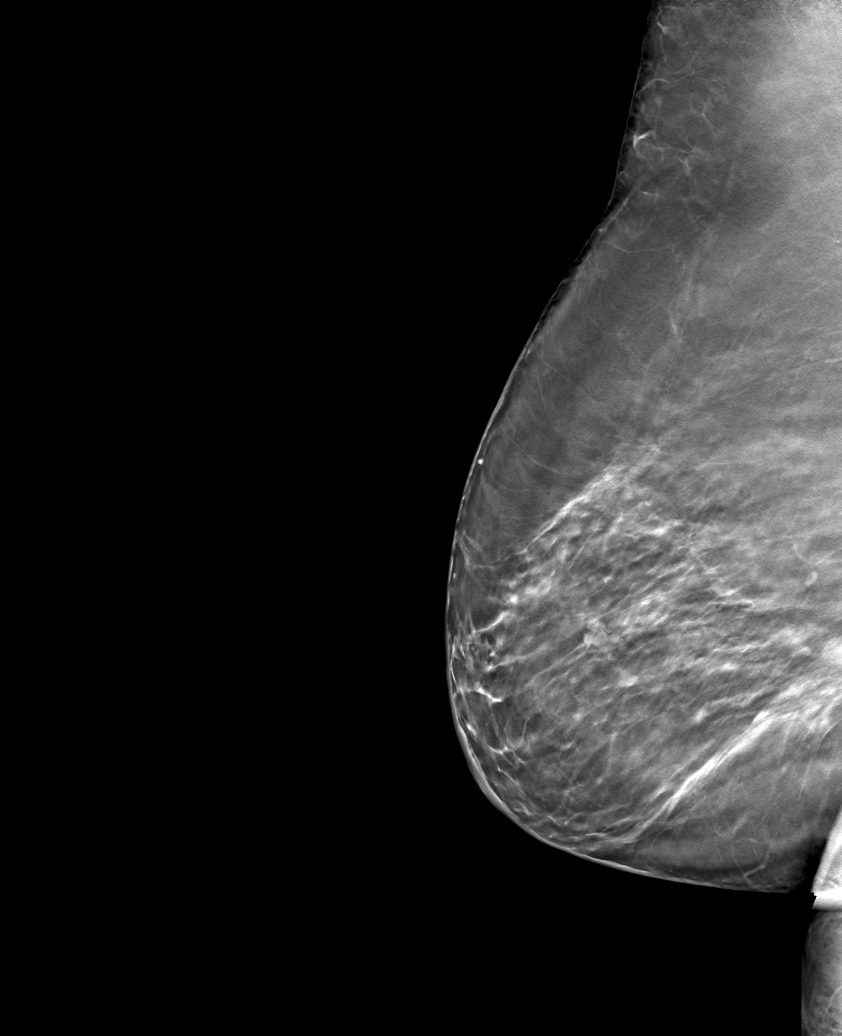

[R CC tomo · tomo slice 32/63.0]
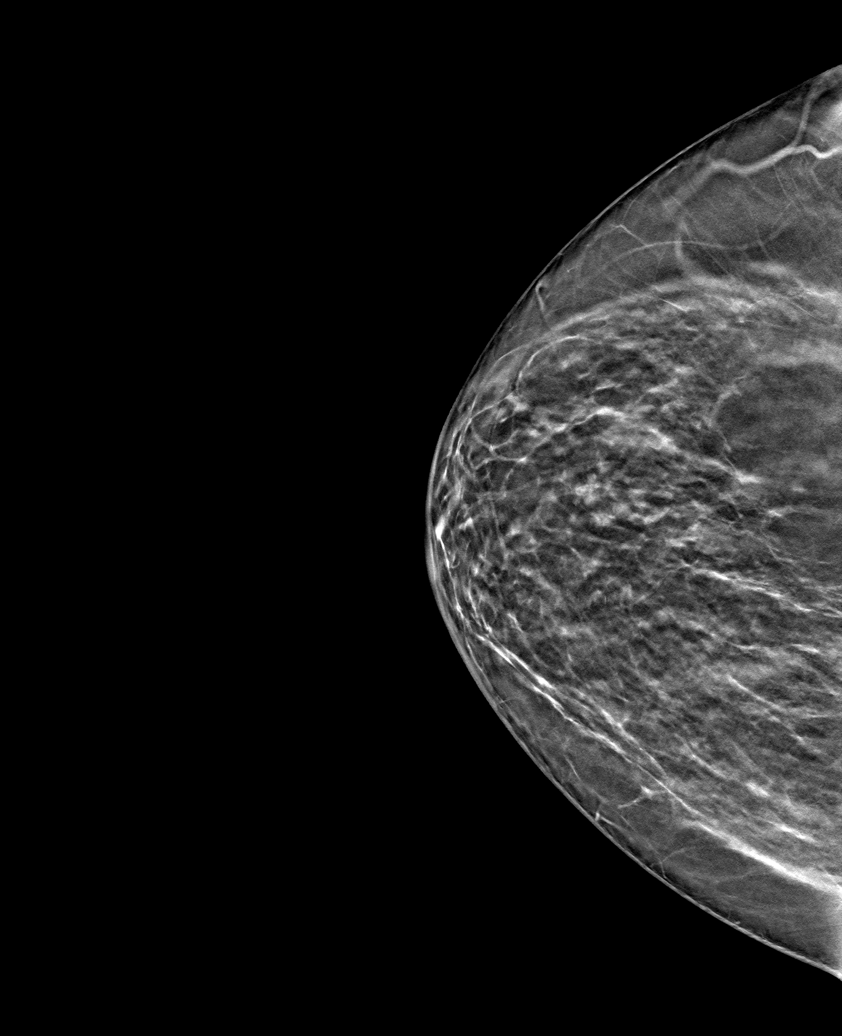

[R MLO tomo (2 of 2) · tomo slice 42/83.0]
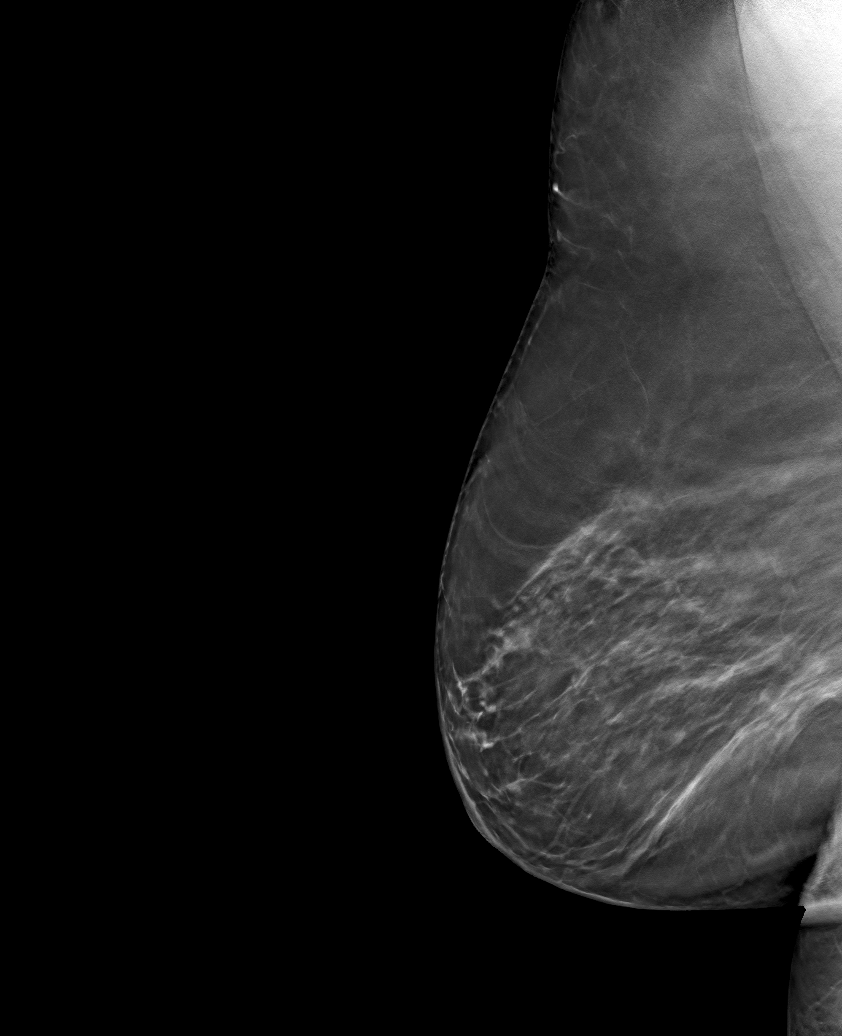

[6 of 18 positions shown; findings below may reference images not displayed]

ACR Breast Density Category b: There are scattered areas of
fibroglandular density.
FINDINGS: The patient has had a left mastectomy. There are no findings
suspicious for malignancy. Images were processed with CAD.
IMPRESSION: No mammographic evidence of malignancy. A result letter of this
screening mammogram will be mailed directly to the patient.

RECOMMENDATION:
Screening mammogram in one year.  (Code:H3-9-9K3)

BI-RADS CATEGORY  1: Negative.

## 2020-04-12 DIAGNOSIS — U071 COVID-19: Secondary | ICD-10-CM | POA: Diagnosis not present

## 2020-04-12 DIAGNOSIS — R062 Wheezing: Secondary | ICD-10-CM | POA: Diagnosis not present

## 2020-04-12 DIAGNOSIS — R197 Diarrhea, unspecified: Secondary | ICD-10-CM | POA: Diagnosis not present

## 2020-04-12 DIAGNOSIS — R059 Cough, unspecified: Secondary | ICD-10-CM | POA: Diagnosis not present

## 2020-04-29 DIAGNOSIS — N182 Chronic kidney disease, stage 2 (mild): Secondary | ICD-10-CM | POA: Diagnosis not present

## 2020-04-29 DIAGNOSIS — M25561 Pain in right knee: Secondary | ICD-10-CM | POA: Diagnosis not present

## 2020-04-29 DIAGNOSIS — R059 Cough, unspecified: Secondary | ICD-10-CM | POA: Diagnosis not present

## 2020-04-29 DIAGNOSIS — R7303 Prediabetes: Secondary | ICD-10-CM | POA: Diagnosis not present

## 2020-04-29 DIAGNOSIS — I5032 Chronic diastolic (congestive) heart failure: Secondary | ICD-10-CM | POA: Diagnosis not present

## 2020-04-29 DIAGNOSIS — I10 Essential (primary) hypertension: Secondary | ICD-10-CM | POA: Diagnosis not present

## 2020-04-29 DIAGNOSIS — E559 Vitamin D deficiency, unspecified: Secondary | ICD-10-CM | POA: Diagnosis not present

## 2020-04-29 DIAGNOSIS — U071 COVID-19: Secondary | ICD-10-CM | POA: Diagnosis not present

## 2020-04-29 DIAGNOSIS — E782 Mixed hyperlipidemia: Secondary | ICD-10-CM | POA: Diagnosis not present

## 2020-05-24 DIAGNOSIS — G629 Polyneuropathy, unspecified: Secondary | ICD-10-CM | POA: Diagnosis not present

## 2020-05-24 DIAGNOSIS — E559 Vitamin D deficiency, unspecified: Secondary | ICD-10-CM | POA: Diagnosis not present

## 2020-05-24 DIAGNOSIS — N1831 Chronic kidney disease, stage 3a: Secondary | ICD-10-CM | POA: Diagnosis not present

## 2020-05-24 DIAGNOSIS — I10 Essential (primary) hypertension: Secondary | ICD-10-CM | POA: Diagnosis not present

## 2020-05-24 DIAGNOSIS — J302 Other seasonal allergic rhinitis: Secondary | ICD-10-CM | POA: Diagnosis not present

## 2020-05-24 DIAGNOSIS — I5032 Chronic diastolic (congestive) heart failure: Secondary | ICD-10-CM | POA: Diagnosis not present

## 2020-05-24 DIAGNOSIS — E782 Mixed hyperlipidemia: Secondary | ICD-10-CM | POA: Diagnosis not present

## 2020-05-24 DIAGNOSIS — Z0001 Encounter for general adult medical examination with abnormal findings: Secondary | ICD-10-CM | POA: Diagnosis not present

## 2020-05-24 DIAGNOSIS — R7303 Prediabetes: Secondary | ICD-10-CM | POA: Diagnosis not present

## 2020-05-25 ENCOUNTER — Other Ambulatory Visit: Payer: Self-pay | Admitting: Nephrology

## 2020-05-25 DIAGNOSIS — N183 Chronic kidney disease, stage 3 unspecified: Secondary | ICD-10-CM

## 2020-05-26 ENCOUNTER — Ambulatory Visit: Payer: Medicare Other | Admitting: Cardiology

## 2020-05-26 ENCOUNTER — Encounter: Payer: Self-pay | Admitting: Cardiology

## 2020-05-26 ENCOUNTER — Other Ambulatory Visit: Payer: Self-pay

## 2020-05-26 VITALS — BP 135/74 | HR 77 | Temp 98.0°F | Resp 17 | Ht 61.0 in | Wt 212.2 lb

## 2020-05-26 DIAGNOSIS — I5032 Chronic diastolic (congestive) heart failure: Secondary | ICD-10-CM | POA: Diagnosis not present

## 2020-05-26 DIAGNOSIS — I10 Essential (primary) hypertension: Secondary | ICD-10-CM

## 2020-05-26 DIAGNOSIS — Z6841 Body Mass Index (BMI) 40.0 and over, adult: Secondary | ICD-10-CM

## 2020-05-26 DIAGNOSIS — E7800 Pure hypercholesterolemia, unspecified: Secondary | ICD-10-CM

## 2020-05-26 NOTE — Progress Notes (Signed)
Primary Physician/Referring:  Benito Mccreedy, MD  Patient ID: Elizabeth [name], female    DOB: 05-04-1945, 75 y.o.   MRN: [id number]  Chief Complaint  Patient presents with  . Hypertension  . chronic diastolic heart failure  . Follow-up    6 month   HPI:    Elizabeth [name]  is a 75 y.o. female  with history of left breast cancer in 2011 s/p mastectomy and is now in remission, hypertension, prediabetes, uterine cancer SP hysterectomy on 12/17/2018, depression, schizophrenia, morbid obesity, chronic dyspnea on exertion and leg edema presents here for 60-month office visit specifically to follow-up on dyspnea on exertion and also hypertension.  She is here on a 64-month office visit, states that her dyspnea has improved since weight loss, lost about 25 pounds over the past 6 months.  She has not had any recent hospitalization.  States that her leg edema is also essentially resolved or improved.  Denies chest pain or palpitations.  She is pleased that she has been losing weight and has noticed marked improvement in her dyspnea and overall wellbeing.  Past Medical History:  Diagnosis Date  . Anxiety   . Breast CA ([place])    (Lt) breast ca dx 02/2010  . Breast cancer ([place]) 03/31/2011  . Depression   . GERD (gastroesophageal reflux disease) 12/17/2012  . Hypertension   . Hypertension 12/17/2012  . Nausea alone 12/17/2012  . Schizophrenia [facility])    Past Surgical History:  Procedure Laterality Date  . BREAST SURGERY    . HYSTEROTOMY  12/2018  . MASTECTOMY Left   . ROBOTIC ASSISTED TOTAL HYSTERECTOMY WITH BILATERAL SALPINGO OOPHERECTOMY Bilateral 12/17/2018   Procedure: XI ROBOTIC ASSISTED TOTAL HYSTERECTOMY WITH BILATERAL SALPINGO OOPHORECTOMY;  Surgeon: Everitt Amber, MD;  Location: [facility];  Service: Gynecology;  Laterality: Bilateral;  . SENTINEL NODE BIOPSY N/A 12/17/2018   Procedure: SENTINEL NODE BIOPSY;  Surgeon: Everitt Amber, MD;  Location: [facility];  Service: Gynecology;   Laterality: N/A;  . TONSILLECTOMY     Family History  Problem Relation Age of Onset  . Lung cancer Mother   . Lung cancer Father   . Heart attack Brother   . Heart disease Sister   . Cancer Sister   . Heart disease Sister     Social History   Tobacco Use  . Smoking status: Never Smoker  . Smokeless tobacco: Never Used  Substance Use Topics  . Alcohol use: No   Marital Status: Divorced   ROS  Review of Systems  Constitutional: Positive for weight loss (intentional). Negative for weight gain.  Cardiovascular: Positive for dyspnea on exertion. Negative for chest pain, claudication, leg swelling and syncope.  Respiratory: Negative for cough and wheezing.   Musculoskeletal: Positive for joint pain (right knee). Negative for joint swelling.   Objective  Blood pressure 135/74, pulse 77, temperature 98 F (36.7 C), temperature source Temporal, resp. rate 17, height $RemoveBe'5\' 1"'uTFOOUldY$  (1.549 m), weight 212 lb 3.2 oz (96.3 kg), SpO2 98 %.  Vitals with BMI 05/26/2020 11/27/2019 10/07/2019  Height $Remov'5\' 1"'YAoVlC$  $Remove'5\' 1"'SUCoGPH$  $RemoveB'5\' 1"'uhckgQAB$   Weight 212 lbs 3 oz 234 lbs 231 lbs  BMI 40.12 50.93 26.71  Systolic 245 809 983  Diastolic 74 52 54  Pulse 77 59 70     Physical Exam Constitutional:      General: She is not in acute distress.    Comments: Morbidly obese   Cardiovascular:     Rate and Rhythm: Normal rate and regular rhythm.  Pulses: Normal pulses and intact distal pulses.          Carotid pulses are on the right side with bruit and on the left side with bruit.    Heart sounds: Murmur heard.   Early systolic murmur is present with a grade of 2/6 at the upper right sternal border. No gallop.      Comments: Trace edema  Pulmonary:     Effort: Pulmonary effort is normal. No accessory muscle usage or respiratory distress.     Breath sounds: Normal breath sounds.  Abdominal:     General: Bowel sounds are normal.     Palpations: Abdomen is soft.     Comments: Obese and small pannus present    Laboratory  examination:   Recent Labs    05/30/19 1618 07/16/19 1624  NA 138 142  K 4.5 4.1  CL 101 104  CO2 24 24  GLUCOSE 129* 91  BUN 29* 20  CREATININE 1.33* 1.29*  CALCIUM 10.1 9.6  GFRNONAA 39* 41*  GFRAA 45* 47*   CrCl cannot be calculated (Patient's most recent lab result is older than the maximum 21 days allowed.).  CMP Latest Ref Rng & Units 07/16/2019 05/30/2019 04/23/2019  Glucose 65 - 99 mg/dL 91 129(H) 121(H)  BUN 8 - 27 mg/dL 20 29(H) 23  Creatinine 0.57 - 1.00 mg/dL 1.29(H) 1.33(H) 1.19(H)  Sodium 134 - 144 mmol/L 142 138 142  Potassium 3.5 - 5.2 mmol/L 4.1 4.5 4.5  Chloride 96 - 106 mmol/L 104 101 108(H)  CO2 20 - 29 mmol/L $RemoveB'24 24 21  'zkkkYwHo$ Calcium 8.7 - 10.3 mg/dL 9.6 10.1 9.6  Total Protein 6.5 - 8.1 g/dL - - -  Total Bilirubin 0.3 - 1.2 mg/dL - - -  Alkaline Phos 38 - 126 U/L - - -  AST 15 - 41 U/L - - -  ALT 0 - 44 U/L - - -   CBC Latest Ref Rng & Units 12/11/2018 03/18/2015 01/14/2015  WBC 4.0 - 10.5 K/uL 7.1 7.3 7.3  Hemoglobin 12.0 - 15.0 g/dL 12.9 11.8 11.7  Hematocrit 36.0 - 46.0 % 40.8 35.9 36.1  Platelets 150 - 400 K/uL 216 215 213   External labs:    Labs 04/29/2020:  Serum glucose 86 mg, BUN 24, creatinine 1.16, potassium 4.7, CMP otherwise normal.  Hb 13.0/HCT 39.9, platelets 190, normal indicis.  A1c 6.1%.  Labs 12/25/2019:  Total cholesterol 135, triglycerides 117, HDL 43, LDL 71.  Lipid Panel 11/18/2018:  HDL 57.000 mg LDL 69.000 mg  Cholesterol, total 143.000 mg Triglycerides 90.000 mg  A1C 6.100 %  TSH 1.960 UIU/ 10/02/2018  04/14/2019: ALT 13, AST 14, Alk Phosph 74. Total Bili 0.2.  05/30/2019: eGFR 45  Medications and allergies   Allergies  Allergen Reactions  . Amlodipine     Severe edema, SOB, weight gain, rash  . Ciprofloxacin Itching  . Ofloxacin Swelling  . Sulfamethoxazole-Trimethoprim Swelling    Current Outpatient Medications on File Prior to Visit  Medication Sig Dispense Refill  . ARIPiprazole (ABILIFY) 15 MG  tablet Take 15 mg by mouth at bedtime.     . benztropine (COGENTIN) 0.5 MG tablet Take 0.5 mg by mouth daily.     . Cholecalciferol (VITAMIN D) 125 MCG (5000 UT) CAPS Take 5,000 Units by mouth daily.    Marland Kitchen escitalopram (LEXAPRO) 20 MG tablet Take 20 mg by mouth at bedtime.     . furosemide (LASIX) 40 MG tablet TAKE ONE TABLET BY MOUTH DAILY (  Patient taking differently: 20 mg.) 30 tablet 0  . labetalol (NORMODYNE) 100 MG tablet TAKE TWO TABLETS BY MOUTH TWICE A DAY 120 tablet 0  . lisinopril (ZESTRIL) 20 MG tablet     . rosuvastatin (CRESTOR) 20 MG tablet     . spironolactone (ALDACTONE) 25 MG tablet Take 25 mg by mouth 2 (two) times daily.     . VENTOLIN HFA 108 (90 Base) MCG/ACT inhaler Inhale 2 puffs into the lungs every 6 (six) hours as needed for wheezing or shortness of breath.     . vitamin B-12 (CYANOCOBALAMIN) 1000 MCG tablet Take 1,000 mcg by mouth daily.     No current facility-administered medications on file prior to visit.    Radiology:   No results found.  Cardiac Studies:   Lexiscan (Walking with mod Bruce)Tetrofosmin Stress Test  04/16/2019: Nondiagnostic ECG stress. There is a fixed moderate defect in the inferior and apical regions consistent with soft tissue attenuation. Overall LV systolic function is normal without regional wall motion abnormalities. Stress LV EF: 71%.  No previous exam available for comparison. Low risk study.   PFT 08/18/2019: Normal per pulmonary medicine.  Echocardiogram 09/05/2019: Normal LV systolic function with EF 64%. Left ventricle cavity is normal in size. Mild concentric hypertrophy of the left ventricle. Normal global wall motion. Doppler evidence of grade II (pseudonormal) diastolic dysfunction. Diastolic dysfunction findings suggests elevated LA/LV end diastolic pressure. Calculated EF 64%. Left atrial cavity is moderately dilated in apical views.  Structurally normal mitral valve.  Mild systolic anterior motion of the MV chordae  with resultant mild to moderate (Grade II) mitral regurgitation. Structurally normal tricuspid valve.  Moderate tricuspid regurgitation. Moderate pulmonary hypertension. RVSP measures 47 mmHg. No significant change from 02/13/2019.   Carotid artery duplex  12/03/2019: Minimal stenosis in the right internal carotid artery (1-15%). Minimal stenosis in the left internal carotid artery (1-15%). Bilateral carotid arteries exhibit tortuosity and may be a source of bruit. Mild heterogeneous plaque noted.  Antegrade right vertebral artery flow. Antegrade left vertebral artery flow.  EKG      EKG 05/26/2020: Normal sinus rhythm at rate of 70 bpm, normal axis, no evidence of ischemia, normal EKG. NO SIGNIFICANT CHANGE FROM EKG 08/28/2019   Assessment     ICD-10-CM   1. Primary hypertension  I10 EKG 12-Lead  2. Chronic diastolic heart failure (HCC)  I50.32   3. Class 3 severe obesity due to excess calories without serious comorbidity with body mass index (BMI) of 40.0 to 44.9 in adult (HCC)  E66.01    Z68.41   4. Hypercholesteremia  E78.00     No orders of the defined types were placed in this encounter.   There are no discontinued medications.  Recommendations:   Alfredo Collymore  is a 75 y.o.female  with history of left breast cancer in 2011 s/p mastectomy and is now in remission, hypertension, prediabetes, uterine cancer SP hysterectomy on 12/17/2018, depression, schizophrenia, morbid obesity, chronic dyspnea on exertion and leg edema presents here for 4-month office visit.  Patient has lost about 23 pounds in weight intentionally since last office visit 6 months ago, dyspnea has improved significantly no further leg edema and denies any PND or orthopnea.  She has not had any chest pain either.  There is no clinical evidence of acute decompensated heart failure and she has not had any recent hospitalization.  Blood pressure is also well controlled.  I reviewed her external labs, lipids are  under excellent control and  renal function has also improved.  Overall patient is feeling well since weight loss.  I congratulated her and hopefully she will continue to follow the same strict guidelines with increasing weight loss.  Although she has carotid artery bruit, carotid duplex did not reveal any significant abnormality.  Her risk factors are now well controlled except for obesity which she is trying her best to lose weight.  I will see her back in 1 year or sooner if problems.    Adrian Prows, MD, [facility] 05/26/2020, 2:39 PM Office: [phone]

## 2020-05-28 DIAGNOSIS — Z79899 Other long term (current) drug therapy: Secondary | ICD-10-CM | POA: Diagnosis not present

## 2020-06-16 ENCOUNTER — Ambulatory Visit
Admission: RE | Admit: 2020-06-16 | Discharge: 2020-06-16 | Disposition: A | Discharge status: Home / Self Care | Payer: Medicare Other | Source: Ambulatory Visit | Attending: Nephrology | Admitting: Nephrology

## 2020-06-16 DIAGNOSIS — N183 Chronic kidney disease, stage 3 unspecified: Secondary | ICD-10-CM

## 2020-07-21 DIAGNOSIS — R7303 Prediabetes: Secondary | ICD-10-CM | POA: Diagnosis not present

## 2020-07-21 DIAGNOSIS — R609 Edema, unspecified: Secondary | ICD-10-CM | POA: Diagnosis not present

## 2020-07-21 DIAGNOSIS — I5032 Chronic diastolic (congestive) heart failure: Secondary | ICD-10-CM | POA: Diagnosis not present

## 2020-07-21 DIAGNOSIS — E785 Hyperlipidemia, unspecified: Secondary | ICD-10-CM | POA: Diagnosis not present

## 2020-07-21 DIAGNOSIS — I129 Hypertensive chronic kidney disease with stage 1 through stage 4 chronic kidney disease, or unspecified chronic kidney disease: Secondary | ICD-10-CM | POA: Diagnosis not present

## 2020-07-21 DIAGNOSIS — N183 Chronic kidney disease, stage 3 unspecified: Secondary | ICD-10-CM | POA: Diagnosis not present

## 2020-08-18 DIAGNOSIS — F251 Schizoaffective disorder, depressive type: Secondary | ICD-10-CM | POA: Diagnosis not present

## 2020-08-18 DIAGNOSIS — G251 Drug-induced tremor: Secondary | ICD-10-CM | POA: Diagnosis not present

## 2020-10-18 DIAGNOSIS — E559 Vitamin D deficiency, unspecified: Secondary | ICD-10-CM | POA: Diagnosis not present

## 2020-10-18 DIAGNOSIS — N1831 Chronic kidney disease, stage 3a: Secondary | ICD-10-CM | POA: Diagnosis not present

## 2020-10-18 DIAGNOSIS — G629 Polyneuropathy, unspecified: Secondary | ICD-10-CM | POA: Diagnosis not present

## 2020-10-18 DIAGNOSIS — I10 Essential (primary) hypertension: Secondary | ICD-10-CM | POA: Diagnosis not present

## 2020-10-18 DIAGNOSIS — R7303 Prediabetes: Secondary | ICD-10-CM | POA: Diagnosis not present

## 2020-10-18 DIAGNOSIS — J302 Other seasonal allergic rhinitis: Secondary | ICD-10-CM | POA: Diagnosis not present

## 2020-10-18 DIAGNOSIS — I5032 Chronic diastolic (congestive) heart failure: Secondary | ICD-10-CM | POA: Diagnosis not present

## 2020-10-18 DIAGNOSIS — E782 Mixed hyperlipidemia: Secondary | ICD-10-CM | POA: Diagnosis not present

## 2020-12-16 DIAGNOSIS — Z79899 Other long term (current) drug therapy: Secondary | ICD-10-CM | POA: Diagnosis not present

## 2020-12-16 DIAGNOSIS — F251 Schizoaffective disorder, depressive type: Secondary | ICD-10-CM | POA: Diagnosis not present

## 2020-12-16 DIAGNOSIS — G251 Drug-induced tremor: Secondary | ICD-10-CM | POA: Diagnosis not present

## 2020-12-28 DIAGNOSIS — I5032 Chronic diastolic (congestive) heart failure: Secondary | ICD-10-CM | POA: Diagnosis not present

## 2020-12-28 DIAGNOSIS — I10 Essential (primary) hypertension: Secondary | ICD-10-CM | POA: Diagnosis not present

## 2020-12-28 DIAGNOSIS — Z23 Encounter for immunization: Secondary | ICD-10-CM | POA: Diagnosis not present

## 2020-12-28 DIAGNOSIS — N1831 Chronic kidney disease, stage 3a: Secondary | ICD-10-CM | POA: Diagnosis not present

## 2020-12-28 DIAGNOSIS — R7303 Prediabetes: Secondary | ICD-10-CM | POA: Diagnosis not present

## 2020-12-28 DIAGNOSIS — G629 Polyneuropathy, unspecified: Secondary | ICD-10-CM | POA: Diagnosis not present

## 2020-12-28 DIAGNOSIS — J302 Other seasonal allergic rhinitis: Secondary | ICD-10-CM | POA: Diagnosis not present

## 2020-12-28 DIAGNOSIS — E559 Vitamin D deficiency, unspecified: Secondary | ICD-10-CM | POA: Diagnosis not present

## 2020-12-28 DIAGNOSIS — E782 Mixed hyperlipidemia: Secondary | ICD-10-CM | POA: Diagnosis not present

## 2021-03-24 DIAGNOSIS — R2681 Unsteadiness on feet: Secondary | ICD-10-CM | POA: Diagnosis not present

## 2021-03-24 DIAGNOSIS — M1990 Unspecified osteoarthritis, unspecified site: Secondary | ICD-10-CM | POA: Diagnosis not present

## 2021-03-24 DIAGNOSIS — Z0000 Encounter for general adult medical examination without abnormal findings: Secondary | ICD-10-CM | POA: Diagnosis not present

## 2021-03-24 DIAGNOSIS — Z6839 Body mass index (BMI) 39.0-39.9, adult: Secondary | ICD-10-CM | POA: Diagnosis not present

## 2021-03-24 DIAGNOSIS — I10 Essential (primary) hypertension: Secondary | ICD-10-CM | POA: Diagnosis not present

## 2021-04-22 DIAGNOSIS — F251 Schizoaffective disorder, depressive type: Secondary | ICD-10-CM | POA: Diagnosis not present

## 2021-05-24 DIAGNOSIS — E559 Vitamin D deficiency, unspecified: Secondary | ICD-10-CM | POA: Diagnosis not present

## 2021-05-24 DIAGNOSIS — J302 Other seasonal allergic rhinitis: Secondary | ICD-10-CM | POA: Diagnosis not present

## 2021-05-24 DIAGNOSIS — R7303 Prediabetes: Secondary | ICD-10-CM | POA: Diagnosis not present

## 2021-05-24 DIAGNOSIS — M1712 Unilateral primary osteoarthritis, left knee: Secondary | ICD-10-CM | POA: Diagnosis not present

## 2021-05-24 DIAGNOSIS — I10 Essential (primary) hypertension: Secondary | ICD-10-CM | POA: Diagnosis not present

## 2021-05-24 DIAGNOSIS — I5032 Chronic diastolic (congestive) heart failure: Secondary | ICD-10-CM | POA: Diagnosis not present

## 2021-05-24 DIAGNOSIS — N1831 Chronic kidney disease, stage 3a: Secondary | ICD-10-CM | POA: Diagnosis not present

## 2021-05-24 DIAGNOSIS — Z0001 Encounter for general adult medical examination with abnormal findings: Secondary | ICD-10-CM | POA: Diagnosis not present

## 2021-05-24 DIAGNOSIS — E782 Mixed hyperlipidemia: Secondary | ICD-10-CM | POA: Diagnosis not present

## 2021-05-24 DIAGNOSIS — G629 Polyneuropathy, unspecified: Secondary | ICD-10-CM | POA: Diagnosis not present

## 2021-05-26 ENCOUNTER — Encounter: Payer: Self-pay | Admitting: Cardiology

## 2021-05-26 ENCOUNTER — Other Ambulatory Visit: Payer: Self-pay

## 2021-05-26 ENCOUNTER — Ambulatory Visit: Payer: Medicare Other | Admitting: Cardiology

## 2021-05-26 VITALS — BP 118/40 | HR 73 | Temp 97.9°F | Resp 17 | Ht 61.0 in | Wt 219.0 lb

## 2021-05-26 DIAGNOSIS — I5032 Chronic diastolic (congestive) heart failure: Secondary | ICD-10-CM

## 2021-05-26 DIAGNOSIS — Z79899 Other long term (current) drug therapy: Secondary | ICD-10-CM | POA: Diagnosis not present

## 2021-05-26 DIAGNOSIS — Z6841 Body Mass Index (BMI) 40.0 and over, adult: Secondary | ICD-10-CM | POA: Diagnosis not present

## 2021-05-26 DIAGNOSIS — I10 Essential (primary) hypertension: Secondary | ICD-10-CM

## 2021-05-26 NOTE — Progress Notes (Signed)
? ?Primary Physician/Referring:  Benito Mccreedy, MD ? ?Patient ID: Elizabeth [name], female    DOB: 27-Feb-1946, 76 y.o.   MRN: [id number] ? ?Chief Complaint  ?Patient presents with  ? Follow-up  ?  1 YEAR  ? CHRONIC DIASTOLIC HEART FAILURE  ? ?HPI:   ? ?Elizabeth [name]  is a 76 y.o. female  with history of left breast cancer in 2011 s/p mastectomy and is now in remission, hypertension, prediabetes, uterine cancer SP hysterectomy on 12/17/2018, depression, schizophrenia, morbid obesity, chronic dyspnea on exertion and leg edema presents for an annual visit. ? ?Since making lifestyle changes, her leg edema has completely resolved.  She has very mild chronic dyspnea but today due to decreased physical activity denies any specific complaints.  She has been compliant with taking all her medications and bring her medication list with her. ? ?Past Medical History:  ?Diagnosis Date  ? Anxiety   ? Breast CA ([place])   ? (Lt) breast ca dx 02/2010  ? Breast cancer ([place]) 03/31/2011  ? Depression   ? GERD (gastroesophageal reflux disease) 12/17/2012  ? Hypertension   ? Hypertension 12/17/2012  ? Nausea alone 12/17/2012  ? Schizophrenia ([place])   ? ?Social History  ? ?Tobacco Use  ? Smoking status: Never  ? Smokeless tobacco: Never  ?Substance Use Topics  ? Alcohol use: No  ? ?Marital Status: Divorced ?  ?ROS  ?Review of Systems  ?Cardiovascular:  Negative for chest pain, dyspnea on exertion and leg swelling.  ?Objective  ?Blood pressure (!) 118/40, pulse 73, temperature 97.9 ?F (36.6 ?C), temperature source Temporal, resp. rate 17, height '5\' 1"'$  (1.549 m), weight 219 lb (99.3 kg), SpO2 98 %.  ? ?  05/26/2021  ?  2:17 PM 05/26/2021  ?  2:10 PM 05/26/2020  ?  2:07 PM  ?Vitals with BMI  ?Height  '5\' 1"'$  '5\' 1"'$   ?Weight  219 lbs 212 lbs 3 oz  ?BMI  41.4 40.12  ?Systolic 962 952 841  ?Diastolic 40 44 74  ?Pulse 73 73 77  ?  ? Physical Exam ?Constitutional:   ?   Comments: Morbidly obese   ?Cardiovascular:  ?   Rate and Rhythm: Normal rate and  regular rhythm.  ?   Pulses: Normal pulses and intact distal pulses.     ?     Carotid pulses are  on the right side with bruit and  on the left side with bruit. ?   Heart sounds: Murmur heard.  ?Early systolic murmur is present with a grade of 2/6 at the upper right sternal border.  ?  No gallop.  ?Pulmonary:  ?   Effort: Pulmonary effort is normal. No accessory muscle usage or respiratory distress.  ?   Breath sounds: Normal breath sounds.  ?Abdominal:  ?   General: Bowel sounds are normal.  ?   Palpations: Abdomen is soft.  ?   Comments: Obese and small pannus present  ?Musculoskeletal:  ?   Right lower leg: No edema.  ?   Left lower leg: No edema.  ? ?Laboratory examination:  ? ?External labs:  ? ?Labs 05/24/2021: ? ?Total cholesterol 03/06/1938, triglycerides 76, HDL 66, LDL 57. ? ?Hb 12.1/HCT 37.4, platelets 194, normal indicis. ? ?A1c 6.1%. ? ?BUN 16, creatinine 1.13, potassium 4.4.  LFTs normal. ? ?BNP 65. ? ?TSH 1.960 UIU/ 10/02/2018 ? ?Medications and allergies  ? ?Allergies  ?Allergen Reactions  ? Amlodipine   ?  Severe edema, SOB, weight gain,  rash  ? Ciprofloxacin Itching  ? Ofloxacin Swelling  ? Sulfamethoxazole-Trimethoprim Swelling  ?  ? ?Current Outpatient Medications:  ?  ARIPiprazole (ABILIFY) 15 MG tablet, Take 15 mg by mouth at bedtime. , Disp: , Rfl:  ?  benztropine (COGENTIN) 0.5 MG tablet, Take 0.5 mg by mouth daily. , Disp: , Rfl:  ?  Cholecalciferol (VITAMIN D) 125 MCG (5000 UT) CAPS, Take 5,000 Units by mouth daily., Disp: , Rfl:  ?  escitalopram (LEXAPRO) 20 MG tablet, Take 20 mg by mouth at bedtime. , Disp: , Rfl:  ?  fluticasone (FLONASE) 50 MCG/ACT nasal spray, Place 1 spray into both nostrils daily., Disp: , Rfl:  ?  furosemide (LASIX) 40 MG tablet, TAKE ONE TABLET BY MOUTH DAILY (Patient taking differently: 20 mg.), Disp: 30 tablet, Rfl: 0 ?  labetalol (NORMODYNE) 100 MG tablet, TAKE TWO TABLETS BY MOUTH TWICE A DAY, Disp: 120 tablet, Rfl: 0 ?  lisinopril (ZESTRIL) 10 MG tablet, Take by  mouth., Disp: , Rfl:  ?  rosuvastatin (CRESTOR) 20 MG tablet, , Disp: , Rfl:  ?  spironolactone (ALDACTONE) 25 MG tablet, Take 25 mg by mouth 2 (two) times daily. , Disp: , Rfl:  ?  VENTOLIN HFA 108 (90 Base) MCG/ACT inhaler, Inhale 2 puffs into the lungs every 6 (six) hours as needed for wheezing or shortness of breath. , Disp: , Rfl:  ?  vitamin B-12 (CYANOCOBALAMIN) 1000 MCG tablet, Take 1,000 mcg by mouth daily., Disp: , Rfl:   ?Radiology:  ? ?No results found. ? ?Cardiac Studies:  ? ?Lexiscan (Walking with mod Bruce)Tetrofosmin Stress Test  04/16/2019: ?Nondiagnostic ECG stress. ?There is a fixed moderate defect in the inferior and apical regions consistent with soft tissue attenuation. ?Overall LV systolic function is normal without regional wall motion abnormalities. ?Stress LV EF: 71%.  ?No previous exam available for comparison. Low risk study.  ? ?PFT 08/18/2019: ?Normal per pulmonary medicine. ? ?Echocardiogram 09/05/2019: ?Normal LV systolic function with EF 64%. Left ventricle cavity is normal in size. Mild concentric hypertrophy of the left ventricle. Normal global wall motion. Doppler evidence of grade II (pseudonormal) diastolic dysfunction. Diastolic dysfunction findings suggests elevated LA/LV end diastolic pressure. Calculated EF 64%. ?Left atrial cavity is moderately dilated in apical views.  ?Structurally normal mitral valve.  Mild systolic anterior motion of the MV chordae with resultant mild to moderate (Grade II) mitral regurgitation. ?Structurally normal tricuspid valve.  Moderate tricuspid regurgitation. Moderate pulmonary hypertension. RVSP measures 47 mmHg. ?No significant change from 02/13/2019.  ? ?Carotid artery duplex  12/03/2019: ?Minimal stenosis in the right internal carotid artery (1-15%). ?Minimal stenosis in the left internal carotid artery (1-15%). ?Bilateral carotid arteries exhibit tortuosity and may be a source of bruit. Mild heterogeneous plaque noted.  ?Antegrade right  vertebral artery flow. Antegrade left vertebral artery flow. ? ?EKG  ?  ?EGD 05/26/2021 : sinus rhythm at rate of 65bpm. normal axis. no ischemia change. No significant change from EKG 05/26/2020. ? ?Assessment  ? ?  ICD-10-CM   ?1. Primary hypertension  I10 EKG 12-Lead  ?  ?2. Chronic diastolic heart failure (HCC)  I50.32 PCV ECHOCARDIOGRAM COMPLETE  ?  ?3. Class 3 severe obesity due to excess calories without serious comorbidity with body mass index (BMI) of 40.0 to 44.9 in adult [facility])  E66.01   ? Z68.41   ?  ?  ?No orders of the defined types were placed in this encounter. ?  ?Medications Discontinued During This Encounter  ?Medication Reason  ?  lisinopril (ZESTRIL) 20 MG tablet   ? valbenazine (INGREZZA) 40 MG capsule   ?  ?Recommendations:  ? ?Elizabeth [name]  is a 76 y.o.female  with history of left breast cancer in 2011 s/p mastectomy and is now in remission, hypertension, prediabetes, uterine cancer SP hysterectomy on 12/17/2018, depression, schizophrenia, morbid obesity, chronic dyspnea on exertion and leg edema. ? ?This is annual visit, fortunately she has not had any recent hospitalization.  Since seeing [place], she has been very compliant with taking her medications also brings her medications to physician visits. ? ?No clinical evidence of heart failure today.  She has a very soft systolic ejection murmur in the right upper sternal border.  She has had moderate mitral regurgitation previously and also moderate pulmonary hypertension.  Her like to follow-up on this with repeat echocardiogram. ? ?Physical exam also reveals bilateral carotid artery bruit however carotid duplex in 2021 had revealed only minimal disease. ? ?Her external labs reviewed, CBC is normal, renal function is normal, lipids are well controlled.  No changes in the medications were done today I will see her back in 6 months and if she remains stable, and there is no significant abnormality on the echocardiogram, I will see her back on a  as needed basis. ? ?Weight loss was again discussed with her.  After she started seeing as her BMI has reduced from 44 to the present 41 but she has gained some weight back.  We discussed taking lifestyle c

## 2021-06-01 ENCOUNTER — Other Ambulatory Visit: Payer: Self-pay

## 2021-06-01 ENCOUNTER — Ambulatory Visit: Payer: Medicare Other

## 2021-06-01 DIAGNOSIS — I5032 Chronic diastolic (congestive) heart failure: Secondary | ICD-10-CM

## 2021-06-20 ENCOUNTER — Other Ambulatory Visit: Payer: Self-pay | Admitting: Adult Health

## 2021-06-20 DIAGNOSIS — Z1231 Encounter for screening mammogram for malignant neoplasm of breast: Secondary | ICD-10-CM

## 2021-06-27 DIAGNOSIS — I10 Essential (primary) hypertension: Secondary | ICD-10-CM | POA: Diagnosis not present

## 2021-06-27 DIAGNOSIS — N1831 Chronic kidney disease, stage 3a: Secondary | ICD-10-CM | POA: Diagnosis not present

## 2021-06-27 DIAGNOSIS — R7303 Prediabetes: Secondary | ICD-10-CM | POA: Diagnosis not present

## 2021-07-11 ENCOUNTER — Ambulatory Visit: Payer: Medicare Other

## 2021-07-18 ENCOUNTER — Ambulatory Visit
Admission: RE | Admit: 2021-07-18 | Discharge: 2021-07-18 | Disposition: A | Discharge status: Home / Self Care | Payer: Medicare Other | Source: Ambulatory Visit | Attending: Adult Health | Admitting: Adult Health

## 2021-07-18 DIAGNOSIS — Z1231 Encounter for screening mammogram for malignant neoplasm of breast: Secondary | ICD-10-CM

## 2021-08-18 DIAGNOSIS — F251 Schizoaffective disorder, depressive type: Secondary | ICD-10-CM | POA: Diagnosis not present

## 2021-09-26 DIAGNOSIS — I5032 Chronic diastolic (congestive) heart failure: Secondary | ICD-10-CM | POA: Diagnosis not present

## 2021-09-26 DIAGNOSIS — I10 Essential (primary) hypertension: Secondary | ICD-10-CM | POA: Diagnosis not present

## 2021-09-26 DIAGNOSIS — E559 Vitamin D deficiency, unspecified: Secondary | ICD-10-CM | POA: Diagnosis not present

## 2021-09-26 DIAGNOSIS — R7303 Prediabetes: Secondary | ICD-10-CM | POA: Diagnosis not present

## 2021-09-26 DIAGNOSIS — E782 Mixed hyperlipidemia: Secondary | ICD-10-CM | POA: Diagnosis not present

## 2021-09-26 DIAGNOSIS — N1831 Chronic kidney disease, stage 3a: Secondary | ICD-10-CM | POA: Diagnosis not present

## 2021-11-28 ENCOUNTER — Ambulatory Visit: Payer: Medicare Other | Admitting: Cardiology

## 2021-12-02 ENCOUNTER — Ambulatory Visit: Payer: Medicare Other | Admitting: Cardiology

## 2021-12-02 ENCOUNTER — Encounter: Payer: Self-pay | Admitting: Cardiology

## 2021-12-02 VITALS — BP 139/59 | HR 67 | Temp 98.2°F | Resp 16 | Ht 61.0 in | Wt 222.0 lb

## 2021-12-02 DIAGNOSIS — E7800 Pure hypercholesterolemia, unspecified: Secondary | ICD-10-CM | POA: Diagnosis not present

## 2021-12-02 DIAGNOSIS — I340 Nonrheumatic mitral (valve) insufficiency: Secondary | ICD-10-CM

## 2021-12-02 DIAGNOSIS — I10 Essential (primary) hypertension: Secondary | ICD-10-CM | POA: Diagnosis not present

## 2021-12-02 DIAGNOSIS — R0989 Other specified symptoms and signs involving the circulatory and respiratory systems: Secondary | ICD-10-CM

## 2021-12-02 DIAGNOSIS — I5032 Chronic diastolic (congestive) heart failure: Secondary | ICD-10-CM | POA: Diagnosis not present

## 2021-12-02 NOTE — Patient Instructions (Signed)
Ordered an echocardiogram to follow-up on your leaky valve.  Unless this is markedly abnormal, no further testing is indicated.  I have also ordered carotid artery duplex as you have carotid bruit, to make sure you do not have any blockages.  Unless abnormal, no further testing is indicated.  Your cholesterol, kidney function, blood pressure are all well controlled.  I will see you as needed.

## 2021-12-02 NOTE — Progress Notes (Signed)
Primary Physician/Referring:  Jackie Plum, MD  Patient ID: Elizabeth [name], female    DOB: [birth year], 76 y.o.   MRN: [id number]  No chief complaint on file.  HPI:    Elizabeth [name]  is a 76 y.o. female  with history of left breast cancer in 2011 s/p mastectomy and is now in remission, hypertension, prediabetes, uterine cancer SP hysterectomy on 12/17/2018, depression, schizophrenia, morbid obesity, chronic dyspnea on exertion and leg edema presents for an annual visit.  Since making lifestyle changes, her leg edema has completely resolved.  She has very mild chronic dyspnea but today due to decreased physical activity denies any specific complaints.  She has been compliant with taking all her medications.  Past Medical History:  Diagnosis Date   Anxiety    Breast CA (HCC)    (Lt) breast ca dx 02/2010   Breast cancer (HCC) 03/31/2011   Depression    GERD (gastroesophageal reflux disease) 12/17/2012   Hypertension    Hypertension 12/17/2012   Nausea alone 12/17/2012   Schizophrenia (HCC)    Social History   Tobacco Use   Smoking status: Never   Smokeless tobacco: Never  Substance Use Topics   Alcohol use: No   Marital Status: Divorced   ROS  Review of Systems  Cardiovascular:  Negative for chest pain, dyspnea on exertion and leg swelling.   Objective  Blood pressure (!) 139/59, pulse 67, temperature 98.2 F (36.8 C), temperature source Temporal, resp. rate 16, height 5\' 1"  (1.549 m), weight 222 lb (100.7 kg), SpO2 98 %.     12/02/2021    1:00 PM 05/26/2021    2:17 PM 05/26/2021    2:10 PM  Vitals with BMI  Height 5\' 1"   5\' 1"   Weight 222 lbs  219 lbs  BMI 41.97  41.4  Systolic 139 118 05/28/2021  Diastolic 59 40 44  Pulse 67 73 73     Physical Exam Constitutional:      Comments: Morbidly obese   Cardiovascular:     Rate and Rhythm: Normal rate and regular rhythm.     Pulses: Normal pulses and intact distal pulses.          Carotid pulses are  on the right  side with bruit and  on the left side with bruit.    Heart sounds: Murmur heard.     Early systolic murmur is present with a grade of 2/6 at the upper right sternal border.     No gallop.  Pulmonary:     Effort: Pulmonary effort is normal. No accessory muscle usage or respiratory distress.     Breath sounds: Normal breath sounds.  Abdominal:     General: Bowel sounds are normal.     Palpations: Abdomen is soft.     Comments: Obese and small pannus present  Musculoskeletal:     Right lower leg: No edema.     Left lower leg: No edema.    Laboratory examination:   External labs:   Labs 05/26/2021:  Total cholesterol 139, triglycerides 76, LDL 57, non-HDL cholesterol 73.  Serum glucose 91 mg, BUN 21, creatinine 1.09, EGFR 53.  mL, potassium 4.7, LFTs normal.  Hb 12.1/HCT 37.4, platelets 194, normal indicis.  A1c 6.1%. BNP 65. TSH 1.960 UIU/ 10/02/2018  Medications and allergies   Allergies  Allergen Reactions   Amlodipine     Severe edema, SOB, weight gain, rash   Ciprofloxacin Itching   Ofloxacin Swelling   Sulfamethoxazole-Trimethoprim Swelling  Current Outpatient Medications:    ARIPiprazole (ABILIFY) 15 MG tablet, Take 15 mg by mouth at bedtime. , Disp: , Rfl:    benztropine (COGENTIN) 0.5 MG tablet, Take 0.5 mg by mouth daily. , Disp: , Rfl:    Cholecalciferol (VITAMIN D) 125 MCG (5000 UT) CAPS, Take 5,000 Units by mouth daily., Disp: , Rfl:    escitalopram (LEXAPRO) 20 MG tablet, Take 20 mg by mouth at bedtime. , Disp: , Rfl:    fluticasone (FLONASE) 50 MCG/ACT nasal spray, Place 1 spray into both nostrils daily., Disp: , Rfl:    furosemide (LASIX) 40 MG tablet, TAKE ONE TABLET BY MOUTH DAILY (Patient taking differently: 20 mg.), Disp: 30 tablet, Rfl: 0   labetalol (NORMODYNE) 100 MG tablet, TAKE TWO TABLETS BY MOUTH TWICE A DAY, Disp: 120 tablet, Rfl: 0   lisinopril (ZESTRIL) 10 MG tablet, Take by mouth., Disp: , Rfl:    rosuvastatin (CRESTOR) 20 MG tablet, ,  Disp: , Rfl:    spironolactone (ALDACTONE) 25 MG tablet, Take 25 mg by mouth 2 (two) times daily. , Disp: , Rfl:    VENTOLIN HFA 108 (90 Base) MCG/ACT inhaler, Inhale 2 puffs into the lungs every 6 (six) hours as needed for wheezing or shortness of breath. , Disp: , Rfl:    vitamin B-12 (CYANOCOBALAMIN) 1000 MCG tablet, Take 1,000 mcg by mouth daily., Disp: , Rfl:   Radiology:   No results found.  Cardiac Studies:   Lexiscan (Walking with mod Bruce)Tetrofosmin Stress Test  04/16/2019: Nondiagnostic ECG stress. There is a fixed moderate defect in the inferior and apical regions consistent with soft tissue attenuation. Overall LV systolic function is normal without regional wall motion abnormalities. Stress LV EF: 71%.  No previous exam available for comparison. Low risk study.   PFT 08/18/2019: Normal per pulmonary medicine.  Echocardiogram 09/05/2019: Normal LV systolic function with EF 64%. Left ventricle cavity is normal in size. Mild concentric hypertrophy of the left ventricle. Normal global wall motion. Doppler evidence of grade II (pseudonormal) diastolic dysfunction. Diastolic dysfunction findings suggests elevated LA/LV end diastolic pressure. Calculated EF 64%. Left atrial cavity is moderately dilated in apical views.  Structurally normal mitral valve.  Mild systolic anterior motion of the MV chordae with resultant mild to moderate (Grade II) mitral regurgitation. Structurally normal tricuspid valve.  Moderate tricuspid regurgitation. Moderate pulmonary hypertension. RVSP measures 47 mmHg. No significant change from 02/13/2019.   Carotid artery duplex  12/03/2019: Minimal stenosis in the right internal carotid artery (1-15%). Minimal stenosis in the left internal carotid artery (1-15%). Bilateral carotid arteries exhibit tortuosity and may be a source of bruit. Mild heterogeneous plaque noted.  Antegrade right vertebral artery flow. Antegrade left vertebral artery flow.  EKG    EKG 12/02/2021: Normal sinus rhythm with rate of 62 bpm, normal EKG. no significant change from 05/26/2021.   Assessment     ICD-10-CM   1. Primary hypertension  I10 EKG 12-Lead    2. Chronic diastolic heart failure (HCC)  I50.32 PCV ECHOCARDIOGRAM COMPLETE    3. Hypercholesteremia  E78.00     4. Moderate mitral regurgitation  I34.0 PCV ECHOCARDIOGRAM COMPLETE    5. Bilateral carotid bruits  R09.89 PCV CAROTID DUPLEX (BILATERAL)      No orders of the defined types were placed in this encounter.   There are no discontinued medications.   Recommendations:   Nehemiah Montee  is a 76 y.o.female  with history of left breast cancer in 2011 s/p mastectomy and  is now in remission, hypertension, prediabetes, uterine cancer SP hysterectomy on 12/17/2018, depression, schizophrenia, morbid obesity, chronic dyspnea on exertion and leg edema.  This is annual visit, fortunately she has not had any recent hospitalization.  Since seeing [place], she has been very compliant with taking her medications also brings her medications to physician visits.   No clinical evidence of heart failure today.  She has a very soft systolic ejection murmur in the right upper sternal border.  She has had moderate mitral regurgitation previously and also moderate pulmonary hypertension.  I will repeat echocardiogram to follow-up on the same.  Fortunately since 2021, she has not had any hospitalizations and last echocardiogram was 2 years ago.  Physical exam also reveals bilateral carotid artery bruit however carotid duplex in 2021 had revealed only minimal disease.  However on physical exam today left carotid artery bruit is clearly much louder.  I would like to confirm if there is any carotid stenosis.  Her external labs reviewed, CBC is normal, renal function is normal, lipids are well controlled.  No changes in the medications were done today .  Overall she has lost weight from 44 BMI to present 41 and has maintained  that weight loss.  I have encouraged her to diet again and try to lose some more weight.  I will personally perform the test and if I find abnormalities,  will perform further evaluation. Otherwise unless new on ongoing symptoms(patient advised to contact us), preventive  therapy is recommended. I will then see the patient on a PRN basis.    Adrian Prows, MD, [facility] 12/02/2021, 1:39 PM Office: [phone]

## 2021-12-12 DIAGNOSIS — F251 Schizoaffective disorder, depressive type: Secondary | ICD-10-CM | POA: Diagnosis not present

## 2021-12-12 DIAGNOSIS — G259 Extrapyramidal and movement disorder, unspecified: Secondary | ICD-10-CM | POA: Diagnosis not present

## 2021-12-12 DIAGNOSIS — G251 Drug-induced tremor: Secondary | ICD-10-CM | POA: Diagnosis not present

## 2021-12-29 ENCOUNTER — Ambulatory Visit: Payer: Medicare Other

## 2021-12-29 DIAGNOSIS — R0989 Other specified symptoms and signs involving the circulatory and respiratory systems: Secondary | ICD-10-CM | POA: Diagnosis not present

## 2021-12-29 DIAGNOSIS — I5032 Chronic diastolic (congestive) heart failure: Secondary | ICD-10-CM | POA: Diagnosis not present

## 2021-12-29 DIAGNOSIS — I340 Nonrheumatic mitral (valve) insufficiency: Secondary | ICD-10-CM

## 2022-01-02 NOTE — Progress Notes (Signed)
Please let her know the echo is very stable. She should continue to loose weight and walk daily

## 2022-01-03 NOTE — Progress Notes (Signed)
Called and spoke with patient regarding her echocardiogram results.

## 2022-04-21 DIAGNOSIS — N1831 Chronic kidney disease, stage 3a: Secondary | ICD-10-CM | POA: Diagnosis not present

## 2022-04-21 DIAGNOSIS — I5032 Chronic diastolic (congestive) heart failure: Secondary | ICD-10-CM | POA: Diagnosis not present

## 2022-04-21 DIAGNOSIS — I10 Essential (primary) hypertension: Secondary | ICD-10-CM | POA: Diagnosis not present

## 2022-04-21 DIAGNOSIS — F251 Schizoaffective disorder, depressive type: Secondary | ICD-10-CM | POA: Diagnosis not present

## 2022-04-21 DIAGNOSIS — R41840 Attention and concentration deficit: Secondary | ICD-10-CM | POA: Diagnosis not present

## 2022-04-21 DIAGNOSIS — Z23 Encounter for immunization: Secondary | ICD-10-CM | POA: Diagnosis not present

## 2022-04-21 DIAGNOSIS — E782 Mixed hyperlipidemia: Secondary | ICD-10-CM | POA: Diagnosis not present

## 2022-04-21 DIAGNOSIS — R7303 Prediabetes: Secondary | ICD-10-CM | POA: Diagnosis not present

## 2022-04-21 DIAGNOSIS — I110 Hypertensive heart disease with heart failure: Secondary | ICD-10-CM | POA: Diagnosis not present

## 2022-04-21 DIAGNOSIS — E559 Vitamin D deficiency, unspecified: Secondary | ICD-10-CM | POA: Diagnosis not present

## 2022-06-23 DIAGNOSIS — R41840 Attention and concentration deficit: Secondary | ICD-10-CM | POA: Diagnosis not present

## 2022-06-23 DIAGNOSIS — F251 Schizoaffective disorder, depressive type: Secondary | ICD-10-CM | POA: Diagnosis not present

## 2022-07-24 DIAGNOSIS — R7303 Prediabetes: Secondary | ICD-10-CM | POA: Diagnosis not present

## 2022-07-24 DIAGNOSIS — Z0001 Encounter for general adult medical examination with abnormal findings: Secondary | ICD-10-CM | POA: Diagnosis not present

## 2022-07-24 DIAGNOSIS — I5032 Chronic diastolic (congestive) heart failure: Secondary | ICD-10-CM | POA: Diagnosis not present

## 2022-07-24 DIAGNOSIS — I110 Hypertensive heart disease with heart failure: Secondary | ICD-10-CM | POA: Diagnosis not present

## 2022-07-24 DIAGNOSIS — E559 Vitamin D deficiency, unspecified: Secondary | ICD-10-CM | POA: Diagnosis not present

## 2022-07-24 DIAGNOSIS — N1831 Chronic kidney disease, stage 3a: Secondary | ICD-10-CM | POA: Diagnosis not present

## 2022-07-24 DIAGNOSIS — E782 Mixed hyperlipidemia: Secondary | ICD-10-CM | POA: Diagnosis not present

## 2022-09-22 DIAGNOSIS — R41840 Attention and concentration deficit: Secondary | ICD-10-CM | POA: Diagnosis not present

## 2022-09-22 DIAGNOSIS — F251 Schizoaffective disorder, depressive type: Secondary | ICD-10-CM | POA: Diagnosis not present

## 2022-09-25 ENCOUNTER — Other Ambulatory Visit: Payer: Self-pay | Admitting: Internal Medicine

## 2022-09-25 DIAGNOSIS — E782 Mixed hyperlipidemia: Secondary | ICD-10-CM | POA: Diagnosis not present

## 2022-09-25 DIAGNOSIS — I110 Hypertensive heart disease with heart failure: Secondary | ICD-10-CM | POA: Diagnosis not present

## 2022-09-25 DIAGNOSIS — N1831 Chronic kidney disease, stage 3a: Secondary | ICD-10-CM | POA: Diagnosis not present

## 2022-09-25 DIAGNOSIS — G4733 Obstructive sleep apnea (adult) (pediatric): Secondary | ICD-10-CM | POA: Diagnosis not present

## 2022-09-25 DIAGNOSIS — R7303 Prediabetes: Secondary | ICD-10-CM | POA: Diagnosis not present

## 2022-09-25 DIAGNOSIS — Z1231 Encounter for screening mammogram for malignant neoplasm of breast: Secondary | ICD-10-CM

## 2022-09-25 DIAGNOSIS — I5032 Chronic diastolic (congestive) heart failure: Secondary | ICD-10-CM | POA: Diagnosis not present

## 2022-09-25 DIAGNOSIS — E559 Vitamin D deficiency, unspecified: Secondary | ICD-10-CM | POA: Diagnosis not present

## 2022-09-25 DIAGNOSIS — R7989 Other specified abnormal findings of blood chemistry: Secondary | ICD-10-CM | POA: Diagnosis not present

## 2022-10-18 ENCOUNTER — Ambulatory Visit
Admission: RE | Admit: 2022-10-18 | Discharge: 2022-10-18 | Disposition: A | Discharge status: Home / Self Care | Payer: Medicare Other | Source: Ambulatory Visit | Attending: Internal Medicine | Admitting: Internal Medicine

## 2022-10-18 DIAGNOSIS — Z1231 Encounter for screening mammogram for malignant neoplasm of breast: Secondary | ICD-10-CM | POA: Diagnosis not present

## 2022-11-27 DIAGNOSIS — I5032 Chronic diastolic (congestive) heart failure: Secondary | ICD-10-CM | POA: Diagnosis not present

## 2022-11-27 DIAGNOSIS — E782 Mixed hyperlipidemia: Secondary | ICD-10-CM | POA: Diagnosis not present

## 2022-11-27 DIAGNOSIS — I110 Hypertensive heart disease with heart failure: Secondary | ICD-10-CM | POA: Diagnosis not present

## 2022-11-27 DIAGNOSIS — G4733 Obstructive sleep apnea (adult) (pediatric): Secondary | ICD-10-CM | POA: Diagnosis not present

## 2022-11-27 DIAGNOSIS — E559 Vitamin D deficiency, unspecified: Secondary | ICD-10-CM | POA: Diagnosis not present

## 2022-11-27 DIAGNOSIS — R7989 Other specified abnormal findings of blood chemistry: Secondary | ICD-10-CM | POA: Diagnosis not present

## 2022-11-27 DIAGNOSIS — R7303 Prediabetes: Secondary | ICD-10-CM | POA: Diagnosis not present

## 2022-11-27 DIAGNOSIS — Z23 Encounter for immunization: Secondary | ICD-10-CM | POA: Diagnosis not present

## 2022-11-27 DIAGNOSIS — N1831 Chronic kidney disease, stage 3a: Secondary | ICD-10-CM | POA: Diagnosis not present

## 2022-12-19 DIAGNOSIS — Z79899 Other long term (current) drug therapy: Secondary | ICD-10-CM | POA: Diagnosis not present

## 2022-12-19 DIAGNOSIS — F251 Schizoaffective disorder, depressive type: Secondary | ICD-10-CM | POA: Diagnosis not present

## 2023-01-29 DIAGNOSIS — N1831 Chronic kidney disease, stage 3a: Secondary | ICD-10-CM | POA: Diagnosis not present

## 2023-01-29 DIAGNOSIS — R7303 Prediabetes: Secondary | ICD-10-CM | POA: Diagnosis not present

## 2023-01-29 DIAGNOSIS — I5032 Chronic diastolic (congestive) heart failure: Secondary | ICD-10-CM | POA: Diagnosis not present

## 2023-01-29 DIAGNOSIS — G4733 Obstructive sleep apnea (adult) (pediatric): Secondary | ICD-10-CM | POA: Diagnosis not present

## 2023-01-29 DIAGNOSIS — R7989 Other specified abnormal findings of blood chemistry: Secondary | ICD-10-CM | POA: Diagnosis not present

## 2023-01-29 DIAGNOSIS — E559 Vitamin D deficiency, unspecified: Secondary | ICD-10-CM | POA: Diagnosis not present

## 2023-01-29 DIAGNOSIS — I110 Hypertensive heart disease with heart failure: Secondary | ICD-10-CM | POA: Diagnosis not present

## 2023-01-29 DIAGNOSIS — E782 Mixed hyperlipidemia: Secondary | ICD-10-CM | POA: Diagnosis not present

## 2023-03-15 DIAGNOSIS — I110 Hypertensive heart disease with heart failure: Secondary | ICD-10-CM | POA: Diagnosis not present

## 2023-03-15 DIAGNOSIS — N1831 Chronic kidney disease, stage 3a: Secondary | ICD-10-CM | POA: Diagnosis not present

## 2023-03-15 DIAGNOSIS — R7303 Prediabetes: Secondary | ICD-10-CM | POA: Diagnosis not present

## 2023-03-15 DIAGNOSIS — I5032 Chronic diastolic (congestive) heart failure: Secondary | ICD-10-CM | POA: Diagnosis not present

## 2023-03-15 DIAGNOSIS — G4733 Obstructive sleep apnea (adult) (pediatric): Secondary | ICD-10-CM | POA: Diagnosis not present

## 2023-03-15 DIAGNOSIS — E782 Mixed hyperlipidemia: Secondary | ICD-10-CM | POA: Diagnosis not present

## 2023-03-20 DIAGNOSIS — F251 Schizoaffective disorder, depressive type: Secondary | ICD-10-CM | POA: Diagnosis not present

## 2023-06-19 DIAGNOSIS — E782 Mixed hyperlipidemia: Secondary | ICD-10-CM | POA: Diagnosis not present

## 2023-06-19 DIAGNOSIS — J302 Other seasonal allergic rhinitis: Secondary | ICD-10-CM | POA: Diagnosis not present

## 2023-06-19 DIAGNOSIS — I5032 Chronic diastolic (congestive) heart failure: Secondary | ICD-10-CM | POA: Diagnosis not present

## 2023-06-19 DIAGNOSIS — F251 Schizoaffective disorder, depressive type: Secondary | ICD-10-CM | POA: Diagnosis not present

## 2023-06-19 DIAGNOSIS — I110 Hypertensive heart disease with heart failure: Secondary | ICD-10-CM | POA: Diagnosis not present

## 2023-06-19 DIAGNOSIS — R7303 Prediabetes: Secondary | ICD-10-CM | POA: Diagnosis not present

## 2023-06-19 DIAGNOSIS — R21 Rash and other nonspecific skin eruption: Secondary | ICD-10-CM | POA: Diagnosis not present

## 2023-06-19 DIAGNOSIS — G4733 Obstructive sleep apnea (adult) (pediatric): Secondary | ICD-10-CM | POA: Diagnosis not present

## 2023-06-19 DIAGNOSIS — E559 Vitamin D deficiency, unspecified: Secondary | ICD-10-CM | POA: Diagnosis not present

## 2023-06-19 DIAGNOSIS — N1831 Chronic kidney disease, stage 3a: Secondary | ICD-10-CM | POA: Diagnosis not present

## 2023-08-11 ENCOUNTER — Inpatient Hospital Stay (HOSPITAL_COMMUNITY)
Admission: EM | Admit: 2023-08-11 | Discharge: 2023-08-14 | DRG: 557 | Disposition: A | Discharge status: Home Health | Attending: Family Medicine | Admitting: Family Medicine

## 2023-08-11 ENCOUNTER — Emergency Department (HOSPITAL_COMMUNITY)

## 2023-08-11 DIAGNOSIS — Z90710 Acquired absence of both cervix and uterus: Secondary | ICD-10-CM

## 2023-08-11 DIAGNOSIS — D72829 Elevated white blood cell count, unspecified: Secondary | ICD-10-CM

## 2023-08-11 DIAGNOSIS — I2510 Atherosclerotic heart disease of native coronary artery without angina pectoris: Secondary | ICD-10-CM | POA: Diagnosis not present

## 2023-08-11 DIAGNOSIS — Z9012 Acquired absence of left breast and nipple: Secondary | ICD-10-CM | POA: Diagnosis not present

## 2023-08-11 DIAGNOSIS — R7881 Bacteremia: Secondary | ICD-10-CM | POA: Diagnosis present

## 2023-08-11 DIAGNOSIS — Y92009 Unspecified place in unspecified non-institutional (private) residence as the place of occurrence of the external cause: Secondary | ICD-10-CM | POA: Diagnosis not present

## 2023-08-11 DIAGNOSIS — F209 Schizophrenia, unspecified: Secondary | ICD-10-CM | POA: Diagnosis present

## 2023-08-11 DIAGNOSIS — K219 Gastro-esophageal reflux disease without esophagitis: Secondary | ICD-10-CM | POA: Diagnosis present

## 2023-08-11 DIAGNOSIS — Z853 Personal history of malignant neoplasm of breast: Secondary | ICD-10-CM

## 2023-08-11 DIAGNOSIS — Z90722 Acquired absence of ovaries, bilateral: Secondary | ICD-10-CM

## 2023-08-11 DIAGNOSIS — I110 Hypertensive heart disease with heart failure: Secondary | ICD-10-CM | POA: Diagnosis present

## 2023-08-11 DIAGNOSIS — R52 Pain, unspecified: Secondary | ICD-10-CM | POA: Diagnosis not present

## 2023-08-11 DIAGNOSIS — Z8542 Personal history of malignant neoplasm of other parts of uterus: Secondary | ICD-10-CM

## 2023-08-11 DIAGNOSIS — R112 Nausea with vomiting, unspecified: Secondary | ICD-10-CM | POA: Diagnosis not present

## 2023-08-11 DIAGNOSIS — Z79899 Other long term (current) drug therapy: Secondary | ICD-10-CM

## 2023-08-11 DIAGNOSIS — T796XXA Traumatic ischemia of muscle, initial encounter: Secondary | ICD-10-CM | POA: Diagnosis not present

## 2023-08-11 DIAGNOSIS — K829 Disease of gallbladder, unspecified: Secondary | ICD-10-CM | POA: Diagnosis present

## 2023-08-11 DIAGNOSIS — K828 Other specified diseases of gallbladder: Secondary | ICD-10-CM | POA: Diagnosis not present

## 2023-08-11 DIAGNOSIS — I10 Essential (primary) hypertension: Secondary | ICD-10-CM | POA: Diagnosis not present

## 2023-08-11 DIAGNOSIS — Z881 Allergy status to other antibiotic agents status: Secondary | ICD-10-CM

## 2023-08-11 DIAGNOSIS — Z6841 Body Mass Index (BMI) 40.0 and over, adult: Secondary | ICD-10-CM

## 2023-08-11 DIAGNOSIS — Z9079 Acquired absence of other genital organ(s): Secondary | ICD-10-CM | POA: Diagnosis not present

## 2023-08-11 DIAGNOSIS — R7989 Other specified abnormal findings of blood chemistry: Secondary | ICD-10-CM | POA: Diagnosis not present

## 2023-08-11 DIAGNOSIS — R42 Dizziness and giddiness: Secondary | ICD-10-CM | POA: Diagnosis not present

## 2023-08-11 DIAGNOSIS — F419 Anxiety disorder, unspecified: Secondary | ICD-10-CM | POA: Diagnosis present

## 2023-08-11 DIAGNOSIS — W19XXXA Unspecified fall, initial encounter: Secondary | ICD-10-CM | POA: Diagnosis present

## 2023-08-11 DIAGNOSIS — Z882 Allergy status to sulfonamides status: Secondary | ICD-10-CM | POA: Diagnosis not present

## 2023-08-11 DIAGNOSIS — R531 Weakness: Secondary | ICD-10-CM

## 2023-08-11 DIAGNOSIS — Z801 Family history of malignant neoplasm of trachea, bronchus and lung: Secondary | ICD-10-CM | POA: Diagnosis not present

## 2023-08-11 DIAGNOSIS — M6282 Rhabdomyolysis: Secondary | ICD-10-CM | POA: Diagnosis not present

## 2023-08-11 DIAGNOSIS — T796XXS Traumatic ischemia of muscle, sequela: Secondary | ICD-10-CM | POA: Diagnosis not present

## 2023-08-11 DIAGNOSIS — B951 Streptococcus, group B, as the cause of diseases classified elsewhere: Secondary | ICD-10-CM | POA: Diagnosis present

## 2023-08-11 DIAGNOSIS — R509 Fever, unspecified: Secondary | ICD-10-CM | POA: Diagnosis present

## 2023-08-11 DIAGNOSIS — Z8249 Family history of ischemic heart disease and other diseases of the circulatory system: Secondary | ICD-10-CM

## 2023-08-11 DIAGNOSIS — R1900 Intra-abdominal and pelvic swelling, mass and lump, unspecified site: Secondary | ICD-10-CM | POA: Diagnosis not present

## 2023-08-11 DIAGNOSIS — K838 Other specified diseases of biliary tract: Secondary | ICD-10-CM | POA: Diagnosis not present

## 2023-08-11 DIAGNOSIS — E669 Obesity, unspecified: Secondary | ICD-10-CM | POA: Diagnosis present

## 2023-08-11 DIAGNOSIS — I5033 Acute on chronic diastolic (congestive) heart failure: Secondary | ICD-10-CM | POA: Diagnosis not present

## 2023-08-11 DIAGNOSIS — Z043 Encounter for examination and observation following other accident: Secondary | ICD-10-CM | POA: Diagnosis not present

## 2023-08-11 DIAGNOSIS — J90 Pleural effusion, not elsewhere classified: Secondary | ICD-10-CM | POA: Diagnosis not present

## 2023-08-11 DIAGNOSIS — F32A Depression, unspecified: Secondary | ICD-10-CM | POA: Diagnosis present

## 2023-08-11 DIAGNOSIS — Z803 Family history of malignant neoplasm of breast: Secondary | ICD-10-CM | POA: Diagnosis not present

## 2023-08-11 DIAGNOSIS — L03116 Cellulitis of left lower limb: Secondary | ICD-10-CM | POA: Diagnosis present

## 2023-08-11 DIAGNOSIS — I517 Cardiomegaly: Secondary | ICD-10-CM | POA: Diagnosis not present

## 2023-08-11 LAB — COMPREHENSIVE METABOLIC PANEL WITH GFR
ALT: 34 U/L (ref 0–44)
AST: 56 U/L — ABNORMAL HIGH (ref 15–41)
Albumin: 3.6 g/dL (ref 3.5–5.0)
Alkaline Phosphatase: 69 U/L (ref 38–126)
Anion gap: 10 (ref 5–15)
BUN: 23 mg/dL (ref 8–23)
CO2: 22 mmol/L (ref 22–32)
Calcium: 8.7 mg/dL — ABNORMAL LOW (ref 8.9–10.3)
Chloride: 101 mmol/L (ref 98–111)
Creatinine, Ser: 1.16 mg/dL — ABNORMAL HIGH (ref 0.44–1.00)
GFR, Estimated: 48 mL/min — ABNORMAL LOW (ref >60)
Glucose, Bld: 123 mg/dL — ABNORMAL HIGH (ref 70–99)
Potassium: 3.8 mmol/L (ref 3.5–5.1)
Sodium: 133 mmol/L — ABNORMAL LOW (ref 135–145)
Total Bilirubin: 1.1 mg/dL (ref 0.0–1.2)
Total Protein: 7.2 g/dL (ref 6.5–8.1)

## 2023-08-11 LAB — URINALYSIS, ROUTINE W REFLEX MICROSCOPIC
Bilirubin Urine: NEGATIVE
Glucose, UA: NEGATIVE mg/dL
Ketones, ur: 5 mg/dL — AB
Nitrite: NEGATIVE
Protein, ur: 30 mg/dL — AB
Specific Gravity, Urine: 1.023 (ref 1.005–1.030)
pH: 5 (ref 5.0–8.0)

## 2023-08-11 LAB — CBC
HCT: 32.6 % — ABNORMAL LOW (ref 36.0–46.0)
Hemoglobin: 10.6 g/dL — ABNORMAL LOW (ref 12.0–15.0)
MCH: 29.5 pg (ref 26.0–34.0)
MCHC: 32.5 g/dL (ref 30.0–36.0)
MCV: 90.8 fL (ref 80.0–100.0)
Platelets: 157 10*3/uL (ref 150–400)
RBC: 3.59 MIL/uL — ABNORMAL LOW (ref 3.87–5.11)
RDW: 14.8 % (ref 11.5–15.5)
WBC: 12 10*3/uL — ABNORMAL HIGH (ref 4.0–10.5)
nRBC: 0 % (ref 0.0–0.2)

## 2023-08-11 LAB — RESP PANEL BY RT-PCR (RSV, FLU A&B, COVID)  RVPGX2
Influenza A by PCR: NEGATIVE
Influenza B by PCR: NEGATIVE
Resp Syncytial Virus by PCR: NEGATIVE
SARS Coronavirus 2 by RT PCR: NEGATIVE

## 2023-08-11 LAB — TROPONIN I (HIGH SENSITIVITY)
Troponin I (High Sensitivity): 68 ng/L — ABNORMAL HIGH (ref <18)
Troponin I (High Sensitivity): 61 ng/L — ABNORMAL HIGH (ref <18)

## 2023-08-11 LAB — CK: Total CK: 2025 U/L — ABNORMAL HIGH (ref 38–234)

## 2023-08-11 LAB — I-STAT CG4 LACTIC ACID, ED: Lactic Acid, Venous: 1.5 mmol/L (ref 0.5–1.9)

## 2023-08-11 LAB — BRAIN NATRIURETIC PEPTIDE: B Natriuretic Peptide: 324.5 pg/mL — ABNORMAL HIGH (ref 0.0–100.0)

## 2023-08-11 MED ORDER — SODIUM CHLORIDE 0.9 % IV SOLN
INTRAVENOUS | Status: AC
Start: 2023-08-11 — End: 2023-08-12

## 2023-08-11 MED ORDER — ONDANSETRON HCL 4 MG/2ML IJ SOLN: 4.0000 mg | Freq: Four times a day (QID) | INTRAMUSCULAR | Status: DC | PRN

## 2023-08-11 MED ORDER — IOHEXOL 350 MG/ML SOLN
100.0000 mL | Freq: Once | INTRAVENOUS | Status: AC | PRN
  Administered 2023-08-11: 100 mL via INTRAVENOUS

## 2023-08-11 MED ORDER — ACETAMINOPHEN 650 MG RE SUPP: 650.0000 mg | Freq: Four times a day (QID) | RECTAL | Status: DC | PRN

## 2023-08-11 MED ORDER — SODIUM CHLORIDE 0.9 % IV BOLUS
500.0000 mL | Freq: Once | INTRAVENOUS | Status: AC
  Administered 2023-08-11: 500 mL via INTRAVENOUS

## 2023-08-11 MED ORDER — ENOXAPARIN SODIUM 40 MG/0.4ML IJ SOSY
40.0000 mg | PREFILLED_SYRINGE | INTRAMUSCULAR | Status: DC
  Administered 2023-08-11 – 2023-08-13 (×3): 40 mg via SUBCUTANEOUS
  Filled 2023-08-11 (×3): qty 0.4

## 2023-08-11 MED ORDER — ACETAMINOPHEN 325 MG PO TABS
650.0000 mg | ORAL_TABLET | Freq: Four times a day (QID) | ORAL | Status: DC | PRN
Start: 2023-08-11 — End: 2023-08-14

## 2023-08-11 MED ORDER — HYDROCODONE-ACETAMINOPHEN 5-325 MG PO TABS: 1.0000 | ORAL_TABLET | ORAL | Status: DC | PRN

## 2023-08-11 MED ORDER — ONDANSETRON HCL 4 MG PO TABS: 4.0000 mg | ORAL_TABLET | Freq: Four times a day (QID) | ORAL | Status: DC | PRN

## 2023-08-11 MED ORDER — POLYETHYLENE GLYCOL 3350 17 G PO PACK: 17.0000 g | PACK | Freq: Every day | ORAL | Status: DC | PRN

## 2023-08-11 MED ORDER — TRAZODONE HCL 50 MG PO TABS
25.0000 mg | ORAL_TABLET | Freq: Every evening | ORAL | Status: DC | PRN
Start: 2023-08-11 — End: 2023-08-14

## 2023-08-11 NOTE — ED Notes (Signed)
 Pt ambulated to restroom with cane.

## 2023-08-11 NOTE — ED Provider Notes (Signed)
 Handed off from Jaye Mettle PA pending hospitalist acceptance.  Dr. Eber Goldsmith accepts patient to hospitalist service. No acute issues while pending admission.        Timmy Forbes, PA 08/11/23 1652    Flonnie Humphrey, DO 08/11/23 1657

## 2023-08-11 NOTE — H&P (Signed)
 History and Physical    Elizabeth [name] UJW:[id number] DOB: 09/06/1945 DOA: 08/11/2023  PCP: Tretha Fu, MD  Patient coming from: Home  I have personally briefly reviewed patient's old medical records in [facility]  Chief Complaint: Weakness fall fever nausea and vomiting since yesterday  HPI: Elizabeth [name] is a 78 y.o. female with medical history significant of anxiety, remote breast cancer, prior endometrial carcinoma gastroesophageal reflux disease, and hypertension  who presents to the emergency department after having been on the floor for an unknown period of time.  The patient's sister called her today and the patient stated she could not get up.  The patient's sister called the patient's daughter who then notified 911.  EMS found the patient unable to get off the floor but surrounded by pillows.  She did not have any significant pain.  She however did have some episodes of vomiting and fevers and chills at home with a temperature of 101 point per the daughter.  She took Tylenol  which improved.  She has been weaker than normal over the past few days.  She has had some more where she gets winded with conversations.  She had a remote workup for sleep apnea but they were unable to get an accurate sleep study due to the patient's rapid respiratory rate.  (Perhaps related to her history of schizophrenia?).  She does have a obesity which may be contributing to her sense of being winded.  She is followed by Dr. Ganji for heart failure with preserved ejection fraction.  Last echocardiogram in 2023.  She has no chest pain no abdominal pain but her legs are more swollen than normal.  ED Course: Mildly elevated white blood cell, CT K of 2025, ischial troponin of 68 with repeat of approximately the same, P of 324, tachypnea with maintained oxygen saturation which may be her baseline, and CT scan which showed an abnormal mass in the gallbladder stones versus could not be ruled out.  She is  therefore being admitted for further evaluation and management.  Review of Systems: As per HPI otherwise all other systems reviewed and  negative.   Past Medical History:  Diagnosis Date   Anxiety    Breast CA (HCC)    (Lt) breast ca dx 02/2010   Breast cancer (HCC) 03/31/2011   Depression    GERD (gastroesophageal reflux disease) 12/17/2012   Hypertension    Hypertension 12/17/2012   Nausea alone 12/17/2012   Schizophrenia (HCC)     Past Surgical History:  Procedure Laterality Date   BREAST SURGERY     HYSTEROTOMY  12/2018   MASTECTOMY Left    ROBOTIC ASSISTED TOTAL HYSTERECTOMY WITH BILATERAL SALPINGO OOPHERECTOMY Bilateral 12/17/2018   Procedure: XI ROBOTIC ASSISTED TOTAL HYSTERECTOMY WITH BILATERAL SALPINGO OOPHORECTOMY;  Surgeon: Alphonso Aschoff, MD;  Location: [facility];  Service: Gynecology;  Laterality: Bilateral;   SENTINEL NODE BIOPSY N/A 12/17/2018   Procedure: SENTINEL NODE BIOPSY;  Surgeon: Alphonso Aschoff, MD;  Location: [facility];  Service: Gynecology;  Laterality: N/A;   TONSILLECTOMY      Social History   Social History Narrative   Not on file     reports that she has never smoked. She has never used smokeless tobacco. She reports that she does not drink alcohol and does not use drugs.  Allergies  Allergen Reactions   Amlodipine      Severe edema, SOB, weight gain, rash   Ciprofloxacin Itching   Ofloxacin Swelling   Sulfamethoxazole-Trimethoprim Swelling  Family History  Problem Relation Age of Onset   Lung cancer Mother    Lung cancer Father    Heart disease Sister    Breast cancer Sister    Cancer Sister    Heart disease Sister    Heart attack Brother      Prior to Admission medications   Medication Sig Start Date End Date Taking? Authorizing Provider  ARIPiprazole (ABILIFY) 15 MG tablet Take 15 mg by mouth at bedtime.  08/15/17   [provider]  benztropine (COGENTIN) 0.5 MG tablet Take 0.5 mg by mouth daily.     [provider]   Cholecalciferol (VITAMIN D) 125 MCG (5000 UT) CAPS Take 5,000 Units by mouth daily.    [provider]  escitalopram (LEXAPRO) 20 MG tablet Take 20 mg by mouth at bedtime.     [provider]  fluticasone (FLONASE) 50 MCG/ACT nasal spray Place 1 spray into both nostrils daily. 12/10/19   [provider]  furosemide  (LASIX ) 40 MG tablet TAKE ONE TABLET BY MOUTH DAILY Patient taking differently: 20 mg. 07/29/19   Knox Perl, MD  labetalol  (NORMODYNE ) 100 MG tablet TAKE TWO TABLETS BY MOUTH TWICE A DAY 01/19/20   Knox Perl, MD  lisinopril  (ZESTRIL ) 10 MG tablet Take by mouth. 04/25/21   [provider]  rosuvastatin (CRESTOR) 20 MG tablet  09/22/19   [provider]  spironolactone (ALDACTONE) 25 MG tablet Take 25 mg by mouth 2 (two) times daily.     [provider]  VENTOLIN HFA 108 (90 Base) MCG/ACT inhaler Inhale 2 puffs into the lungs every 6 (six) hours as needed for wheezing or shortness of breath.  08/25/17   [provider]  vitamin B-12 (CYANOCOBALAMIN) 1000 MCG tablet Take 1,000 mcg by mouth daily.    [provider]    Physical Exam:  Constitutional: NAD, calm, comfortable Vitals:   08/11/23 1131 08/11/23 1230 08/11/23 1500  BP: (!) 126/50 (!) 124/52 (!) 150/48  Pulse: 69 68 75  Resp: (!) 28 (!) 35 (!) 21  Temp: 98.6 F (37 C)  98.3 F (36.8 C)  TempSrc: Oral  Oral  SpO2: 97% 100% 100%   Eyes: PERRL, lids and conjunctivae normal ENMT: Mucous membranes are moist. Posterior pharynx clear of any exudate or lesions.Normal dentition.  Neck: normal, supple, no masses, no thyromegaly Respiratory: clear to auscultation bilaterally, no wheezing, no crackles. Normal respiratory effort. No accessory muscle use.  Cardiovascular: Regular rate and rhythm, no murmurs / rubs / gallops. No extremity edema. 2+ pedal pulses. No carotid bruits.  Abdomen: Obese, no tenderness, no right upper quadrant tenderness with deep  palpation, no masses palpated. No hepatosplenomegaly. Bowel sounds positive.  Musculoskeletal: no clubbing / cyanosis. No joint deformity upper and lower extremities. Good ROM, no contractures. Normal muscle tone.  Skin: no rashes, lesions, ulcers. No induration Neurologic: CN 2-12 grossly intact. Sensation intact, DTR normal. Strength 5/5 in all 4.  Psychiatric: Normal judgment and insight. Alert and oriented x 1-2. Normal mood.    Labs on Admission: I have personally reviewed following labs and imaging studies  CBC: Recent Labs  Lab 08/11/23 1253  WBC 12.0*  HGB 10.6*  HCT 32.6*  MCV 90.8  PLT 157   Basic Metabolic Panel: Recent Labs  Lab 08/11/23 1253  NA 133*  K 3.8  CL 101  CO2 22  GLUCOSE 123*  BUN 23  CREATININE 1.16*  CALCIUM 8.7*   GFR: CrCl cannot be calculated (Unknown  ideal weight.). Liver Function Tests: Recent Labs  Lab 08/11/23 1253  AST 56*  ALT 34  ALKPHOS 69  BILITOT 1.1  PROT 7.2  ALBUMIN 3.6   Cardiac Enzymes: Recent Labs  Lab 08/11/23 1253  CKTOTAL 2,025*    Urine analysis:    Component Value Date/Time   COLORURINE YELLOW 08/11/2023 1252   APPEARANCEUR CLEAR 08/11/2023 1252   LABSPEC 1.023 08/11/2023 1252   PHURINE 5.0 08/11/2023 1252   GLUCOSEU NEGATIVE 08/11/2023 1252   HGBUR SMALL (A) 08/11/2023 1252   BILIRUBINUR NEGATIVE 08/11/2023 1252   KETONESUR 5 (A) 08/11/2023 1252   PROTEINUR 30 (A) 08/11/2023 1252   UROBILINOGEN 0.2 04/11/2013 1938   NITRITE NEGATIVE 08/11/2023 1252   LEUKOCYTESUR TRACE (A) 08/11/2023 1252    Radiological Exams on Admission: CT ABDOMEN PELVIS W CONTRAST Result Date: 08/11/2023 CLINICAL DATA:  Weakness, shortness of breath * Tracking Code: BO * EXAM: CT ANGIOGRAPHY CHEST CT ABDOMEN AND PELVIS WITH CONTRAST TECHNIQUE: Multidetector CT imaging of the chest was performed using the standard protocol during bolus administration of intravenous contrast. Multiplanar CT image reconstructions and MIPs were  obtained to evaluate the vascular anatomy. Multidetector CT imaging of the abdomen and pelvis was performed using the standard protocol during bolus administration of intravenous contrast. RADIATION DOSE REDUCTION: This exam was performed according to the departmental dose-optimization program which includes automated exposure control, adjustment of the mA and/or kV according to patient size and/or use of iterative reconstruction technique. CONTRAST:  OMNIPAQUE  IOHEXOL  350 MG/ML SOLN COMPARISON:  None Available. FINDINGS: CT CHEST ANGIOGRAM FINDINGS Cardiovascular: Satisfactory opacification of the pulmonary arteries to the segmental level. No evidence of pulmonary embolism. Cardiomegaly. Left coronary artery calcifications. No pericardial effusion. Mediastinum/Nodes: No enlarged mediastinal, hilar, or axillary lymph nodes. Thyroid  gland, trachea, and esophagus demonstrate no significant findings. Lungs/Pleura: Trace bilateral pleural effusions. Musculoskeletal: No chest wall abnormality. No acute osseous findings. Review of the MIP images confirms the above findings. CT ABDOMEN PELVIS FINDINGS Hepatobiliary: No solid liver abnormality is seen. Unusual, masslike lesion in the central gallbladder measuring 3.1 x 1.9 cm (series 2, image 29). No gallbladder wall thickening, or biliary dilatation. Pancreas: Unremarkable. No pancreatic ductal dilatation or surrounding inflammatory changes. Spleen: Normal in size without significant abnormality. Adrenals/Urinary Tract: Adrenal glands are unremarkable. Kidneys are normal, without renal calculi, solid lesion, or hydronephrosis. Bladder is unremarkable. Stomach/Bowel: Stomach is within normal limits. Appendix appears normal. No evidence of bowel wall thickening, distention, or inflammatory changes. Vascular/Lymphatic: Scattered aortic atherosclerosis. No enlarged abdominal or pelvic lymph nodes. Reproductive: Status post hysterectomy. Other: No abdominal wall hernia or  abnormality. No ascites. Musculoskeletal: No acute or significant osseous findings. IMPRESSION: 1. Negative examination for pulmonary embolism. 2. Trace bilateral pleural effusions. 3. Unusual, masslike lesion in the central gallbladder measuring 3.1 x 1.9 cm. This may reflect a conglomeration of gallstones focally contracted within the gallbladder but mass is difficult to exclude with this appearance. On a nonemergent, outpatient basis, consider ultrasound for initial further assessment with the possibility of contrast enhanced MRI to exclude soft tissue mass. 4. Cardiomegaly and coronary artery disease. Aortic Atherosclerosis (ICD10-I70.0). Electronically Signed   By: Fredricka Jenny M.D.   On: 08/11/2023 14:58   CT Angio Chest PE W and/or Wo Contrast Result Date: 08/11/2023 CLINICAL DATA:  Weakness, shortness of breath * Tracking Code: BO * EXAM: CT ANGIOGRAPHY CHEST CT ABDOMEN AND PELVIS WITH CONTRAST TECHNIQUE: Multidetector CT imaging of the chest was performed using the standard protocol during bolus administration of  intravenous contrast. Multiplanar CT image reconstructions and MIPs were obtained to evaluate the vascular anatomy. Multidetector CT imaging of the abdomen and pelvis was performed using the standard protocol during bolus administration of intravenous contrast. RADIATION DOSE REDUCTION: This exam was performed according to the departmental dose-optimization program which includes automated exposure control, adjustment of the mA and/or kV according to patient size and/or use of iterative reconstruction technique. CONTRAST:  OMNIPAQUE  IOHEXOL  350 MG/ML SOLN COMPARISON:  None Available. FINDINGS: CT CHEST ANGIOGRAM FINDINGS Cardiovascular: Satisfactory opacification of the pulmonary arteries to the segmental level. No evidence of pulmonary embolism. Cardiomegaly. Left coronary artery calcifications. No pericardial effusion. Mediastinum/Nodes: No enlarged mediastinal, hilar, or axillary lymph  nodes. Thyroid  gland, trachea, and esophagus demonstrate no significant findings. Lungs/Pleura: Trace bilateral pleural effusions. Musculoskeletal: No chest wall abnormality. No acute osseous findings. Review of the MIP images confirms the above findings. CT ABDOMEN PELVIS FINDINGS Hepatobiliary: No solid liver abnormality is seen. Unusual, masslike lesion in the central gallbladder measuring 3.1 x 1.9 cm (series 2, image 29). No gallbladder wall thickening, or biliary dilatation. Pancreas: Unremarkable. No pancreatic ductal dilatation or surrounding inflammatory changes. Spleen: Normal in size without significant abnormality. Adrenals/Urinary Tract: Adrenal glands are unremarkable. Kidneys are normal, without renal calculi, solid lesion, or hydronephrosis. Bladder is unremarkable. Stomach/Bowel: Stomach is within normal limits. Appendix appears normal. No evidence of bowel wall thickening, distention, or inflammatory changes. Vascular/Lymphatic: Scattered aortic atherosclerosis. No enlarged abdominal or pelvic lymph nodes. Reproductive: Status post hysterectomy. Other: No abdominal wall hernia or abnormality. No ascites. Musculoskeletal: No acute or significant osseous findings. IMPRESSION: 1. Negative examination for pulmonary embolism. 2. Trace bilateral pleural effusions. 3. Unusual, masslike lesion in the central gallbladder measuring 3.1 x 1.9 cm. This may reflect a conglomeration of gallstones focally contracted within the gallbladder but mass is difficult to exclude with this appearance. On a nonemergent, outpatient basis, consider ultrasound for initial further assessment with the possibility of contrast enhanced MRI to exclude soft tissue mass. 4. Cardiomegaly and coronary artery disease. Aortic Atherosclerosis (ICD10-I70.0). Electronically Signed   By: Fredricka Jenny M.D.   On: 08/11/2023 14:58   DG Chest Portable 1 View Result Date: 08/11/2023 CLINICAL DATA:  Shortness of breath. EXAM: PORTABLE CHEST 1  VIEW COMPARISON:  Chest radiograph dated 04/22/2013. FINDINGS: No focal consolidation, pleural effusion, or pneumothorax. Mild cardiomegaly. No acute osseous pathology. IMPRESSION: 1. No active disease. 2. Mild cardiomegaly. Electronically Signed   By: Angus Bark M.D.   On: 08/11/2023 12:45   CT Head Wo Contrast Result Date: 08/11/2023 CLINICAL DATA:  fall EXAM: CT HEAD WITHOUT CONTRAST TECHNIQUE: Contiguous axial images were obtained from the base of the skull through the vertex without intravenous contrast. RADIATION DOSE REDUCTION: This exam was performed according to the departmental dose-optimization program which includes automated exposure control, adjustment of the mA and/or kV according to patient size and/or use of iterative reconstruction technique. COMPARISON:  CT head 01/05/2010 FINDINGS: Brain: No evidence of large-territorial acute infarction. Redemonstration of prominent perivesicular space along the inferior aspect of the right basal ganglia. No parenchymal hemorrhage. No mass lesion. No extra-axial collection. No mass effect or midline shift. No hydrocephalus. Basilar cisterns are patent. Vascular: No hyperdense vessel. Skull: No acute fracture or focal lesion. Sinuses/Orbits: Paranasal sinuses and mastoid air cells are clear. The orbits are unremarkable. Other: None. IMPRESSION: No acute intracranial abnormality. Electronically Signed   By: Morgane  Naveau M.D.   On: 08/11/2023 12:45    EKG: Independently reviewed.  Sinus rhythm  with no ST/T-segment abnormalities  Assessment/Plan Principal Problem:   Rhabdomyolysis Active Problems:   Gallbladder mass   Nausea and vomiting   Weakness   Fever   Fall at home, initial encounter    1.  Rhabdomyolysis: Will admit the patient into the hospital and plan gentle hydration. Repeat CPK in AM. Follow creatinine.  2.  Gallbladder mass: - Unsure if this is related to her fever but it is the only abnormality I can identify - White  blood cell count is elevated - Will check ultrasound of the right upper quadrant and if necessary proceed to MRI as mentioned in CT scan report  3.  Nausea and vomiting: - As needed ondansetron  and IV fluid hydration  4.  Fall and weakness: Will likely need PT evaluation to determine if she is stable for discharge home given recent fall at home.  Patient may benefit from inpatient rehab versus home health PT and OT.  She does live alone.  5.  Fever: Unsure if this is related to her gallbladder pathology or an unspecified viral infection elevated white blood cell.    DVT prophylaxis: Lovenox  Code Status: Full code Family Communication: Spoke with patient's daughter who was at bedside during admission Disposition Plan: To be determined Consults called: None Admission status: Observation   Ronny Colas MD FACP [facility] Pager [phone]  How to contact the [facility] Attending or Consulting provider 7A - 7P or covering provider during after hours 7P -7A, for this patient?  Check the care team in [facility] and look for a) attending/consulting [facility] provider listed and b) the [facility] team listed Log into [email or web address] and use [facility] universal password to access. If you do not have the password, please contact the hospital operator. Locate the [facility] provider you are looking for under [facility] and page to a number that you can be directly reached. If you still have difficulty reaching the provider, please page the [facility] (Director on Call) for the Hospitalists listed on amion for assistance.  If 7PM-7AM, please contact night-coverage [email or web address] Password [facility]  08/11/2023, 4:46 PM

## 2023-08-11 NOTE — ED Notes (Signed)
 Pt was able to ambulate to restroom using her cane. Urine sample collected

## 2023-08-11 NOTE — ED Provider Notes (Signed)
 I provided a substantive portion of the care of this patient.  I personally made/approved the management plan for this patient and take responsibility for the patient management.  EKG Interpretation Date/Time:  Saturday August 11 2023 11:31:13 EDT Ventricular Rate:  69 PR Interval:  188 QRS Duration:  90 QT Interval:  437 QTC Calculation: 469 R Axis:   47  Text Interpretation: Sinus rhythm Confirmed by Lind Repine ([id number]) on 08/11/2023 12:52:09 PM   Patient's EKG shows normal sinus rhythm.  Patient 78 years old presents after sliding out of her chair today.  She had temperature of 101.4 yesterday.  Has had weakness.  On exam, patient alert and oriented.  Does have increased respiratory rate.  Chest x-ray did not show any acute findings.  Patient will get CT chest to rule out PE.  Will also CT abdomen.  Blood cultures lactate ordered.   Lind Repine, MD 08/11/23 [phone]

## 2023-08-11 NOTE — ED Triage Notes (Signed)
 Per EMS from home. Lives alone. Tripped and fell this morning.  No LOC. C/o weakness since this morning. C/o n/v for past three days. C/o fever since yesterday.  BP 124/88 HR 70 RR 28 97 on RA CBG 100 T 97.9

## 2023-08-11 NOTE — ED Provider Notes (Signed)
 [place] EMERGENCY DEPARTMENT AT [facility] Provider Note   CSN: [id number] Arrival date & time: 08/11/23  1117     History  Chief Complaint  Patient presents with   Fall   Weakness    Elizabeth [name] is a 78 y.o. female.  78 year old female presenting with multiple complaints.  Patient lives alone, was found down by EMS this morning, patient was unable to relay how long she may have been on the ground.  Patient's family members are at the bedside to provide collateral information.  She has had 2 episodes of vomiting since Thursday but none today, was noted to have a fever of 101 at home yesterday but this resolved with Tylenol .  She reports feeling weaker than normal over the past several days.  She notes worsening shortness of breath, per her family members "she gets winded just having a conversation", she is followed by Dr. Berry Bristol for CHF.  They also note that her legs appear more swollen than normal.  She denies chest pain, abdominal pain, nausea.   Fall  Weakness      Home Medications Prior to Admission medications   Medication Sig Start Date End Date Taking? Authorizing Provider  ARIPiprazole (ABILIFY) 15 MG tablet Take 15 mg by mouth at bedtime.  08/15/17   [provider]  benztropine (COGENTIN) 0.5 MG tablet Take 0.5 mg by mouth daily.     [provider]  Cholecalciferol (VITAMIN D) 125 MCG (5000 UT) CAPS Take 5,000 Units by mouth daily.    [provider]  escitalopram (LEXAPRO) 20 MG tablet Take 20 mg by mouth at bedtime.     [provider]  fluticasone (FLONASE) 50 MCG/ACT nasal spray Place 1 spray into both nostrils daily. 12/10/19   [provider]  furosemide  (LASIX ) 40 MG tablet TAKE ONE TABLET BY MOUTH DAILY Patient taking differently: 20 mg. 07/29/19   Knox Perl, MD  labetalol  (NORMODYNE ) 100 MG tablet TAKE TWO TABLETS BY MOUTH TWICE A DAY 01/19/20   Knox Perl, MD  lisinopril  (ZESTRIL ) 10 MG tablet Take  by mouth. 04/25/21   [provider]  rosuvastatin (CRESTOR) 20 MG tablet  09/22/19   [provider]  spironolactone (ALDACTONE) 25 MG tablet Take 25 mg by mouth 2 (two) times daily.     [provider]  VENTOLIN HFA 108 (90 Base) MCG/ACT inhaler Inhale 2 puffs into the lungs every 6 (six) hours as needed for wheezing or shortness of breath.  08/25/17   [provider]  vitamin B-12 (CYANOCOBALAMIN) 1000 MCG tablet Take 1,000 mcg by mouth daily.    [provider]      Allergies    Amlodipine , Ciprofloxacin, Ofloxacin, and Sulfamethoxazole-trimethoprim    Review of Systems   Review of Systems  Neurological:  Positive for weakness.    Physical Exam Updated Vital Signs  Vitals:   08/11/23 1131 08/11/23 1230 08/11/23 1500  BP: (!) 126/50 (!) 124/52 (!) 150/48  Pulse: 69 68 75  Resp: (!) 28 (!) 35 (!) 21  Temp: 98.6 F (37 C)  98.3 F (36.8 C)  TempSrc: Oral  Oral  SpO2: 97% 100% 100%    Physical Exam Vitals and nursing note reviewed.  HENT:     Head: Normocephalic.     Comments: Flushed cheeks L>R Eyes:     Extraocular Movements: Extraocular movements intact.     Pupils: Pupils are equal, round, and reactive to light.  Cardiovascular:  Rate and Rhythm: Normal rate and regular rhythm.  Pulmonary:     Breath sounds: Wheezing (Diffuse on expiration throughout) present.     Comments: Tachypnea Abdominal:     Palpations: Abdomen is soft.     Tenderness: There is no abdominal tenderness. There is no guarding.  Musculoskeletal:     Right lower leg: Edema (trace pitting) present.     Left lower leg: Edema (trace pitting) present.     Comments: Moves all extremities spontaneously without difficulty  Neurological:     Mental Status: She is alert.     Sensory: No sensory deficit.     Motor: Weakness present.     ED Results / Procedures / Treatments   Labs (all labs ordered are listed, but only abnormal results are  displayed) Labs Reviewed  CBC - Abnormal; Notable for the following components:      Result Value   WBC 12.0 (*)    RBC 3.59 (*)    Hemoglobin 10.6 (*)    HCT 32.6 (*)    All other components within normal limits  COMPREHENSIVE METABOLIC PANEL WITH GFR - Abnormal; Notable for the following components:   Sodium 133 (*)    Glucose, Bld 123 (*)    Creatinine, Ser 1.16 (*)    Calcium 8.7 (*)    AST 56 (*)    GFR, Estimated 48 (*)    All other components within normal limits  BRAIN NATRIURETIC PEPTIDE - Abnormal; Notable for the following components:   B Natriuretic Peptide 324.5 (*)    All other components within normal limits  CK - Abnormal; Notable for the following components:   Total CK 2,025 (*)    All other components within normal limits  URINALYSIS, ROUTINE W REFLEX MICROSCOPIC - Abnormal; Notable for the following components:   Hgb urine dipstick SMALL (*)    Ketones, ur 5 (*)    Protein, ur 30 (*)    Leukocytes,Ua TRACE (*)    Bacteria, UA RARE (*)    All other components within normal limits  TROPONIN I (HIGH SENSITIVITY) - Abnormal; Notable for the following components:   Troponin I (High Sensitivity) 68 (*)    All other components within normal limits  TROPONIN I (HIGH SENSITIVITY) - Abnormal; Notable for the following components:   Troponin I (High Sensitivity) 61 (*)    All other components within normal limits  RESP PANEL BY RT-PCR (RSV, FLU A&B, COVID)  [id number]  CULTURE, BLOOD (ROUTINE X 2)  CULTURE, BLOOD (ROUTINE X 2) W REFLEX TO ID PANEL  URINE CULTURE  I-STAT CG4 LACTIC ACID, ED  I-STAT CG4 LACTIC ACID, ED    EKG EKG Interpretation Date/Time:  Saturday August 11 2023 11:31:13 EDT Ventricular Rate:  69 PR Interval:  188 QRS Duration:  90 QT Interval:  437 QTC Calculation: 469 R Axis:   47  Text Interpretation: Sinus rhythm Confirmed by Lind Repine ([id number]) on 08/11/2023 12:52:09 PM  Radiology CT ABDOMEN PELVIS W CONTRAST Result Date:  08/11/2023 CLINICAL DATA:  Weakness, shortness of breath * Tracking Code: BO * EXAM: CT ANGIOGRAPHY CHEST CT ABDOMEN AND PELVIS WITH CONTRAST TECHNIQUE: Multidetector CT imaging of the chest was performed using the standard protocol during bolus administration of intravenous contrast. Multiplanar CT image reconstructions and MIPs were obtained to evaluate the vascular anatomy. Multidetector CT imaging of the abdomen and pelvis was performed using the standard protocol during bolus administration of intravenous contrast. RADIATION DOSE REDUCTION: This exam was performed according  to the departmental dose-optimization program which includes automated exposure control, adjustment of the mA and/or kV according to patient size and/or use of iterative reconstruction technique. CONTRAST:  OMNIPAQUE  IOHEXOL  350 MG/ML SOLN COMPARISON:  None Available. FINDINGS: CT CHEST ANGIOGRAM FINDINGS Cardiovascular: Satisfactory opacification of the pulmonary arteries to the segmental level. No evidence of pulmonary embolism. Cardiomegaly. Left coronary artery calcifications. No pericardial effusion. Mediastinum/Nodes: No enlarged mediastinal, hilar, or axillary lymph nodes. Thyroid  gland, trachea, and esophagus demonstrate no significant findings. Lungs/Pleura: Trace bilateral pleural effusions. Musculoskeletal: No chest wall abnormality. No acute osseous findings. Review of the MIP images confirms the above findings. CT ABDOMEN PELVIS FINDINGS Hepatobiliary: No solid liver abnormality is seen. Unusual, masslike lesion in the central gallbladder measuring 3.1 x 1.9 cm (series 2, image 29). No gallbladder wall thickening, or biliary dilatation. Pancreas: Unremarkable. No pancreatic ductal dilatation or surrounding inflammatory changes. Spleen: Normal in size without significant abnormality. Adrenals/Urinary Tract: Adrenal glands are unremarkable. Kidneys are normal, without renal calculi, solid lesion, or hydronephrosis. Bladder is  unremarkable. Stomach/Bowel: Stomach is within normal limits. Appendix appears normal. No evidence of bowel wall thickening, distention, or inflammatory changes. Vascular/Lymphatic: Scattered aortic atherosclerosis. No enlarged abdominal or pelvic lymph nodes. Reproductive: Status post hysterectomy. Other: No abdominal wall hernia or abnormality. No ascites. Musculoskeletal: No acute or significant osseous findings. IMPRESSION: 1. Negative examination for pulmonary embolism. 2. Trace bilateral pleural effusions. 3. Unusual, masslike lesion in the central gallbladder measuring 3.1 x 1.9 cm. This may reflect a conglomeration of gallstones focally contracted within the gallbladder but mass is difficult to exclude with this appearance. On a nonemergent, outpatient basis, consider ultrasound for initial further assessment with the possibility of contrast enhanced MRI to exclude soft tissue mass. 4. Cardiomegaly and coronary artery disease. Aortic Atherosclerosis (ICD10-I70.0). Electronically Signed   By: Fredricka Jenny M.D.   On: 08/11/2023 14:58   CT Angio Chest PE W and/or Wo Contrast Result Date: 08/11/2023 CLINICAL DATA:  Weakness, shortness of breath * Tracking Code: BO * EXAM: CT ANGIOGRAPHY CHEST CT ABDOMEN AND PELVIS WITH CONTRAST TECHNIQUE: Multidetector CT imaging of the chest was performed using the standard protocol during bolus administration of intravenous contrast. Multiplanar CT image reconstructions and MIPs were obtained to evaluate the vascular anatomy. Multidetector CT imaging of the abdomen and pelvis was performed using the standard protocol during bolus administration of intravenous contrast. RADIATION DOSE REDUCTION: This exam was performed according to the departmental dose-optimization program which includes automated exposure control, adjustment of the mA and/or kV according to patient size and/or use of iterative reconstruction technique. CONTRAST:  OMNIPAQUE  IOHEXOL  350 MG/ML SOLN  COMPARISON:  None Available. FINDINGS: CT CHEST ANGIOGRAM FINDINGS Cardiovascular: Satisfactory opacification of the pulmonary arteries to the segmental level. No evidence of pulmonary embolism. Cardiomegaly. Left coronary artery calcifications. No pericardial effusion. Mediastinum/Nodes: No enlarged mediastinal, hilar, or axillary lymph nodes. Thyroid  gland, trachea, and esophagus demonstrate no significant findings. Lungs/Pleura: Trace bilateral pleural effusions. Musculoskeletal: No chest wall abnormality. No acute osseous findings. Review of the MIP images confirms the above findings. CT ABDOMEN PELVIS FINDINGS Hepatobiliary: No solid liver abnormality is seen. Unusual, masslike lesion in the central gallbladder measuring 3.1 x 1.9 cm (series 2, image 29). No gallbladder wall thickening, or biliary dilatation. Pancreas: Unremarkable. No pancreatic ductal dilatation or surrounding inflammatory changes. Spleen: Normal in size without significant abnormality. Adrenals/Urinary Tract: Adrenal glands are unremarkable. Kidneys are normal, without renal calculi, solid lesion, or hydronephrosis. Bladder is unremarkable. Stomach/Bowel: Stomach is within normal  limits. Appendix appears normal. No evidence of bowel wall thickening, distention, or inflammatory changes. Vascular/Lymphatic: Scattered aortic atherosclerosis. No enlarged abdominal or pelvic lymph nodes. Reproductive: Status post hysterectomy. Other: No abdominal wall hernia or abnormality. No ascites. Musculoskeletal: No acute or significant osseous findings. IMPRESSION: 1. Negative examination for pulmonary embolism. 2. Trace bilateral pleural effusions. 3. Unusual, masslike lesion in the central gallbladder measuring 3.1 x 1.9 cm. This may reflect a conglomeration of gallstones focally contracted within the gallbladder but mass is difficult to exclude with this appearance. On a nonemergent, outpatient basis, consider ultrasound for initial further assessment  with the possibility of contrast enhanced MRI to exclude soft tissue mass. 4. Cardiomegaly and coronary artery disease. Aortic Atherosclerosis (ICD10-I70.0). Electronically Signed   By: Fredricka Jenny M.D.   On: 08/11/2023 14:58   DG Chest Portable 1 View Result Date: 08/11/2023 CLINICAL DATA:  Shortness of breath. EXAM: PORTABLE CHEST 1 VIEW COMPARISON:  Chest radiograph dated 04/22/2013. FINDINGS: No focal consolidation, pleural effusion, or pneumothorax. Mild cardiomegaly. No acute osseous pathology. IMPRESSION: 1. No active disease. 2. Mild cardiomegaly. Electronically Signed   By: Angus Bark M.D.   On: 08/11/2023 12:45   CT Head Wo Contrast Result Date: 08/11/2023 CLINICAL DATA:  fall EXAM: CT HEAD WITHOUT CONTRAST TECHNIQUE: Contiguous axial images were obtained from the base of the skull through the vertex without intravenous contrast. RADIATION DOSE REDUCTION: This exam was performed according to the departmental dose-optimization program which includes automated exposure control, adjustment of the mA and/or kV according to patient size and/or use of iterative reconstruction technique. COMPARISON:  CT head 01/05/2010 FINDINGS: Brain: No evidence of large-territorial acute infarction. Redemonstration of prominent perivesicular space along the inferior aspect of the right basal ganglia. No parenchymal hemorrhage. No mass lesion. No extra-axial collection. No mass effect or midline shift. No hydrocephalus. Basilar cisterns are patent. Vascular: No hyperdense vessel. Skull: No acute fracture or focal lesion. Sinuses/Orbits: Paranasal sinuses and mastoid air cells are clear. The orbits are unremarkable. Other: None. IMPRESSION: No acute intracranial abnormality. Electronically Signed   By: Morgane  Naveau M.D.   On: 08/11/2023 12:45    Procedures .Critical Care  Performed by: Kendrick Pax, PA-C Authorized by: Kendrick Pax, PA-C   Critical care provider statement:    Critical care time  (minutes):  35   Critical care time was exclusive of:  Separately billable procedures and treating other patients and teaching time   Critical care was time spent personally by me on the following activities:  Development of treatment plan with patient or surrogate, discussions with consultants, evaluation of patient's response to treatment, examination of patient, ordering and review of laboratory studies, ordering and review of radiographic studies, ordering and performing treatments and interventions, pulse oximetry, re-evaluation of patient's condition and review of old charts   Care discussed with: admitting provider       Medications Ordered in ED Medications  iohexol  (OMNIPAQUE ) 350 MG/ML injection 100 mL (100 mLs Intravenous Contrast Given 08/11/23 1445)  sodium chloride  0.9 % bolus 500 mL (500 mLs Intravenous New Bag/Given 08/11/23 1515)    ED Course/ Medical Decision Making/ A&P                                 Medical Decision Making This patient presents to the ED for concern of weakness/fall/shortness of breath, this involves an extensive number of treatment options, and is a complaint that carries  with it a high risk of complications and morbidity.  The differential diagnosis includes CHF exacerbation, pneumonia, COVID/flu/RSV, electrolyte disturbance.   Co morbidities that complicate the patient evaluation  CHF, HTN, h/o endometrial cancer   Additional history obtained:  Additional history obtained from record review External records from outside source obtained and reviewed including PCP notes   Lab Tests:  I Ordered, and personally interpreted labs.  The pertinent results include: CBC notable for leukocytosis of 12, hemoglobin of 10.6 however I have no baseline for comparison.  CMP notable for mild hyponatremia at 133, creatinine of 1.16 however this is similar to patient's baseline from 4 years ago, elevated AST at 56.  CK elevated at 2025.  Initial troponin elevated to  68, with repeat troponin of 61, low suspicion for ACS at this time.  BNP elevated at 324.5, I have no baseline for comparison.  Respiratory panel negative.  Urinalysis notable for RBCs/ketones/protein with trace leukocytes and rare bacteria, patient has no urinary complaints and this is unlikely representative of a true urinary tract infection, however I will send for culture.  Blood cultures pending.  Lactic of 1.5.   Imaging Studies ordered:  I ordered imaging studies including  CXR, head CT I independently visualized and interpreted imaging which showed - CXR: No active disease. Mild cardiomegaly. - head CT:  No acute intracranial abnormality. - CT PE study and CT abdomen/pelvis: 1. Negative examination for pulmonary embolism. 2. Trace bilateral pleural effusions. 3. Unusual, masslike lesion in the central gallbladder measuring 3.1 x 1.9 cm. This may reflect a conglomeration of gallstones focally contracted within the gallbladder but mass is difficult to exclude with this appearance. On a nonemergent, outpatient basis, consider ultrasound for initial further assessment with the possibility of contrast enhanced MRI to exclude soft tissue mass. 4. Cardiomegaly and coronary artery disease. I agree with the radiologist interpretation   Cardiac Monitoring: / EKG:  The patient was maintained on a cardiac monitor.  I personally viewed and interpreted the cardiac monitored which showed an underlying rhythm of: normal sinus rhythm   Consultations Obtained:  I requested consultation with the hospitalist service,  and discussed lab and imaging findings as well as pertinent plan - they recommend: will come and evaluate patient and determine if admission is appropriate   Problem List / ED Course / Critical interventions / Medication management  IV fluids for rehydration I have reviewed the patients home medicines and have made adjustments as needed   Test / Admission - Considered:  See above for  imaging interpretation.  Notable lab findings are as follows: White blood cell count of 12, CK of 2,025, initial troponin of 68, BNP of 324.5.  Patient is tachypneic upon my examination, however she is maintaining her oxygen saturation on room air.  Given patient is presenting with weakness and generalized change in her baseline functioning, as well as labs previously mentioned, I do feel that this patient is appropriate for hospital admission for further evaluation management of her symptoms.  I spoke with the hospitalist service who will come assess this patient, handed-off to oncoming PA-C Walt Gunner with hospitalist consult pending, plan for admission.    Amount and/or Complexity of Data Reviewed Labs: ordered. Radiology: ordered.  Risk Prescription drug management.           Final Clinical Impression(s) / ED Diagnoses Final diagnoses:  Non-traumatic rhabdomyolysis  Elevated troponin  Elevated brain natriuretic peptide (BNP) level  Leukocytosis, unspecified type    Rx / DC  Orders ED Discharge Orders     None         Adolm Ahumada 08/11/23 1546    Lind Repine, MD 08/14/23 1302

## 2023-08-11 NOTE — ED Notes (Signed)
Called Lab to add urine culture  

## 2023-08-12 DIAGNOSIS — Z881 Allergy status to other antibiotic agents status: Secondary | ICD-10-CM | POA: Diagnosis not present

## 2023-08-12 DIAGNOSIS — R1900 Intra-abdominal and pelvic swelling, mass and lump, unspecified site: Secondary | ICD-10-CM | POA: Diagnosis not present

## 2023-08-12 DIAGNOSIS — W19XXXA Unspecified fall, initial encounter: Secondary | ICD-10-CM | POA: Diagnosis present

## 2023-08-12 DIAGNOSIS — R509 Fever, unspecified: Secondary | ICD-10-CM | POA: Diagnosis present

## 2023-08-12 DIAGNOSIS — Z90722 Acquired absence of ovaries, bilateral: Secondary | ICD-10-CM | POA: Diagnosis not present

## 2023-08-12 DIAGNOSIS — E669 Obesity, unspecified: Secondary | ICD-10-CM | POA: Diagnosis present

## 2023-08-12 DIAGNOSIS — Z6841 Body Mass Index (BMI) 40.0 and over, adult: Secondary | ICD-10-CM | POA: Diagnosis not present

## 2023-08-12 DIAGNOSIS — R52 Pain, unspecified: Secondary | ICD-10-CM | POA: Diagnosis not present

## 2023-08-12 DIAGNOSIS — K829 Disease of gallbladder, unspecified: Secondary | ICD-10-CM | POA: Diagnosis present

## 2023-08-12 DIAGNOSIS — F209 Schizophrenia, unspecified: Secondary | ICD-10-CM | POA: Diagnosis present

## 2023-08-12 DIAGNOSIS — Z803 Family history of malignant neoplasm of breast: Secondary | ICD-10-CM | POA: Diagnosis not present

## 2023-08-12 DIAGNOSIS — R7881 Bacteremia: Secondary | ICD-10-CM | POA: Diagnosis present

## 2023-08-12 DIAGNOSIS — B951 Streptococcus, group B, as the cause of diseases classified elsewhere: Secondary | ICD-10-CM | POA: Diagnosis present

## 2023-08-12 DIAGNOSIS — Y92009 Unspecified place in unspecified non-institutional (private) residence as the place of occurrence of the external cause: Secondary | ICD-10-CM | POA: Diagnosis not present

## 2023-08-12 DIAGNOSIS — I5033 Acute on chronic diastolic (congestive) heart failure: Secondary | ICD-10-CM | POA: Diagnosis not present

## 2023-08-12 DIAGNOSIS — Z801 Family history of malignant neoplasm of trachea, bronchus and lung: Secondary | ICD-10-CM | POA: Diagnosis not present

## 2023-08-12 DIAGNOSIS — I110 Hypertensive heart disease with heart failure: Secondary | ICD-10-CM | POA: Diagnosis present

## 2023-08-12 DIAGNOSIS — R112 Nausea with vomiting, unspecified: Secondary | ICD-10-CM | POA: Diagnosis present

## 2023-08-12 DIAGNOSIS — T796XXS Traumatic ischemia of muscle, sequela: Secondary | ICD-10-CM

## 2023-08-12 DIAGNOSIS — L03116 Cellulitis of left lower limb: Secondary | ICD-10-CM | POA: Diagnosis present

## 2023-08-12 DIAGNOSIS — K828 Other specified diseases of gallbladder: Secondary | ICD-10-CM | POA: Diagnosis not present

## 2023-08-12 DIAGNOSIS — Z79899 Other long term (current) drug therapy: Secondary | ICD-10-CM | POA: Diagnosis not present

## 2023-08-12 DIAGNOSIS — Z8249 Family history of ischemic heart disease and other diseases of the circulatory system: Secondary | ICD-10-CM | POA: Diagnosis not present

## 2023-08-12 DIAGNOSIS — Z90710 Acquired absence of both cervix and uterus: Secondary | ICD-10-CM | POA: Diagnosis not present

## 2023-08-12 DIAGNOSIS — Z882 Allergy status to sulfonamides status: Secondary | ICD-10-CM | POA: Diagnosis not present

## 2023-08-12 DIAGNOSIS — R7989 Other specified abnormal findings of blood chemistry: Secondary | ICD-10-CM | POA: Diagnosis not present

## 2023-08-12 DIAGNOSIS — Z853 Personal history of malignant neoplasm of breast: Secondary | ICD-10-CM | POA: Diagnosis not present

## 2023-08-12 DIAGNOSIS — Z9012 Acquired absence of left breast and nipple: Secondary | ICD-10-CM | POA: Diagnosis not present

## 2023-08-12 DIAGNOSIS — F32A Depression, unspecified: Secondary | ICD-10-CM | POA: Diagnosis present

## 2023-08-12 DIAGNOSIS — M6282 Rhabdomyolysis: Secondary | ICD-10-CM | POA: Diagnosis present

## 2023-08-12 DIAGNOSIS — R531 Weakness: Secondary | ICD-10-CM | POA: Diagnosis not present

## 2023-08-12 DIAGNOSIS — Z9079 Acquired absence of other genital organ(s): Secondary | ICD-10-CM | POA: Diagnosis not present

## 2023-08-12 LAB — BLOOD CULTURE ID PANEL (REFLEXED) - BCID2

## 2023-08-12 LAB — CK: Total CK: 1696 U/L — ABNORMAL HIGH (ref 38–234)

## 2023-08-12 LAB — BASIC METABOLIC PANEL WITH GFR
Anion gap: 8 (ref 5–15)
BUN: 21 mg/dL (ref 8–23)
CO2: 22 mmol/L (ref 22–32)
Calcium: 8.5 mg/dL — ABNORMAL LOW (ref 8.9–10.3)
Chloride: 104 mmol/L (ref 98–111)
Creatinine, Ser: 1.09 mg/dL — ABNORMAL HIGH (ref 0.44–1.00)
GFR, Estimated: 52 mL/min — ABNORMAL LOW (ref >60)
Glucose, Bld: 90 mg/dL (ref 70–99)
Potassium: 3.7 mmol/L (ref 3.5–5.1)
Sodium: 134 mmol/L — ABNORMAL LOW (ref 135–145)

## 2023-08-12 LAB — CBC
HCT: 33.6 % — ABNORMAL LOW (ref 36.0–46.0)
Hemoglobin: 10.3 g/dL — ABNORMAL LOW (ref 12.0–15.0)
MCH: 29.3 pg (ref 26.0–34.0)
MCHC: 30.7 g/dL (ref 30.0–36.0)
MCV: 95.5 fL (ref 80.0–100.0)
Platelets: 147 10*3/uL — ABNORMAL LOW (ref 150–400)
RBC: 3.52 MIL/uL — ABNORMAL LOW (ref 3.87–5.11)
RDW: 15.2 % (ref 11.5–15.5)
WBC: 8.3 10*3/uL (ref 4.0–10.5)
nRBC: 0 % (ref 0.0–0.2)

## 2023-08-12 LAB — URINE CULTURE: Culture: <10000 — AB

## 2023-08-12 LAB — TSH: TSH: 1.285 u[IU]/mL (ref 0.350–4.500)

## 2023-08-12 MED ORDER — AMOXICILLIN-POT CLAVULANATE 875-125 MG PO TABS
1.0000 | ORAL_TABLET | Freq: Two times a day (BID) | ORAL | Status: DC
Start: 2023-08-12 — End: 2023-08-12
  Administered 2023-08-12: 1 via ORAL
  Filled 2023-08-12: qty 1

## 2023-08-12 MED ORDER — SODIUM CHLORIDE 0.9 % IV SOLN: INTRAVENOUS | Status: DC

## 2023-08-12 MED ORDER — PENICILLIN G POTASSIUM 20000000 UNITS IJ SOLR
12.0000 10*6.[IU] | Freq: Two times a day (BID) | INTRAVENOUS | Status: DC
  Administered 2023-08-12 – 2023-08-14 (×4): 12 10*6.[IU] via INTRAVENOUS
  Filled 2023-08-12 (×5): qty 12

## 2023-08-12 NOTE — Evaluation (Signed)
 Physical Therapy Evaluation Patient Details Name: Elizabeth [name]: [id number] DOB: 1945-05-10 Today's Date: 08/12/2023  History of Present Illness  78 y.o. female who presents to the emergency department after having been on the floor for an unknown period of time and admitted 6/725 for rhabdomyolysis and gallbladder mass. Past medical history significant of anxiety, remote breast cancer, prior endometrial carcinoma gastroesophageal reflux disease, and hypertension  Clinical Impression  Pt admitted with above diagnosis.  Pt currently with functional limitations due to the deficits listed below (see PT Problem List). Pt will benefit from acute skilled PT to increase their independence and safety with mobility to allow discharge.   Pt in recliner on arrival to room and agreeable to ambulate. Pt reports moderate DOE and LE weakness, so distance was limited to 30 ft with RW. SPO2 97% on room air upon return to recliner.  Pt reports she was doing well at home prior to not feeling well.  Pt typically uses a SPC and daughter assists with groceries.  Pt would benefit from HHPT and Rollator if possible (RW next best option).         If plan is discharge home, recommend the following: Assistance with cooking/housework;Assist for transportation;Help with stairs or ramp for entrance   Can travel by private vehicle        Equipment Recommendations Rollator (4 wheels)  Recommendations for Other Services       Functional Status Assessment Patient has had a recent decline in their functional status and demonstrates the ability to make significant improvements in function in a reasonable and predictable amount of time.     Precautions / Restrictions Precautions Precautions: Fall      Mobility  Bed Mobility               General bed mobility comments: pt in recliner on arrival    Transfers Overall transfer level: Needs assistance Equipment used: Rolling walker (2 wheels) Transfers: Sit  to/from Stand Sit to Stand: Contact guard assist           General transfer comment: verbal cues for hand placement    Ambulation/Gait Ambulation/Gait assistance: Contact guard assist Gait Distance (Feet): 30 Feet Assistive device: Rolling walker (2 wheels) Gait Pattern/deviations: Step-through pattern, Decreased stride length Gait velocity: decr     General Gait Details: verbal cues for RW positioning, pt reports moderate DOE and LE weakness, distance to tolerance; SPO2 97% on room air upon return to recliner  Stairs            Wheelchair Mobility     Tilt Bed    Modified Rankin (Stroke Patients Only)       Balance Overall balance assessment: History of Falls                                           Pertinent Vitals/Pain Pain Assessment Pain Assessment: No/denies pain    Home Living Family/patient expects to be discharged to:: Private residence Living Arrangements: Alone Available Help at Discharge: Family   Home Access: Ramped entrance         Home Equipment: [name] - single point      Prior Function Prior Level of Function : Independent/Modified Independent             Mobility Comments: uses SPC for ambulation, no longer drives so daughter gets groceries ADLs Comments: reports independence with  ADLs     Extremity/Trunk Assessment        Lower Extremity Assessment Lower Extremity Assessment: Generalized weakness       Communication   Communication Communication: No apparent difficulties    Cognition Arousal: Alert Behavior During Therapy: WFL for tasks assessed/performed   PT - Cognitive impairments: No apparent impairments                         Following commands: Intact       Cueing Cueing Techniques: Verbal cues     General Comments      Exercises     Assessment/Plan    PT Assessment Patient needs continued PT services  PT Problem List Decreased mobility;Decreased activity  tolerance;Decreased strength;Decreased knowledge of use of DME       PT Treatment Interventions Gait training;DME instruction;Balance training;Functional mobility training;Therapeutic activities;Therapeutic exercise;Patient/family education    PT Goals (Current goals can be found in the Care Plan section)  Acute Rehab PT Goals PT Goal Formulation: With patient Time For Goal Achievement: 08/26/23 Potential to Achieve Goals: Good    Frequency Min 3X/week     Co-evaluation               AM-PAC PT "6 Clicks" Mobility  Outcome Measure Help needed turning from your back to your side while in a flat bed without using bedrails?: A Little Help needed moving from lying on your back to sitting on the side of a flat bed without using bedrails?: A Little Help needed moving to and from a bed to a chair (including a wheelchair)?: A Little Help needed standing up from a chair using your arms (e.g., wheelchair or bedside chair)?: A Little Help needed to walk in hospital room?: A Little Help needed climbing 3-5 steps with a railing? : A Little 6 Click Score: 18    End of Session Equipment Utilized During Treatment: Gait belt Activity Tolerance: Patient tolerated treatment well Patient left: in chair;with call bell/phone within reach (no batteries in chair alarm, RN and NT aware) Nurse Communication: Mobility status PT Visit Diagnosis: Difficulty in walking, not elsewhere classified (R26.2);Muscle weakness (generalized) (M62.81)    Time: 1610-9604 PT Time Calculation (min) (ACUTE ONLY): 14 min   Charges:   PT Evaluation $PT Eval Low Complexity: 1 Low   PT General Charges $$ ACUTE PT VISIT: 1 Visit        Elizabeth [name] PT, DPT Physical Therapist Acute Rehabilitation Services Office: [phone]   Elizabeth [name] 08/12/2023, 12:24 PM

## 2023-08-12 NOTE — TOC Initial Note (Signed)
 Transition of Care [facility]) - Initial/Assessment Note    Patient Details  Name: Elizabeth [name] MRN: [id number] Date of Birth: 1945-05-27  Transition [facility] Care [facility]) CM/SW Contact:    Bari Leys, RN Phone Number: 08/12/2023, 10:19 AM  Clinical Narrative:  Met with patient at bedside to introduce role of TOC/NCM and review for dc planning, pt confirmed she has an established PCP, no current home care services, has a cane she uses for functional mobility, pt reports she lives alone with support from her dtr, family to provide transport at dc. PT eval pending, await recommendation. MOON completed. TOC will continue to follow.                   Expected Discharge Plan: Home w Home Health Services     Patient Goals and CMS Choice Patient states their goals for this hospitalization and ongoing recovery are:: return home          Expected Discharge Plan and Services       Living arrangements for the past 2 months: Apartment                                      Prior Living Arrangements/Services Living arrangements for the past 2 months: Apartment Lives with:: Self Patient language and need for interpreter reviewed:: Yes Do you feel safe going back to the place where you live?: Yes      Need for Family Participation in Patient Care: Yes (Comment) Care giver support system in place?: Yes (comment) Current home services: DME [name]) Criminal Activity/Legal Involvement Pertinent to Current Situation/Hospitalization: No - Comment as needed  Activities of Daily Living      Permission Sought/Granted                  Emotional Assessment Appearance:: Appears stated age Attitude/Demeanor/Rapport: Engaged Affect (typically observed): Accepting Orientation: : Oriented to Self, Oriented to Place, Oriented to  Time, Oriented to Situation Alcohol / Substance Use: Not Applicable Psych Involvement: No (comment)  Admission diagnosis:  Rhabdomyolysis [M62.82] Elevated  troponin [R79.89] Elevated brain natriuretic peptide (BNP) level [R79.89] Non-traumatic rhabdomyolysis [M62.82] Leukocytosis, unspecified type [D72.829] Patient Active Problem List   Diagnosis Date Noted   Rhabdomyolysis 08/11/2023   Fall at home, initial encounter 08/11/2023   Weakness 08/11/2023   Gallbladder mass 08/11/2023   Fever 08/11/2023   Nausea and vomiting 08/11/2023   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 [place] [address] Phone: [phone] Fax: [phone]     Social Drivers of Health (SDOH) Social History: SDOH Screenings   Tobacco Use: Low Risk  (10/18/2022)   SDOH Interventions:     Readmission Risk Interventions     No data to display

## 2023-08-12 NOTE — Plan of Care (Signed)

## 2023-08-12 NOTE — Progress Notes (Signed)
 [facility]  PROGRESS NOTE  Elizabeth [name] ZOX:[id number] DOB: January 05, 1946 DOA: 08/11/2023 PCP: Tretha Fu, MD   Brief HPI:    78 y.o. female with medical history significant of anxiety, remote breast cancer, prior endometrial carcinoma gastroesophageal reflux disease, and hypertension  who presents to the emergency department after having been on the floor for an unknown period of time.  The patient's sister called her today and the patient stated she could not get up.  The patient's sister called the patient's daughter who then notified 911.  EMS found the patient unable to get off the floor but surrounded by pillows.  She did not have any significant pain.  She however did have some episodes of vomiting and fevers and chills at home with a temperature of 101 point per the daughter.  She took Tylenol  which improved.  She has been weaker than normal over the past few days.  She has had some more where she gets winded with conversations.  She had a remote workup for sleep apnea but they were unable to get an accurate sleep study due to the patient's rapid respiratory rate.  (Perhaps related to her history of schizophrenia?).  She does have a obesity which may be contributing to her sense of being winded.  She is followed by Dr. Ganji for heart failure with preserved ejection fraction.  Last echocardiogram in 2023.  She has no chest pain no abdominal pain but her legs are more swollen than normal.   ED Course: Mildly elevated white blood cell, CT K of 2025, ischial troponin of 68 with repeat of approximately the same, P of 324, tachypnea with maintained oxygen saturation which may be her baseline, and CT scan which showed an abnormal mass in the gallbladder stones versus could not be ruled out.  She is therefore being admitted for further evaluation and management.   Assessment/Plan:    Rhabdomyolysis -Likely from being on the floor; CPK was elevated at 2025, improved to 1696 with IV  fluids - Continue normal saline at 75 mL/h  Left lower extremity cellulitis - Left lower extremity is tender to palpation with mild erythema - Concern for cellulitis - Will start Augmentin 1 tablet p.o. twice daily for 5 days - Also obtain venous duplex of left lower extremity to rule out DVT  Gallbladder mass - Seen on CT abdomen/pelvis - Ultrasound of abdomen confirms 1.8 cm solid-appearing irregular mass like region within the gallbladder body - Recommend to get dedicated MRI liver with and without contrast when stable  Nausea/vomiting - Continue Zofran  as needed  Fall/weakness - PT evaluation      Medications     enoxaparin  (LOVENOX ) injection  40 mg Subcutaneous Q24H     Data Reviewed:   CBG:  No results for input(s): "GLUCAP" in the last 168 hours.  SpO2: 99 %    Vitals:   08/11/23 2100 08/11/23 2105 08/11/23 2318 08/12/23 0257  BP:   (!) 123/52 (!) 116/49  Pulse: 69  72 77  Resp: (!) 26 (!) 30 (!) 30 (!) 30  Temp: 99 F (37.2 C)  99 F (37.2 C) 98.5 F (36.9 C)  TempSrc: Oral     SpO2: 98%  100% 99%      Data Reviewed:  Basic Metabolic Panel: Recent Labs  Lab 08/11/23 1253 08/12/23 0557  NA 133* 134*  K 3.8 3.7  CL 101 104  CO2 22 22  GLUCOSE 123* 90  BUN 23 21  CREATININE 1.16* 1.09*  CALCIUM 8.7* 8.5*    CBC: Recent Labs  Lab 08/11/23 1253 08/12/23 0557  WBC 12.0* 8.3  HGB 10.6* 10.3*  HCT 32.6* 33.6*  MCV 90.8 95.5  PLT 157 147*    LFT Recent Labs  Lab 08/11/23 1253  AST 56*  ALT 34  ALKPHOS 69  BILITOT 1.1  PROT 7.2  ALBUMIN 3.6     Antibiotics: Anti-infectives (From admission, onward)    None        DVT prophylaxis: Lovenox   Code Status: Full code  Family Communication: No family at bedside   CONSULTS    Subjective   Denies any complaints   Objective    Physical Examination:   Blood general-appears in no acute distress Heart-S1-S2, regular, no murmur auscultated Lungs-clear to  auscultation bilaterally, no wheezing or crackles auscultated Abdomen-soft, nontender, no organomegaly Extremities-left lower extremity is warm to touch, tender to palpation Neuro-alert, oriented x3, no focal deficit noted  Status is: Inpatient:             Elizabeth [name]   [facility] If 7PM-7AM, please contact night-coverage at [email or web address], Office  [phone]   08/12/2023, 7:55 AM  LOS: 0 days

## 2023-08-12 NOTE — Care Management Obs Status (Signed)
 MEDICARE OBSERVATION STATUS NOTIFICATION   Patient Details  Name: Elizabeth [name] MRN: [id number] Date of Birth: 1946/01/27   Medicare Observation Status Notification Given:  Yes    Bari Leys, RN 08/12/2023, 10:17 AM

## 2023-08-12 NOTE — Plan of Care (Signed)
  Problem: Education: Goal: Knowledge of General Education information will improve Description: Including pain rating scale, medication(s)/side effects and non-pharmacologic comfort measures Outcome: Progressing   Problem: Clinical Measurements: Goal: Ability to maintain clinical measurements within normal limits will improve Outcome: Progressing   Problem: Nutrition: Goal: Adequate nutrition will be maintained Outcome: Progressing   Problem: Skin Integrity: Goal: Risk for impaired skin integrity will decrease Outcome: Progressing   

## 2023-08-12 NOTE — Progress Notes (Signed)
 PHARMACY - PHYSICIAN COMMUNICATION CRITICAL VALUE ALERT - BLOOD CULTURE IDENTIFICATION (BCID)  Elizabeth [name] is an 78 y.o. female who presented to [facility] on 08/11/2023 with a chief complaint of fall & weakness.   Assessment:  1 of 4 bottles Strep agalactiae (Group B strep) - source could be cellulitis  Name of physician (or Provider) Contacted: Darletta Ehrich via secure chat  Current antibiotics: augmentin  Changes to prescribed antibiotics recommended:  Change to Penicillin continuous infusion 12 MU q12 hours  Results for orders placed or performed during the hospital encounter of 08/11/23  Blood Culture ID Panel (Reflexed) (Collected: 08/11/2023  2:01 PM)  Result Value Ref Range   Enterococcus faecalis NOT DETECTED NOT DETECTED   Enterococcus Faecium NOT DETECTED NOT DETECTED   Listeria monocytogenes NOT DETECTED NOT DETECTED   Staphylococcus species NOT DETECTED NOT DETECTED   Staphylococcus aureus (BCID) NOT DETECTED NOT DETECTED   Staphylococcus epidermidis NOT DETECTED NOT DETECTED   Staphylococcus lugdunensis NOT DETECTED NOT DETECTED   Streptococcus species DETECTED (A) NOT DETECTED   Streptococcus agalactiae DETECTED (A) NOT DETECTED   Streptococcus pneumoniae NOT DETECTED NOT DETECTED   Streptococcus pyogenes NOT DETECTED NOT DETECTED   A.calcoaceticus-baumannii NOT DETECTED NOT DETECTED   Bacteroides fragilis NOT DETECTED NOT DETECTED   Enterobacterales NOT DETECTED NOT DETECTED   Enterobacter cloacae complex NOT DETECTED NOT DETECTED   Escherichia coli NOT DETECTED NOT DETECTED   Klebsiella aerogenes NOT DETECTED NOT DETECTED   Klebsiella oxytoca NOT DETECTED NOT DETECTED   Klebsiella pneumoniae NOT DETECTED NOT DETECTED   Proteus species NOT DETECTED NOT DETECTED   Salmonella species NOT DETECTED NOT DETECTED   Serratia marcescens NOT DETECTED NOT DETECTED   Haemophilus influenzae NOT DETECTED NOT DETECTED   Neisseria meningitidis NOT DETECTED NOT DETECTED    Pseudomonas aeruginosa NOT DETECTED NOT DETECTED   Stenotrophomonas maltophilia NOT DETECTED NOT DETECTED   Candida albicans NOT DETECTED NOT DETECTED   Candida auris NOT DETECTED NOT DETECTED   Candida glabrata NOT DETECTED NOT DETECTED   Candida krusei NOT DETECTED NOT DETECTED   Candida parapsilosis NOT DETECTED NOT DETECTED   Candida tropicalis NOT DETECTED NOT DETECTED   Cryptococcus neoformans/gattii NOT DETECTED NOT DETECTED    Rubie Corona, Pharm.D Use secure chat for questions 08/12/2023 5:00 PM

## 2023-08-13 ENCOUNTER — Inpatient Hospital Stay (HOSPITAL_COMMUNITY)

## 2023-08-13 DIAGNOSIS — R7989 Other specified abnormal findings of blood chemistry: Secondary | ICD-10-CM | POA: Diagnosis not present

## 2023-08-13 DIAGNOSIS — R52 Pain, unspecified: Secondary | ICD-10-CM | POA: Diagnosis not present

## 2023-08-13 DIAGNOSIS — K828 Other specified diseases of gallbladder: Secondary | ICD-10-CM | POA: Diagnosis not present

## 2023-08-13 DIAGNOSIS — T796XXS Traumatic ischemia of muscle, sequela: Secondary | ICD-10-CM | POA: Diagnosis not present

## 2023-08-13 LAB — COMPREHENSIVE METABOLIC PANEL WITH GFR
ALT: 33 U/L (ref 0–44)
AST: 33 U/L (ref 15–41)
Albumin: 3.3 g/dL — ABNORMAL LOW (ref 3.5–5.0)
Alkaline Phosphatase: 63 U/L (ref 38–126)
Anion gap: 7 (ref 5–15)
BUN: 18 mg/dL (ref 8–23)
CO2: 23 mmol/L (ref 22–32)
Calcium: 8.5 mg/dL — ABNORMAL LOW (ref 8.9–10.3)
Chloride: 107 mmol/L (ref 98–111)
Creatinine, Ser: 0.92 mg/dL (ref 0.44–1.00)
GFR, Estimated: >60 mL/min (ref >60)
Glucose, Bld: 116 mg/dL — ABNORMAL HIGH (ref 70–99)
Potassium: 3.9 mmol/L (ref 3.5–5.1)
Sodium: 137 mmol/L (ref 135–145)
Total Bilirubin: 0.6 mg/dL (ref 0.0–1.2)
Total Protein: 7 g/dL (ref 6.5–8.1)

## 2023-08-13 LAB — CBC
HCT: 33.6 % — ABNORMAL LOW (ref 36.0–46.0)
Hemoglobin: 10.5 g/dL — ABNORMAL LOW (ref 12.0–15.0)
MCH: 28.9 pg (ref 26.0–34.0)
MCHC: 31.3 g/dL (ref 30.0–36.0)
MCV: 92.6 fL (ref 80.0–100.0)
Platelets: 142 10*3/uL — ABNORMAL LOW (ref 150–400)
RBC: 3.63 MIL/uL — ABNORMAL LOW (ref 3.87–5.11)
RDW: 14.9 % (ref 11.5–15.5)
WBC: 7 10*3/uL (ref 4.0–10.5)
nRBC: 0 % (ref 0.0–0.2)

## 2023-08-13 LAB — CK: Total CK: 690 U/L — ABNORMAL HIGH (ref 38–234)

## 2023-08-13 MED ORDER — FUROSEMIDE 20 MG PO TABS: 20.0000 mg | ORAL_TABLET | Freq: Every day | ORAL | Status: DC

## 2023-08-13 MED ORDER — FUROSEMIDE 10 MG/ML IJ SOLN
20.0000 mg | Freq: Once | INTRAMUSCULAR | Status: AC
  Administered 2023-08-13: 20 mg via INTRAVENOUS
  Filled 2023-08-13: qty 2

## 2023-08-13 MED ORDER — GADOBUTROL 1 MMOL/ML IV SOLN
10.0000 mL | Freq: Once | INTRAVENOUS | Status: AC | PRN
  Administered 2023-08-13: 10 mL via INTRAVENOUS

## 2023-08-13 NOTE — Progress Notes (Signed)
 Left lower extremity venous duplex has been completed. Preliminary results can be found in CV Proc through chart review.   08/13/23 10:25 AM Birda Buffy RVT

## 2023-08-13 NOTE — Plan of Care (Signed)
  Problem: Clinical Measurements: Goal: Will remain free from infection Outcome: Progressing Goal: Cardiovascular complication will be avoided Outcome: Progressing   Problem: Activity: Goal: Risk for activity intolerance will decrease Outcome: Progressing   

## 2023-08-13 NOTE — TOC Progression Note (Signed)
 Transition of Care [facility]) - Progression Note    Patient Details  Name: Elizabeth [name] MRN: [id number] Date of Birth: Jun 28, 1945  Transition of Care [facility]) CM/SW Contact  Marty Sleet, LCSW Phone Number: 08/13/2023, 1:50 PM  Clinical Narrative:    Pt agreeable to having HH arranged and does not have preference for an HHA. HHPT has been arranged with [facility]. HH orders will need to be placed prior to discharge. Pt also recommended for a rollator. Pt reports having a RW at home. CSW discussed difference between RW and rollator and reiterated PT's recommendation for pt to use rollator. Pt declined using rollator and declined to have DME ordered.    Expected Discharge Plan: Home w Home Health Services Barriers to Discharge: No Barriers Identified  Expected Discharge Plan and Services In-house Referral: Clinical Social Work Discharge Planning Services: NA Post Acute Care Choice: Home Health Living arrangements for the past 2 months: Apartment                 DME Arranged: N/A DME Agency: NA       HH Arranged: PT HH AgencyHotel manager Home Health Care Date HH Agency Contacted: 08/13/23 Time HH Agency Contacted: 1350 Representative spoke with at [facility] Agency: Cindie   Social Determinants of Health (SDOH) Interventions SDOH Screenings   Tobacco Use: Low Risk  (10/18/2022)    Readmission Risk Interventions     No data to display

## 2023-08-13 NOTE — Progress Notes (Signed)
 MEWS Progress Note  Patient Details Name: Elizabeth [name] MRN: [id number] DOB: 1945-03-26 Today's Date: 08/13/2023   MEWS Flowsheet Documentation:  Assess: MEWS Score Temp: 98.5 F (36.9 C) BP: (!) 145/57 MAP (mmHg): 84 Pulse Rate: 66 ECG Heart Rate: 69 Resp: (!) 28 Level of Consciousness: Alert SpO2: 95 % O2 Device: Room Air Assess: MEWS Score MEWS Temp: 0 MEWS Systolic: 0 MEWS Pulse: 0 MEWS RR: 2 MEWS LOC: 0 MEWS Score: 2 MEWS Score Color: Yellow Assess: SIRS CRITERIA SIRS Temperature : 0 SIRS Respirations : 1 SIRS Pulse: 0 SIRS WBC: 0 SIRS Score Sum : 1 Assess: if the MEWS score is Yellow or Red Were vital signs accurate and taken at a resting state?: Yes Does the patient meet 2 or more of the SIRS criteria?: No MEWS guidelines implemented : No, previously yellow, continue vital signs every 4 hours        Londell River 08/13/2023, 11:37 AM

## 2023-08-13 NOTE — Plan of Care (Signed)

## 2023-08-13 NOTE — Progress Notes (Signed)
**Note Elizabeth-Identified via Obfuscation**  [facility]  PROGRESS NOTE  Elizabeth [name] VHQ:[id number] DOB: [birth year] DOA: 08/11/2023 PCP: Tretha Fu, MD   Brief HPI:    78 y.o. female with medical history significant of anxiety, remote breast cancer, prior endometrial carcinoma gastroesophageal reflux disease, and hypertension  who presents to the emergency department after having been on the floor for an unknown period of time.  The patient's sister called her today and the patient stated she could not get up.  The patient's sister called the patient's daughter who then notified 911.  EMS found the patient unable to get off the floor but surrounded by pillows.  She did not have any significant pain.  She however did have some episodes of vomiting and fevers and chills at home with a temperature of 101 point per the daughter.  She took Tylenol  which improved.  She has been weaker than normal over the past few days.  She has had some more where she gets winded with conversations.  She had a remote workup for sleep apnea but they were unable to get an accurate sleep study due to the patient's rapid respiratory rate.  (Perhaps related to her history of schizophrenia?).  She does have a obesity which may be contributing to her sense of being winded.  She is followed by Dr. Ganji for heart failure with preserved ejection fraction.  Last echocardiogram in 2023.  She has no chest pain no abdominal pain but her legs are more swollen than normal.   ED Course: Mildly elevated white blood cell, CT K of 2025, ischial troponin of 68 with repeat of approximately the same, P of 324, tachypnea with maintained oxygen saturation which may be her baseline, and CT scan which showed an abnormal mass in the gallbladder stones versus could not be ruled out.  She is therefore being admitted for further evaluation and management.   Assessment/Plan:    Rhabdomyolysis -Likely from being on the floor; CPK was elevated at 2025, improved to 690 with IV  fluids -Will discontinue IV fluids as patient is not developing fluid overload  Acute on chronic diastolic CHF -Patient has history of diastolic CHF and takes Lasix  at home. - Was given IV fluids as above for rhabdomyolysis, now developing volume overload. - Will discontinue IV fluids and give 1 dose of Lasix  20 mg IV  Group B strep bacteremia - Blood cultures growing group B strep, Streptococcus agalactiae - Started on penicillin  Left lower extremity cellulitis - Left lower extremity is tender to palpation with mild erythema - Concern for cellulitis -Started on penicillin G - Venous duplex of left lower extremity was negative for DVT  Gallbladder mass - Seen on CT abdomen/pelvis - Ultrasound of abdomen confirms 1.8 cm solid-appearing irregular mass like region within the gallbladder body - Recommend to get dedicated MRI liver with and without contrast when stable - Discussed with general surgery, will get MRCP while in the hospital and can follow-up with general surgery as outpatient.  Nausea/vomiting - Continue Zofran  as needed  Fall/weakness - PT evaluation      Medications     enoxaparin  (LOVENOX ) injection  40 mg Subcutaneous Q24H     Data Reviewed:   CBG:  No results for input(s): "GLUCAP" in the last 168 hours.  SpO2: 100 %    Vitals:   08/12/23 2031 08/13/23 0447 08/13/23 0825 08/13/23 1233  BP: (!) 133/56 (!) 133/54 (!) 145/57 (!) 172/75  Pulse: 67 67 66 61  Resp: (!) 28 (!) 26 (!)  28 (!) 25  Temp: 98.3 F (36.8 C) 98.2 F (36.8 C) 98.5 F (36.9 C) 98 F (36.7 C)  TempSrc:   Oral   SpO2: 100% 98% 95% 100%  Weight:      Height:          Data Reviewed:  Basic Metabolic Panel: Recent Labs  Lab 08/11/23 1253 08/12/23 0557 08/13/23 0817  NA 133* 134* 137  K 3.8 3.7 3.9  CL 101 104 107  CO2 22 22 23   GLUCOSE 123* 90 116*  BUN 23 21 18   CREATININE 1.16* 1.09* 0.92  CALCIUM 8.7* 8.5* 8.5*    CBC: Recent Labs  Lab 08/11/23 1253  08/12/23 0557 08/13/23 0817  WBC 12.0* 8.3 7.0  HGB 10.6* 10.3* 10.5*  HCT 32.6* 33.6* 33.6*  MCV 90.8 95.5 92.6  PLT 157 147* 142*    LFT Recent Labs  Lab 08/11/23 1253 08/13/23 0817  AST 56* 33  ALT 34 33  ALKPHOS 69 63  BILITOT 1.1 0.6  PROT 7.2 7.0  ALBUMIN 3.6 3.3*     Antibiotics: Anti-infectives (From admission, onward)    Start     Dose/Rate Route Frequency Ordered Stop   08/12/23 1800  penicillin G potassium 12 Million Units in dextrose  5 % 500 mL CONTINUOUS infusion        12 Million Units 41.7 mL/hr over 12 Hours Intravenous Every 12 hours 08/12/23 1657     08/12/23 1100  amoxicillin-clavulanate (AUGMENTIN) 875-125 MG per tablet 1 tablet  Status:  Discontinued        1 tablet Oral Every 12 hours 08/12/23 1001 08/12/23 1657        DVT prophylaxis: Lovenox   Code Status: Full code  Family Communication: No family at bedside   CONSULTS    Subjective   Leg pain has improved.  Complains of mild shortness of breath.  Patient takes Lasix  20 mg daily at home.  Objective    Physical Examination:  General-appears in no acute distress Heart-S1-S2, regular, no murmur auscultated Lungs-decreased breath sounds at lung bases, scattered wheezing Abdomen-soft, nontender, no organomegaly Extremities-no edema in the lower extremities Neuro-alert, oriented x3, no focal deficit noted  Status is: Inpatient:             Ozell Blunt   [facility] If 7PM-7AM, please contact night-coverage at [email or web address], Office  [phone]   08/13/2023, 3:57 PM  LOS: 1 day

## 2023-08-14 ENCOUNTER — Other Ambulatory Visit (HOSPITAL_COMMUNITY): Payer: Self-pay

## 2023-08-14 DIAGNOSIS — R531 Weakness: Secondary | ICD-10-CM

## 2023-08-14 DIAGNOSIS — W19XXXA Unspecified fall, initial encounter: Secondary | ICD-10-CM

## 2023-08-14 DIAGNOSIS — Y92009 Unspecified place in unspecified non-institutional (private) residence as the place of occurrence of the external cause: Secondary | ICD-10-CM

## 2023-08-14 DIAGNOSIS — K828 Other specified diseases of gallbladder: Secondary | ICD-10-CM | POA: Diagnosis not present

## 2023-08-14 DIAGNOSIS — T796XXS Traumatic ischemia of muscle, sequela: Secondary | ICD-10-CM | POA: Diagnosis not present

## 2023-08-14 LAB — BASIC METABOLIC PANEL WITH GFR
Anion gap: 7 (ref 5–15)
BUN: 18 mg/dL (ref 8–23)
CO2: 24 mmol/L (ref 22–32)
Calcium: 8.6 mg/dL — ABNORMAL LOW (ref 8.9–10.3)
Chloride: 103 mmol/L (ref 98–111)
Creatinine, Ser: 1.21 mg/dL — ABNORMAL HIGH (ref 0.44–1.00)
GFR, Estimated: 46 mL/min — ABNORMAL LOW (ref >60)
Glucose, Bld: 119 mg/dL — ABNORMAL HIGH (ref 70–99)
Potassium: 4.2 mmol/L (ref 3.5–5.1)
Sodium: 134 mmol/L — ABNORMAL LOW (ref 135–145)

## 2023-08-14 LAB — CULTURE, BLOOD (ROUTINE X 2): Special Requests: ADEQUATE

## 2023-08-14 LAB — CK: Total CK: 342 U/L — ABNORMAL HIGH (ref 38–234)

## 2023-08-14 LAB — CANCER ANTIGEN 19-9: CA 19-9: 5 U/mL (ref 0–35)

## 2023-08-14 MED ORDER — AMOXICILLIN 500 MG PO CAPS
1000.0000 mg | ORAL_CAPSULE | Freq: Three times a day (TID) | ORAL | 0 refills | Status: AC
  Filled 2023-08-14: qty 30, 5d supply, fill #0

## 2023-08-14 NOTE — Plan of Care (Signed)

## 2023-08-14 NOTE — Progress Notes (Signed)
 Spoke to patient and daughter, who was at the bedside, regarding follow up with Dr. Leighton Punches next Monday regarding this gallbladder mass noted on imaging.  CA 19-9 is in process.  If the patient does not discharge prior to Monday, please let us  know so we can reschedule this appointment.  [name] 10:35 AM 08/14/2023

## 2023-08-14 NOTE — TOC Transition Note (Signed)
 Transition of Care [facility]) - Discharge Note   Patient Details  Name: Elizabeth [name] MRN: [id number] Date of Birth: 06-25-1945  [facility]) CM/SW Contact:  Marty Sleet, LCSW Phone Number: 08/14/2023, 1:06 PM   Clinical Narrative:    Pt to return home with home health PT through [facility]. HH order in place. No further TOC needs identified.    Final next level of care: Home w Home Health Services Barriers to Discharge: No Barriers Identified   Patient Goals and CMS Choice Patient states their goals for this hospitalization and ongoing recovery are:: return home CMS Medicare.gov Compare Post Acute Care list provided to:: Patient Choice offered to / list presented to : Patient [place] ownership interest in [facility].provided to::  (NA)    Discharge Placement                       Discharge Plan and Services Additional resources added to the After Visit Summary for   In-house Referral: Clinical Social Work Discharge Planning Services: NA Post Acute Care Choice: Home Health          DME Arranged: N/A DME Agency: NA       HH Arranged: PT HH Agency: [facility] Date [facility] Agency Contacted: 08/13/23 Time HH Agency Contacted: 1350 Representative spoke with at [facility] Agency: Cindie  Social Drivers of Health (SDOH) Interventions SDOH Screenings   Food Insecurity: No Food Insecurity (08/13/2023)  Housing: Low Risk  (08/13/2023)  Transportation Needs: No Transportation Needs (08/13/2023)  Utilities: Not At Risk (08/13/2023)  Social Connections: Moderately Isolated (08/13/2023)  Tobacco Use: Low Risk  (10/18/2022)     Readmission Risk Interventions    08/14/2023    1:06 PM  Readmission Risk Prevention Plan  Transportation Screening Complete  PCP or Specialist Appt within 5-7 Days Complete  Home Care Screening Complete  Medication Review (RN CM) Complete

## 2023-08-14 NOTE — Progress Notes (Signed)
 TOC meds in a secure bag delivered to pt in room by this RN.

## 2023-08-14 NOTE — Plan of Care (Signed)

## 2023-08-14 NOTE — Discharge Summary (Signed)
 Physician Discharge Summary   Patient: Elizabeth [name] MRN: [id number] DOB: 1946-02-13  Admit date:     08/11/2023  Discharge date: 08/14/23  Discharge Physician: Ozell Blunt   PCP: Tretha Fu, MD   Recommendations at discharge:   Follow-up general surgery as outpatient Follow-up PCP in 1 week Take amoxicillin 1 g p.o. 3 times daily for 5 more days  Discharge Diagnoses: Principal Problem:   Rhabdomyolysis Active Problems:   Gallbladder mass   Nausea and vomiting   Weakness   Fever   Fall at home, initial encounter  Resolved Problems:   * No resolved hospital problems. *  Hospital Course:  78 y.o. female with medical history significant of anxiety, remote breast cancer, prior endometrial carcinoma gastroesophageal reflux disease, and hypertension  who presents to the emergency department after having been on the floor for an unknown period of time.  The patient's sister called her today and the patient stated she could not get up.  The patient's sister called the patient's daughter who then notified 911.  EMS found the patient unable to get off the floor but surrounded by pillows.  She did not have any significant pain.  She however did have some episodes of vomiting and fevers and chills at home with a temperature of 101 point per the daughter.  She took Tylenol  which improved.  She has been weaker than normal over the past few days.  She has had some more where she gets winded with conversations.  She had a remote workup for sleep apnea but they were unable to get an accurate sleep study due to the patient's rapid respiratory rate.  (Perhaps related to her history of schizophrenia?).  She does have a obesity which may be contributing to her sense of being winded.  She is followed by Dr. Ganji for heart failure with preserved ejection fraction.  Last echocardiogram in 2023.  She has no chest pain no abdominal pain but her legs are more swollen than normal.   ED Course: Mildly  elevated white blood cell, CT K of 2025, ischial troponin of 68 with repeat of approximately the same, P of 324, tachypnea with maintained oxygen saturation which may be her baseline, and CT scan which showed an abnormal mass in the gallbladder stones versus could not be ruled out.  She is therefore being admitted for further evaluation and management.    Assessment and Plan:  Rhabdomyolysis -Likely from being on the floor; CPK was elevated at 2025, improved to 342  with IV fluids IV fluids were discontinued   Acute on chronic diastolic CHF -Patient has history of diastolic CHF and takes Lasix  at home. - Was given IV fluids as above for rhabdomyolysis, now developing volume overload. - Received Lasix  20 mg IV x 1 due to volume overload -Takes Lasix  20 mg daily at home   Group B strep bacteremia - Blood cultures growing group B strep, Streptococcus agalactiae - Started on penicillin -Will discharge on amoxicillin 1 g p.o. 3 times daily for 5 more days   Left lower extremity cellulitis - Left lower extremity is tender to palpation with mild erythema - Concern for cellulitis -Started on penicillin G - Venous duplex of left lower extremity was negative for DVT -Will discharge on amoxicillin as above   Gallbladder mass - Seen on CT abdomen/pelvis - Ultrasound of abdomen confirms 1.8 cm solid-appearing irregular mass like region within the gallbladder body - Recommend to get dedicated MRI liver with and without contrast  when stable - Discussed with general surgery, MRCP obtained shows gallbladder mass. -General Surgery to follow-up in the office on Monday, 08/20/2023  Nausea/vomiting - Resolved   Fall/weakness - PT evaluation obtained, patient to get home health PT         Consultants:  Procedures performed: Venous duplex of lower extremity Disposition: Home Diet recommendation:  Discharge Diet Orders (From admission, onward)     Start     Ordered   08/14/23 0000  Diet -  low sodium heart healthy        08/14/23 1255           Regular diet DISCHARGE MEDICATION: Allergies as of 08/14/2023       Reactions   Amlodipine     Severe edema, SOB, weight gain, rash   Ciprofloxacin Itching   Ofloxacin Swelling   Sulfamethoxazole-trimethoprim Swelling   Benztropine Rash   Patients daughter stated this medication gave the patient a rash the last time she took it.        Medication List     STOP taking these medications    labetalol  100 MG tablet Commonly known as: NORMODYNE        TAKE these medications    acetaminophen  500 MG tablet Commonly known as: TYLENOL  Take 1,000 mg by mouth every 6 (six) hours as needed for mild pain (pain score 1-3) or moderate pain (pain score 4-6).   ALKA-SELTZER PO Take 1 tablet by mouth as needed.   amoxicillin 500 MG capsule Commonly known as: AMOXIL Take 2 capsules (1,000 mg total) by mouth 3 (three) times daily for 5 days.   ARIPiprazole 15 MG tablet Commonly known as: ABILIFY Take 15 mg by mouth at bedtime.   bismuth subsalicylate 262 MG/15ML suspension Commonly known as: PEPTO BISMOL Take 30 mLs by mouth every 6 (six) hours as needed for indigestion.   clotrimazole-betamethasone cream Commonly known as: LOTRISONE Apply 1 Application topically 2 (two) times daily.   cyanocobalamin 1000 MCG tablet Commonly known as: VITAMIN B12 Take 1,000 mcg by mouth daily.   escitalopram 20 MG tablet Commonly known as: LEXAPRO Take 20 mg by mouth at bedtime.   fluticasone 50 MCG/ACT nasal spray Commonly known as: FLONASE Place 1 spray into both nostrils as needed for allergies.   furosemide  20 MG tablet Commonly known as: LASIX  Take 20 mg by mouth daily.   lisinopril  10 MG tablet Commonly known as: ZESTRIL  Take 10 mg by mouth daily.   methylphenidate 10 MG tablet Commonly known as: RITALIN Take 10 mg by mouth 2 (two) times daily.   rosuvastatin 20 MG tablet Commonly known as: CRESTOR Take 20 mg  by mouth daily.   spironolactone 25 MG tablet Commonly known as: ALDACTONE Take 25 mg by mouth 2 (two) times daily.   Ventolin HFA 108 (90 Base) MCG/ACT inhaler Generic drug: albuterol Inhale 2 puffs into the lungs every 6 (six) hours as needed for wheezing or shortness of breath.   Vitamin D 125 MCG (5000 UT) Caps Take 5,000 Units by mouth daily.        Follow-up Information     [facility] Office Follow up.   Specialty: Home Health Services Why: Gasper Karst will follow up with you at discharge to provide home health physical therapy Contact information: [address] [place]  [address] [phone]        Lujean Sake, MD Follow up on 08/20/2023.   Specialty: General Surgery Why: 1:30pm, Arrive 30 minutes  prior to your appointment time, Please bring your insurance card and photo ID.  This is for follow up of your gallbladder mass. Contact information: [address] [place] [address] [phone]         Tretha Fu, MD Follow up in 1 week(s).   Specialty: Internal Medicine Contact information: [address] [place] [address] [phone]                Discharge Exam: Cleavon Curls Weights   08/12/23 1246  Weight: 101.3 kg   General-appears in no acute distress Heart-S1-S2, regular, no murmur auscultated Lungs-clear to auscultation bilaterally, no wheezing or crackles auscultated Abdomen-soft, nontender, no organomegaly Extremities-no edema in the lower extremities Neuro-alert, oriented x3, no focal deficit noted  Condition at discharge: good  The results of significant diagnostics from this hospitalization (including imaging, microbiology, ancillary and laboratory) are listed below for reference.   Imaging Studies: MR ABDOMEN MRCP W WO CONTAST Result Date: 08/13/2023 CLINICAL DATA:  Gallbladder, biliary cancer staging, suspected gallbladder mass by CT EXAM: MRI  ABDOMEN WITHOUT AND WITH CONTRAST (INCLUDING MRCP) TECHNIQUE: Multiplanar multisequence MR imaging of the abdomen was performed both before and after the administration of intravenous contrast. Heavily T2-weighted images of the biliary and pancreatic ducts were obtained, and three-dimensional MRCP images were rendered by post processing. CONTRAST:  10mL GADAVIST GADOBUTROL 1 MMOL/ML IV SOLN COMPARISON:  CT abdomen pelvis, 08/11/2022 FINDINGS: Lower chest: No acute abnormality. Hepatobiliary: No solid liver abnormality is seen. Brightly enhancing endoluminal mass in the central gallbladder measuring 3.5 x 2.1 cm (series 14, image 38). No gallstones. No biliary ductal dilatation. Pancreas: Unremarkable. No pancreatic ductal dilatation or surrounding inflammatory changes. Spleen: Normal in size without significant abnormality. Adrenals/Urinary Tract: Adrenal glands are unremarkable. Kidneys are normal, without renal calculi, solid lesion, or hydronephrosis. Stomach/Bowel: Stomach is within normal limits. No evidence of bowel wall thickening, distention, or inflammatory changes. Vascular/Lymphatic: No significant vascular findings are present. No enlarged abdominal lymph nodes. Other: No abdominal wall hernia or abnormality. No ascites. Musculoskeletal: No acute or significant osseous findings. IMPRESSION: 1. Brightly enhancing endoluminal mass in the central gallbladder measuring 3.5 x 2.1 cm, highly concerning for gallbladder malignancy. Inflammatory pseudotumor would be a general differential consideration. 2. No evidence of liver parenchymal involvement or hepatic metastatic disease. 3. No biliary ductal dilatation. 4. No evidence of lymphadenopathy or metastatic disease in the abdomen. Electronically Signed   By: Fredricka Jenny M.D.   On: 08/13/2023 20:12   MR 3D Recon At Scanner Result Date: 08/13/2023 CLINICAL DATA:  Gallbladder, biliary cancer staging, suspected gallbladder mass by CT EXAM: MRI ABDOMEN WITHOUT  AND WITH CONTRAST (INCLUDING MRCP) TECHNIQUE: Multiplanar multisequence MR imaging of the abdomen was performed both before and after the administration of intravenous contrast. Heavily T2-weighted images of the biliary and pancreatic ducts were obtained, and three-dimensional MRCP images were rendered by post processing. CONTRAST:  10mL GADAVIST GADOBUTROL 1 MMOL/ML IV SOLN COMPARISON:  CT abdomen pelvis, 08/11/2022 FINDINGS: Lower chest: No acute abnormality. Hepatobiliary: No solid liver abnormality is seen. Brightly enhancing endoluminal mass in the central gallbladder measuring 3.5 x 2.1 cm (series 14, image 38). No gallstones. No biliary ductal dilatation. Pancreas: Unremarkable. No pancreatic ductal dilatation or surrounding inflammatory changes. Spleen: Normal in size without significant abnormality. Adrenals/Urinary Tract: Adrenal glands are unremarkable. Kidneys are normal, without renal calculi, solid lesion, or hydronephrosis. Stomach/Bowel: Stomach is within normal limits. No evidence of bowel wall thickening, distention, or inflammatory changes.  Vascular/Lymphatic: No significant vascular findings are present. No enlarged abdominal lymph nodes. Other: No abdominal wall hernia or abnormality. No ascites. Musculoskeletal: No acute or significant osseous findings. IMPRESSION: 1. Brightly enhancing endoluminal mass in the central gallbladder measuring 3.5 x 2.1 cm, highly concerning for gallbladder malignancy. Inflammatory pseudotumor would be a general differential consideration. 2. No evidence of liver parenchymal involvement or hepatic metastatic disease. 3. No biliary ductal dilatation. 4. No evidence of lymphadenopathy or metastatic disease in the abdomen. Electronically Signed   By: Fredricka Jenny M.D.   On: 08/13/2023 20:12   VAS US  LOWER EXTREMITY VENOUS (DVT) Result Date: 08/13/2023  Lower Venous DVT Study Patient Name:  PAYTAN RECINE Gillison  Date of Exam:   08/13/2023 Medical Rec #: [id number]         Accession #:    [id number] Date of Birth: Nov 04, 1945        Patient Gender: F Patient Age:   78 years Exam Location:  [facility] Procedure:      VAS US  LOWER EXTREMITY VENOUS (DVT) Referring Phys: Maple Seltzer Kahli Fitzgerald --------------------------------------------------------------------------------  Indications: Pain.  Risk Factors: None identified. Limitations: Poor ultrasound/tissue interface. Comparison Study: No prior studies. Performing Technologist: Lerry Ransom RVT  Examination Guidelines: A complete evaluation includes B-mode imaging, spectral Doppler, color Doppler, and power Doppler as needed of all accessible portions of each vessel. Bilateral testing is considered an integral part of a complete examination. Limited examinations for reoccurring indications may be performed as noted. The reflux portion of the exam is performed with the patient in reverse Trendelenburg.  +-----+---------------+---------+-----------+----------+--------------+ RIGHTCompressibilityPhasicitySpontaneityPropertiesThrombus Aging +-----+---------------+---------+-----------+----------+--------------+ CFV  Full           Yes      Yes                                 +-----+---------------+---------+-----------+----------+--------------+   +---------+---------------+---------+-----------+----------+--------------+ LEFT     CompressibilityPhasicitySpontaneityPropertiesThrombus Aging +---------+---------------+---------+-----------+----------+--------------+ CFV      Full           Yes      Yes                                 +---------+---------------+---------+-----------+----------+--------------+ SFJ      Full                                                        +---------+---------------+---------+-----------+----------+--------------+ FV Prox  Full                                                        +---------+---------------+---------+-----------+----------+--------------+ FV Mid    Full                                                        +---------+---------------+---------+-----------+----------+--------------+ FV Distal               Yes  Yes                                 +---------+---------------+---------+-----------+----------+--------------+ PFV      Full                                                        +---------+---------------+---------+-----------+----------+--------------+ POP      Full           Yes      Yes                                 +---------+---------------+---------+-----------+----------+--------------+ PTV      Full                                                        +---------+---------------+---------+-----------+----------+--------------+ PERO     Full                                                        +---------+---------------+---------+-----------+----------+--------------+    Summary: RIGHT: - No evidence of common femoral vein obstruction.   LEFT: - There is no evidence of deep vein thrombosis in the lower extremity. However, portions of this examination were limited- see technologist comments above.  - No cystic structure found in the popliteal fossa.  *See table(s) above for measurements and observations. Electronically signed by Genny Kid MD on 08/13/2023 at 11:11:55 AM.    Final    US  Abdomen Limited RUQ (LIVER/GB) Result Date: 08/11/2023 CLINICAL DATA:  Abnormal gallbladder on recent CT, history of weakness and shortness of breath EXAM: ULTRASOUND ABDOMEN LIMITED RIGHT UPPER QUADRANT COMPARISON:  08/11/2023 FINDINGS: Gallbladder: The gallbladder is moderately distended. An irregular 1.8 x 1.6 x 1.8 cm solid-appearing mass is identified within the mid aspect of the gallbladder body, corresponding to CT findings. Internal vascularity is seen on color Doppler imaging. Underlying gallbladder mass and neoplasm is suspected, and further evaluation with nonemergent dedicated liver MRI with and  without contrast is recommended when the patient is able to follow breath hold instructions, preferably is outpatient. No evidence of calcified gallstones or acute cholecystitis. No pericholecystic fluid. Negative sonographic Murphy sign. Common bile duct: Diameter: 6 mm Liver: No focal lesion identified. Within normal limits in parenchymal echogenicity. Portal vein is patent on color Doppler imaging with normal direction of blood flow towards the liver. Other: Evaluation is limited due to patient body habitus and respiratory motion throughout the study. IMPRESSION: 1. 1.8 cm solid-appearing irregular mass like region within the gallbladder body, corresponding to CT findings, concerning for underlying neoplasm. When the patient is clinically stable and able to follow directions and hold their breath (preferably as an outpatient) further evaluation with dedicated abdominal MRI should be considered. 2. Otherwise no acute findings. Electronically Signed   By: Bobbye Burrow M.D.   On: 08/11/2023 17:17   CT ABDOMEN PELVIS W  CONTRAST Result Date: 08/11/2023 CLINICAL DATA:  Weakness, shortness of breath * Tracking Code: BO * EXAM: CT ANGIOGRAPHY CHEST CT ABDOMEN AND PELVIS WITH CONTRAST TECHNIQUE: Multidetector CT imaging of the chest was performed using the standard protocol during bolus administration of intravenous contrast. Multiplanar CT image reconstructions and MIPs were obtained to evaluate the vascular anatomy. Multidetector CT imaging of the abdomen and pelvis was performed using the standard protocol during bolus administration of intravenous contrast. RADIATION DOSE REDUCTION: This exam was performed according to the departmental dose-optimization program which includes automated exposure control, adjustment of the mA and/or kV according to patient size and/or use of iterative reconstruction technique. CONTRAST:  OMNIPAQUE  IOHEXOL  350 MG/ML SOLN COMPARISON:  None Available. FINDINGS: CT CHEST ANGIOGRAM  FINDINGS Cardiovascular: Satisfactory opacification of the pulmonary arteries to the segmental level. No evidence of pulmonary embolism. Cardiomegaly. Left coronary artery calcifications. No pericardial effusion. Mediastinum/Nodes: No enlarged mediastinal, hilar, or axillary lymph nodes. Thyroid  gland, trachea, and esophagus demonstrate no significant findings. Lungs/Pleura: Trace bilateral pleural effusions. Musculoskeletal: No chest wall abnormality. No acute osseous findings. Review of the MIP images confirms the above findings. CT ABDOMEN PELVIS FINDINGS Hepatobiliary: No solid liver abnormality is seen. Unusual, masslike lesion in the central gallbladder measuring 3.1 x 1.9 cm (series 2, image 29). No gallbladder wall thickening, or biliary dilatation. Pancreas: Unremarkable. No pancreatic ductal dilatation or surrounding inflammatory changes. Spleen: Normal in size without significant abnormality. Adrenals/Urinary Tract: Adrenal glands are unremarkable. Kidneys are normal, without renal calculi, solid lesion, or hydronephrosis. Bladder is unremarkable. Stomach/Bowel: Stomach is within normal limits. Appendix appears normal. No evidence of bowel wall thickening, distention, or inflammatory changes. Vascular/Lymphatic: Scattered aortic atherosclerosis. No enlarged abdominal or pelvic lymph nodes. Reproductive: Status post hysterectomy. Other: No abdominal wall hernia or abnormality. No ascites. Musculoskeletal: No acute or significant osseous findings. IMPRESSION: 1. Negative examination for pulmonary embolism. 2. Trace bilateral pleural effusions. 3. Unusual, masslike lesion in the central gallbladder measuring 3.1 x 1.9 cm. This may reflect a conglomeration of gallstones focally contracted within the gallbladder but mass is difficult to exclude with this appearance. On a nonemergent, outpatient basis, consider ultrasound for initial further assessment with the possibility of contrast enhanced MRI to exclude  soft tissue mass. 4. Cardiomegaly and coronary artery disease. Aortic Atherosclerosis (ICD10-I70.0). Electronically Signed   By: Fredricka Jenny M.D.   On: 08/11/2023 14:58   CT Angio Chest PE W and/or Wo Contrast Result Date: 08/11/2023 CLINICAL DATA:  Weakness, shortness of breath * Tracking Code: BO * EXAM: CT ANGIOGRAPHY CHEST CT ABDOMEN AND PELVIS WITH CONTRAST TECHNIQUE: Multidetector CT imaging of the chest was performed using the standard protocol during bolus administration of intravenous contrast. Multiplanar CT image reconstructions and MIPs were obtained to evaluate the vascular anatomy. Multidetector CT imaging of the abdomen and pelvis was performed using the standard protocol during bolus administration of intravenous contrast. RADIATION DOSE REDUCTION: This exam was performed according to the departmental dose-optimization program which includes automated exposure control, adjustment of the mA and/or kV according to patient size and/or use of iterative reconstruction technique. CONTRAST:  OMNIPAQUE  IOHEXOL  350 MG/ML SOLN COMPARISON:  None Available. FINDINGS: CT CHEST ANGIOGRAM FINDINGS Cardiovascular: Satisfactory opacification of the pulmonary arteries to the segmental level. No evidence of pulmonary embolism. Cardiomegaly. Left coronary artery calcifications. No pericardial effusion. Mediastinum/Nodes: No enlarged mediastinal, hilar, or axillary lymph nodes. Thyroid  gland, trachea, and esophagus demonstrate no significant findings. Lungs/Pleura: Trace bilateral pleural effusions. Musculoskeletal: No chest wall abnormality. No acute  osseous findings. Review of the MIP images confirms the above findings. CT ABDOMEN PELVIS FINDINGS Hepatobiliary: No solid liver abnormality is seen. Unusual, masslike lesion in the central gallbladder measuring 3.1 x 1.9 cm (series 2, image 29). No gallbladder wall thickening, or biliary dilatation. Pancreas: Unremarkable. No pancreatic ductal dilatation or  surrounding inflammatory changes. Spleen: Normal in size without significant abnormality. Adrenals/Urinary Tract: Adrenal glands are unremarkable. Kidneys are normal, without renal calculi, solid lesion, or hydronephrosis. Bladder is unremarkable. Stomach/Bowel: Stomach is within normal limits. Appendix appears normal. No evidence of bowel wall thickening, distention, or inflammatory changes. Vascular/Lymphatic: Scattered aortic atherosclerosis. No enlarged abdominal or pelvic lymph nodes. Reproductive: Status post hysterectomy. Other: No abdominal wall hernia or abnormality. No ascites. Musculoskeletal: No acute or significant osseous findings. IMPRESSION: 1. Negative examination for pulmonary embolism. 2. Trace bilateral pleural effusions. 3. Unusual, masslike lesion in the central gallbladder measuring 3.1 x 1.9 cm. This may reflect a conglomeration of gallstones focally contracted within the gallbladder but mass is difficult to exclude with this appearance. On a nonemergent, outpatient basis, consider ultrasound for initial further assessment with the possibility of contrast enhanced MRI to exclude soft tissue mass. 4. Cardiomegaly and coronary artery disease. Aortic Atherosclerosis (ICD10-I70.0). Electronically Signed   By: Fredricka Jenny M.D.   On: 08/11/2023 14:58   DG Chest Portable 1 View Result Date: 08/11/2023 CLINICAL DATA:  Shortness of breath. EXAM: PORTABLE CHEST 1 VIEW COMPARISON:  Chest radiograph dated 04/22/2013. FINDINGS: No focal consolidation, pleural effusion, or pneumothorax. Mild cardiomegaly. No acute osseous pathology. IMPRESSION: 1. No active disease. 2. Mild cardiomegaly. Electronically Signed   By: Angus Bark M.D.   On: 08/11/2023 12:45   CT Head Wo Contrast Result Date: 08/11/2023 CLINICAL DATA:  fall EXAM: CT HEAD WITHOUT CONTRAST TECHNIQUE: Contiguous axial images were obtained from the base of the skull through the vertex without intravenous contrast. RADIATION DOSE  REDUCTION: This exam was performed according to the departmental dose-optimization program which includes automated exposure control, adjustment of the mA and/or kV according to patient size and/or use of iterative reconstruction technique. COMPARISON:  CT head 01/05/2010 FINDINGS: Brain: No evidence of large-territorial acute infarction. Redemonstration of prominent perivesicular space along the inferior aspect of the right basal ganglia. No parenchymal hemorrhage. No mass lesion. No extra-axial collection. No mass effect or midline shift. No hydrocephalus. Basilar cisterns are patent. Vascular: No hyperdense vessel. Skull: No acute fracture or focal lesion. Sinuses/Orbits: Paranasal sinuses and mastoid air cells are clear. The orbits are unremarkable. Other: None. IMPRESSION: No acute intracranial abnormality. Electronically Signed   By: Morgane  Naveau M.D.   On: 08/11/2023 12:45    Microbiology: Results for orders placed or performed during the hospital encounter of 08/11/23  Resp panel by RT-PCR (RSV, Flu A&B, Covid) Anterior Nasal Swab     Status: None   Collection Time: 08/11/23 12:53 PM   Specimen: Anterior Nasal Swab  Result Value Ref Range Status   SARS Coronavirus 2 by RT PCR NEGATIVE NEGATIVE Final    Comment: (NOTE) SARS-CoV-2 target nucleic acids are NOT DETECTED.  The SARS-CoV-2 RNA is generally detectable in upper respiratory specimens during the acute phase of infection. The lowest concentration of SARS-CoV-2 viral copies this assay can detect is 138 copies/mL. A negative result does not preclude SARS-Cov-2 infection and should not be used as the sole basis for treatment or other patient management decisions. A negative result may occur with  improper specimen collection/handling, submission of specimen other than nasopharyngeal swab, presence  of viral mutation(s) within the areas targeted by this assay, and inadequate number of viral copies(<138 copies/mL). A negative result  must be combined with clinical observations, patient history, and epidemiological information. The expected result is Negative.  Fact Sheet for Patients:  BloggerCourse.com  Fact Sheet for Healthcare Providers:  SeriousBroker.it  This test is no t yet approved or cleared by the [place]  FDA and  has been authorized for detection and/or diagnosis of SARS-CoV-2 by FDA under an Emergency Use Authorization (EUA). This EUA will remain  in effect (meaning this test can be used) for the duration of the COVID-19 declaration under Section 564(b)(1) of the Act, 21 U.S.C.section 360bbb-3(b)(1), unless the authorization is terminated  or revoked sooner.       Influenza A by PCR NEGATIVE NEGATIVE Final   Influenza B by PCR NEGATIVE NEGATIVE Final    Comment: (NOTE) The Xpert Xpress SARS-CoV-2/FLU/RSV plus assay is intended as an aid in the diagnosis of influenza from Nasopharyngeal swab specimens and should not be used as a sole basis for treatment. Nasal washings and aspirates are unacceptable for Xpert Xpress SARS-CoV-2/FLU/RSV testing.  Fact Sheet for Patients: BloggerCourse.com  Fact Sheet for Healthcare Providers: SeriousBroker.it  This test is not yet approved or cleared by the [place]  FDA and has been authorized for detection and/or diagnosis of SARS-CoV-2 by FDA under an Emergency Use Authorization (EUA). This EUA will remain in effect (meaning this test can be used) for the duration of the COVID-19 declaration under Section 564(b)(1) of the Act, 21 U.S.C. section 360bbb-3(b)(1), unless the authorization is terminated or revoked.     Resp Syncytial Virus by PCR NEGATIVE NEGATIVE Final    Comment: (NOTE) Fact Sheet for Patients: BloggerCourse.com  Fact Sheet for Healthcare Providers: SeriousBroker.it  This test is  not yet approved or cleared by the [place]  FDA and has been authorized for detection and/or diagnosis of SARS-CoV-2 by FDA under an Emergency Use Authorization (EUA). This EUA will remain in effect (meaning this test can be used) for the duration of the COVID-19 declaration under Section 564(b)(1) of the Act, 21 U.S.C. section 360bbb-3(b)(1), unless the authorization is terminated or revoked.  Performed at [facility], [address] [place], [place] [address]   Blood culture (routine x 2)     Status: Abnormal   Collection Time: 08/11/23  2:01 PM   Specimen: BLOOD  Result Value Ref Range Status   Specimen Description   Final    BLOOD SITE NOT SPECIFIED Performed at [facility], [address], [place], [place] [address]    Special Requests   Final    BOTTLES DRAWN AEROBIC AND ANAEROBIC Blood Culture adequate volume Performed at [facility], [address], [place], [place] [address]    Culture  Setup Time   Final    GRAM POSITIVE COCCI ANAEROBIC BOTTLE ONLY CRITICAL RESULT CALLED TO, READ BACK BY AND VERIFIED WITH: PHARMD MICHELLE BELL [phone] [id number] BY Chestine Costain, [place] Performed at [facility], [address], [place], [place] [address]    Culture GROUP B STREP(S.AGALACTIAE)ISOLATED (A)  Final   Report Status 08/14/2023 FINAL  Final   Organism ID, Bacteria GROUP B STREP(S.AGALACTIAE)ISOLATED  Final      Susceptibility   Group b strep(s.agalactiae)isolated - MIC*    CLINDAMYCIN RESISTANT Resistant     AMPICILLIN <=0.25 SENSITIVE Sensitive     ERYTHROMYCIN 2 RESISTANT Resistant     VANCOMYCIN 0.5 SENSITIVE Sensitive  CEFTRIAXONE <=0.12 SENSITIVE Sensitive     LEVOFLOXACIN 0.5 SENSITIVE Sensitive     PENICILLIN <=0.06 SENSITIVE Sensitive     * GROUP B STREP(S.AGALACTIAE)ISOLATED  Blood Culture ID Panel (Reflexed)     Status: Abnormal   Collection Time: 08/11/23  2:01 PM  Result Value Ref Range Status   Enterococcus  faecalis NOT DETECTED NOT DETECTED Final   Enterococcus Faecium NOT DETECTED NOT DETECTED Final   Listeria monocytogenes NOT DETECTED NOT DETECTED Final   Staphylococcus species NOT DETECTED NOT DETECTED Final   Staphylococcus aureus (BCID) NOT DETECTED NOT DETECTED Final   Staphylococcus epidermidis NOT DETECTED NOT DETECTED Final   Staphylococcus lugdunensis NOT DETECTED NOT DETECTED Final   Streptococcus species DETECTED (A) NOT DETECTED Final    Comment: CRITICAL RESULT CALLED TO, READ BACK BY AND VERIFIED WITH: PHARMD MICHELLE BELL [phone] 1650 BY J RAZZAK, MT    Streptococcus agalactiae DETECTED (A) NOT DETECTED Final    Comment: CRITICAL RESULT CALLED TO, READ BACK BY AND VERIFIED WITH: PHARMD MICHELLE BELL [phone] 1650 BY J RAZZAK, MT    Streptococcus pneumoniae NOT DETECTED NOT DETECTED Final   Streptococcus pyogenes NOT DETECTED NOT DETECTED Final   A.calcoaceticus-baumannii NOT DETECTED NOT DETECTED Final   Bacteroides fragilis NOT DETECTED NOT DETECTED Final   Enterobacterales NOT DETECTED NOT DETECTED Final   Enterobacter cloacae complex NOT DETECTED NOT DETECTED Final   Escherichia coli NOT DETECTED NOT DETECTED Final   Klebsiella aerogenes NOT DETECTED NOT DETECTED Final   Klebsiella oxytoca NOT DETECTED NOT DETECTED Final   Klebsiella pneumoniae NOT DETECTED NOT DETECTED Final   Proteus species NOT DETECTED NOT DETECTED Final   Salmonella species NOT DETECTED NOT DETECTED Final   Serratia marcescens NOT DETECTED NOT DETECTED Final   Haemophilus influenzae NOT DETECTED NOT DETECTED Final   Neisseria meningitidis NOT DETECTED NOT DETECTED Final   Pseudomonas aeruginosa NOT DETECTED NOT DETECTED Final   Stenotrophomonas maltophilia NOT DETECTED NOT DETECTED Final   Candida albicans NOT DETECTED NOT DETECTED Final   Candida auris NOT DETECTED NOT DETECTED Final   Candida glabrata NOT DETECTED NOT DETECTED Final   Candida krusei NOT DETECTED NOT DETECTED Final    Candida parapsilosis NOT DETECTED NOT DETECTED Final   Candida tropicalis NOT DETECTED NOT DETECTED Final   Cryptococcus neoformans/gattii NOT DETECTED NOT DETECTED Final    Comment: Performed at [facility], [address] [place], [place] [address]  Culture, blood (Routine X 2) w Reflex to ID Panel     Status: None (Preliminary result)   Collection Time: 08/11/23  2:11 PM   Specimen: BLOOD  Result Value Ref Range Status   Specimen Description   Final    BLOOD RIGHT ANTECUBITAL Performed at [facility], [address], [place], [place] [address]    Special Requests   Final    BOTTLES DRAWN AEROBIC AND ANAEROBIC Blood Culture adequate volume Performed at [facility], [address], [place], [place] [address]    Culture   Final    NO GROWTH 3 DAYS Performed at [facility], [address], [place], [place] [address]    Report Status PENDING  Incomplete  Urine Culture     Status: Abnormal   Collection Time: 08/11/23  2:18 PM   Specimen: Urine, Clean Catch  Result Value Ref Range Status   Specimen Description   Final    URINE, CLEAN CATCH Performed at [facility], [address] [place], [place] [address]    Special Requests   Final    NONE Performed at [facility], [address], [place], [place] [address]    Culture (A)  Final    <10,000 COLONIES/mL INSIGNIFICANT GROWTH Performed at [facility], [address], [place], [place] [address]    Report Status 08/12/2023 FINAL  Final    Labs: CBC: Recent Labs  Lab 08/11/23 1253 08/12/23 0557 08/13/23 0817  WBC 12.0* 8.3 7.0  HGB 10.6* 10.3* 10.5*  HCT 32.6* 33.6* 33.6*  MCV 90.8 95.5 92.6  PLT 157 147* 142*   Basic Metabolic Panel: Recent Labs  Lab 08/11/23 1253 08/12/23 0557 08/13/23 0817 08/14/23 0524  NA 133* 134* 137 134*  K 3.8 3.7 3.9 4.2  CL 101 104 107 103  CO2 22 22 23 24   GLUCOSE 123* 90 116* 119*   BUN 23 21 18 18   CREATININE 1.16* 1.09* 0.92 1.21*  CALCIUM 8.7* 8.5* 8.5* 8.6*   Liver Function Tests: Recent Labs  Lab 08/11/23 1253 08/13/23 0817  AST 56* 33  ALT 34 33  ALKPHOS 69 63  BILITOT 1.1 0.6  PROT 7.2 7.0  ALBUMIN 3.6 3.3*   CBG: No results for input(s): "GLUCAP" in the last 168 hours.  Discharge time spent: greater than 30 minutes.  Signed: Ozell Blunt, MD [facility] 08/14/2023

## 2023-08-15 ENCOUNTER — Telehealth: Payer: Self-pay

## 2023-08-15 DIAGNOSIS — T796XXS Traumatic ischemia of muscle, sequela: Secondary | ICD-10-CM

## 2023-08-15 DIAGNOSIS — K828 Other specified diseases of gallbladder: Secondary | ICD-10-CM

## 2023-08-15 NOTE — Patient Instructions (Signed)
 Visit Information  Thank you for taking time to visit with me today. Please don't hesitate to contact me if I can be of assistance to you before our next scheduled telephone appointment.  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 Discussed plans with patient for ongoing care management follow up and provided patient with direct contact information for care management team Advised patient to Reviewed with daughter importance of reminding patient of using cane with ambulation Provided education to patient re: falls  Social Work referral for resources for senior housing Discussed plans with patient for ongoing care management follow up and provided patient with direct contact information for care management team Advised daughter to call PCP for hospital follow up appointment. Reviewed upcoming appointment with surgeon regarding gallbladder mass Keeping moving and stay active Keep your bones strong Go for regular eye checkups Always stand up slowly Wear proper non-slip footwear Light up your living space Use assistive devices.  Advised daughter to call [facility] to ensure changes with pill packs completed.   Patient Self Care Activities:  Attend all scheduled provider appointments Call pharmacy for medication refills 3-7  days in advance of running out of medications Call provider office for new concerns or questions  Participate in Transition of Care Program/Attend TOC scheduled calls Take medications as prescribed   Work with the social worker to address care coordination needs and will continue to work with the clinical team to address health care and disease management related needs  Plan:  Telephone follow up appointment with care management team member scheduled for:  08/22/23       Falls Prevention Discussed Keeping moving and stay active Keep your bones strong Go for regular eye checkups Always stand up slowly Wear proper non-slip footwear Light up your living space Use assistive devices.    Patient verbalizes understanding of instructions and care plan provided today and agrees to view in MyChart. Active MyChart status and patient understanding of how to access instructions and care plan via MyChart confirmed with patient.     The patient has been provided with contact information for the care management team and has been advised to call with any health related questions or concerns.   Please call the care guide team at [phone] if you need to cancel or reschedule your appointment.   Please call the Suicide and Crisis Lifeline: 988 if you are experiencing a Mental Health or Behavioral Health Crisis or need someone to talk to.  Anysha Frappier J. Kharma Sampsel RN, MSN [facility], [facility] Health RN Care Manager Direct Dial: [phone]  Fax: [phone] Website: [email or web address]

## 2023-08-15 NOTE — Transitions of Care (Post Inpatient/ED Visit) (Signed)
 08/15/2023  Name: Elizabeth [name] MRN: [id number] DOB: [birth year]  Today's TOC FU Call Status: Today's TOC FU Call Status:: Successful TOC FU Call Completed TOC FU Call Complete Date: 08/15/23 Patient's Name and Date of Birth confirmed.  Transition Care Management Follow-up Telephone Call Date of Discharge: 08/14/23 Discharge Facility: [facility]) Type of Discharge: Inpatient Admission Primary Inpatient Discharge Diagnosis:: Rhabdomyolysis How have you been since you were released from the hospital?: Better Any questions or concerns?: No  Items Reviewed: Did you receive and understand the discharge instructions provided?: Yes Medications obtained,verified, and reconciled?: Yes (Medications Reviewed) Any new allergies since your discharge?: No Dietary orders reviewed?: Yes Type of Diet Ordered:: low sodium heart healthy Do you have support at home?: Yes People in Home [RPT]: child(ren), adult Name of Support/Comfort Primary Source: Elizabeth [name]  Medications Reviewed Today: Patient uses pill packs for medication.   Medications Reviewed Today     Reviewed by Jolyssa Oplinger, RN (Case Manager) on 08/15/23 at 1034  Med List Status: <None>   Medication Order Taking? Sig Documenting Provider Last Dose Status Informant  acetaminophen  (TYLENOL ) 500 MG tablet [id number] Yes Take 1,000 mg by mouth every 6 (six) hours as needed for mild pain (pain score 1-3) or moderate pain (pain score 4-6). [provider] Taking Active Self, Family Member, Pharmacy Records  amoxicillin (AMOXIL) 500 MG capsule [id number] Yes Take 2 capsules (1,000 mg total) by mouth 3 (three) times daily for 5 days. Ozell Blunt, MD Taking Active   ARIPiprazole (ABILIFY) 15 MG tablet [id number] Yes Take 15 mg by mouth at bedtime.  [provider] Taking Active Family Member, Self, Pharmacy Records  bismuth subsalicylate (PEPTO BISMOL) 262 MG/15ML suspension [id number] Yes Take 30 mLs by mouth every 6 (six)  hours as needed for indigestion. [provider] Taking Active Self, Family Member, Pharmacy Records  Cholecalciferol (VITAMIN D) 125 MCG (5000 UT) CAPS [id number] Yes Take 5,000 Units by mouth daily. [provider] Taking Active Family Member, Self, Pharmacy Records  clotrimazole-betamethasone (LOTRISONE) cream [id number] Yes Apply 1 Application topically 2 (two) times daily. [provider] Taking Active Self, Family Member, Pharmacy Records  escitalopram (LEXAPRO) 20 MG tablet [id number] Yes Take 20 mg by mouth at bedtime.  [provider] Taking Active Family Member, Self, Pharmacy Records  fluticasone [facility]) 50 MCG/ACT nasal spray [id number] Yes Place 1 spray into both nostrils as needed for allergies. [provider] Taking Active Self, Family Member, Pharmacy Records  furosemide  (LASIX ) 20 MG tablet [id number] Yes Take 20 mg by mouth daily. [provider] Taking Active Self, Family Member, Pharmacy Records  lisinopril  (ZESTRIL ) 10 MG tablet [id number] Yes Take 10 mg by mouth daily. [provider] Taking Active Self, Family Member, Pharmacy Records  methylphenidate (RITALIN) 10 MG tablet [id number] Yes Take 10 mg by mouth 2 (two) times daily. [provider] Taking Active Self, Family Member, Pharmacy Records  rosuvastatin (CRESTOR) 20 MG tablet [id number]  Take 20 mg by mouth daily. [provider]  Active Self, Family Member, Pharmacy Records  Sodium Bicarbonate-Citric Acid (ALKA-SELTZER PO) [id number] Yes Take 1 tablet by mouth as needed. [provider] Taking Active Self, Family Member, Pharmacy Records  spironolactone (ALDACTONE) 25 MG tablet [id number] Yes Take 25 mg by mouth 2 (two) times daily.  [provider] Taking Active Family Member, Self, Pharmacy Records  VENTOLIN HFA [phone] Base) MCG/ACT inhaler [id number] No Inhale 2 puffs into the lungs every 6 (six) hours as needed for  wheezing or  shortness of breath.   Patient not taking: Reported on 08/15/2023   [provider] Not Taking Active Family Member, Self, Pharmacy Records           Med Note Karolynn Pack   EAV Aug 11, 2023  5:15 PM) Patient in need of a refill.  vitamin B-12 (CYANOCOBALAMIN) 1000 MCG tablet [id number] Yes Take 1,000 mcg by mouth daily. [provider] Taking Active Self, Family Member, Pharmacy Records            Home Care and Equipment/Supplies: Were Home Health Services Ordered?: Yes Name of Home Health Agency:: [facility] Has Agency set up a time to come to your home?: Yes First Home Health Visit Date: 08/16/23 Any new equipment or medical supplies ordered?: No  Functional Questionnaire: Do you need assistance with bathing/showering or dressing?: Yes (daughter assists as needed) Do you need assistance with meal preparation?: No Do you need assistance with eating?: No Do you have difficulty maintaining continence: No Do you need assistance with getting out of bed/getting out of a chair/moving?: No Do you have difficulty managing or taking your medications?: No (patient has pill packs from [facility])  Follow up appointments reviewed: PCP Follow-up appointment confirmed?: No (daughter to call PCP) MD Provider Line Number:[phone] Given: No Specialist Hospital Follow-up appointment confirmed?: Yes Date of Specialist follow-up appointment?: 08/20/23 Follow-Up Specialty Provider:: Karleen Overall Do you need transportation to your follow-up appointment?: No Do you understand care options if your condition(s) worsen?: Yes-patient verbalized understanding  SDOH Interventions Today    Flowsheet Row Most Recent Value  SDOH Interventions   Food Insecurity Interventions Intervention Not Indicated  Housing Interventions Intervention Not Indicated  Transportation Interventions Intervention Not Indicated  Utilities Interventions Intervention Not Indicated        Goals Addressed             This Visit's Progress    VBCI Transitions of Care (TOC) Care Plan       Problems:  Recent Hospitalization for treatment of rhabdomyolysis and new gallbladder mass Knowledge Deficit Related to Newly found gallbladder mass and No Hospital Follow Up Provider appointment Daughter to call PCP for follow up  Goal:  Over the next 30 days, the patient will not experience hospital readmission  Interventions:   Evaluation of current treatment plan related to rhabdomyolysis and new gallbladder mass self-management and patient's adherence to plan as established by provider. Discussed plans with patient for ongoing care management follow up and provided patient with direct contact information for care management team Advised patient to Reviewed with daughter importance of reminding patient of using cane with ambulation Provided education to patient re: falls  Social Work referral for resources for senior housing Discussed plans with patient for ongoing care management follow up and provided patient with direct contact information for care management team Advised daughter to call PCP for hospital follow up appointment. Reviewed upcoming appointment with surgeon regarding gallbladder mass Keeping moving and stay active Keep your bones strong Go for regular eye checkups Always stand up slowly Wear proper non-slip footwear Light up your living space Use assistive devices.  Advised daughter to call [facility] to ensure changes with pill packs completed.   Patient Self Care Activities:  Attend all scheduled provider appointments Call pharmacy for medication refills 3-7 days in advance of running out of medications Call provider office for new concerns or questions  Participate in Transition of Care Program/Attend TOC scheduled calls Take medications  as prescribed   Work with the social worker to address care coordination needs and will continue to work with  the clinical team to address health care and disease management related needs  Plan:  Telephone follow up appointment with care management team member scheduled for:  08/22/23         Spoke with daughter Elizabeth [name] who is primary caregiver and contact. Patient doing better but still shaky.  [facility] visit tomorrow.  Patient uses pill packs through [employer] per daughter.  Patient has new diagnosis for a gallbladder mass- surgeon appointment  08/20/23.  Daughter to provide transportation.    Gaia Gullikson J. Avenell Sellers RN, MSN [facility], [facility] Health RN Care Manager Direct Dial: [phone]  Fax: [phone] Website: [email or web address]

## 2023-08-16 ENCOUNTER — Telehealth: Payer: Self-pay | Admitting: *Deleted

## 2023-08-16 DIAGNOSIS — R7881 Bacteremia: Secondary | ICD-10-CM | POA: Diagnosis not present

## 2023-08-16 DIAGNOSIS — K219 Gastro-esophageal reflux disease without esophagitis: Secondary | ICD-10-CM | POA: Diagnosis not present

## 2023-08-16 DIAGNOSIS — F419 Anxiety disorder, unspecified: Secondary | ICD-10-CM | POA: Diagnosis not present

## 2023-08-16 DIAGNOSIS — Z8542 Personal history of malignant neoplasm of other parts of uterus: Secondary | ICD-10-CM | POA: Diagnosis not present

## 2023-08-16 DIAGNOSIS — B951 Streptococcus, group B, as the cause of diseases classified elsewhere: Secondary | ICD-10-CM | POA: Diagnosis not present

## 2023-08-16 DIAGNOSIS — Z9181 History of falling: Secondary | ICD-10-CM | POA: Diagnosis not present

## 2023-08-16 DIAGNOSIS — T796XXD Traumatic ischemia of muscle, subsequent encounter: Secondary | ICD-10-CM | POA: Diagnosis not present

## 2023-08-16 DIAGNOSIS — Z6841 Body Mass Index (BMI) 40.0 and over, adult: Secondary | ICD-10-CM | POA: Diagnosis not present

## 2023-08-16 DIAGNOSIS — L03116 Cellulitis of left lower limb: Secondary | ICD-10-CM | POA: Diagnosis not present

## 2023-08-16 DIAGNOSIS — E669 Obesity, unspecified: Secondary | ICD-10-CM | POA: Diagnosis not present

## 2023-08-16 DIAGNOSIS — I5033 Acute on chronic diastolic (congestive) heart failure: Secondary | ICD-10-CM | POA: Diagnosis not present

## 2023-08-16 DIAGNOSIS — I700 Atherosclerosis of aorta: Secondary | ICD-10-CM | POA: Diagnosis not present

## 2023-08-16 DIAGNOSIS — F209 Schizophrenia, unspecified: Secondary | ICD-10-CM | POA: Diagnosis not present

## 2023-08-16 DIAGNOSIS — I110 Hypertensive heart disease with heart failure: Secondary | ICD-10-CM | POA: Diagnosis not present

## 2023-08-16 DIAGNOSIS — I2510 Atherosclerotic heart disease of native coronary artery without angina pectoris: Secondary | ICD-10-CM | POA: Diagnosis not present

## 2023-08-16 DIAGNOSIS — F32A Depression, unspecified: Secondary | ICD-10-CM | POA: Diagnosis not present

## 2023-08-16 DIAGNOSIS — Z9012 Acquired absence of left breast and nipple: Secondary | ICD-10-CM | POA: Diagnosis not present

## 2023-08-16 DIAGNOSIS — Z853 Personal history of malignant neoplasm of breast: Secondary | ICD-10-CM | POA: Diagnosis not present

## 2023-08-16 LAB — CULTURE, BLOOD (ROUTINE X 2)
Culture: NO GROWTH
Special Requests: ADEQUATE

## 2023-08-16 NOTE — Progress Notes (Signed)
 Complex Care Management Note  Care Guide Note 08/16/2023 Name: Elizabeth [name] MRN: [id number] DOB: [birth year]  Elizabeth [name] is a 78 y.o. year old female who sees Osei-Bonsu, Caretha Chapel, MD for primary care. I reached out to Elizabeth [name] by phone today to offer complex care management services.  [name] was given information about Complex Care Management services today including:   The Complex Care Management services include support from the care team which includes your Nurse Care Manager, Clinical Social Worker, or Pharmacist.  The Complex Care Management team is here to help remove barriers to the health concerns and goals most important to you. Complex Care Management services are voluntary, and the patient may decline or stop services at any time by request to their care team member.   Complex Care Management Consent Status: Patient agreed to services and verbal consent obtained.   Follow up plan:  Telephone appointment with complex care management team member scheduled for:  08/17/2023  Encounter Outcome:  Patient Scheduled  Kandis Ormond, CMA [place]  [facility], [facility] Guide Direct Dial: [phone]  Fax: [phone] Website: [email or web address]

## 2023-08-17 ENCOUNTER — Other Ambulatory Visit: Payer: Self-pay | Admitting: Licensed Clinical Social Worker

## 2023-08-20 DIAGNOSIS — I5032 Chronic diastolic (congestive) heart failure: Secondary | ICD-10-CM | POA: Diagnosis not present

## 2023-08-20 DIAGNOSIS — Z6841 Body Mass Index (BMI) 40.0 and over, adult: Secondary | ICD-10-CM | POA: Diagnosis not present

## 2023-08-20 DIAGNOSIS — K828 Other specified diseases of gallbladder: Secondary | ICD-10-CM | POA: Diagnosis not present

## 2023-08-22 ENCOUNTER — Other Ambulatory Visit: Payer: Self-pay

## 2023-08-22 DIAGNOSIS — R7881 Bacteremia: Secondary | ICD-10-CM | POA: Diagnosis not present

## 2023-08-22 DIAGNOSIS — I5033 Acute on chronic diastolic (congestive) heart failure: Secondary | ICD-10-CM | POA: Diagnosis not present

## 2023-08-22 DIAGNOSIS — I110 Hypertensive heart disease with heart failure: Secondary | ICD-10-CM | POA: Diagnosis not present

## 2023-08-22 DIAGNOSIS — B951 Streptococcus, group B, as the cause of diseases classified elsewhere: Secondary | ICD-10-CM | POA: Diagnosis not present

## 2023-08-22 DIAGNOSIS — L03116 Cellulitis of left lower limb: Secondary | ICD-10-CM | POA: Diagnosis not present

## 2023-08-22 DIAGNOSIS — T796XXD Traumatic ischemia of muscle, subsequent encounter: Secondary | ICD-10-CM | POA: Diagnosis not present

## 2023-08-22 NOTE — Transitions of Care (Post Inpatient/ED Visit) (Signed)
 Transition of Care week 2  Visit Note  08/22/2023  Name: Elizabeth [name] MRN: [id number]          DOB: December 13, 1945  Situation: Patient enrolled in [facility] 30-day program. Visit completed with daughter [name] [facility]) by telephone.   Background:   Initial Transition Care Management Follow-up Telephone Call    Past Medical History:  Diagnosis Date   Anxiety    Breast CA (HCC)    (Lt) breast ca dx 02/2010   Breast cancer (HCC) 03/31/2011   Depression    GERD (gastroesophageal reflux disease) 12/17/2012   Hypertension    Hypertension 12/17/2012   Nausea alone 12/17/2012   Schizophrenia (HCC)     Assessment: Patient Reported Symptoms: Cognitive Cognitive Status:  (short term memory loss)      Neurological Neurological Review of Symptoms: No symptoms reported    HEENT HEENT Symptoms Reported: No symptoms reported      Cardiovascular Cardiovascular Symptoms Reported: Swelling in legs or feet (per daughter mild swelling) Does patient have uncontrolled Hypertension?: Yes Is patient checking Blood Pressure at home?: Yes Patient's Recent BP reading at home: 168/74 Cardiovascular Conditions: Heart failure, Hypertension Cardiovascular Management Strategies: Medication therapy, Diet modification, Routine screening Cardiovascular Self-Management Outcome: 3 (uncertain) Cardiovascular Comment: PCP started patient back on lisinopril   Respiratory Respiratory Symptoms Reported: No symptoms reported    Endocrine Patient reports the following symptoms related to hypoglycemia or hyperglycemia : No symptoms reported    Gastrointestinal Gastrointestinal Symptoms Reported: Nausea Additional Gastrointestinal Details: daughter states patient has some nausea at times but surgeon attributes to gallbaldder mass      Genitourinary Genitourinary Symptoms Reported: Incontinence Additional Genitourinary Details: Intermittent incontinence    Integumentary Integumentary Symptoms Reported: No symptoms  reported    Musculoskeletal Musculoskelatal Symptoms Reviewed: Difficulty walking, Weakness Additional Musculoskeletal Details: General weakness-working with PT Musculoskeletal Management Strategies: Exercise, Adequate rest      Psychosocial Psychosocial Symptoms Reported: Not assessed Additional Psychological Details: daughtert [name] completes the assessment         There were no vitals filed for this visit.  Medications Reviewed Today     Reviewed by Lilinoe Acklin, RN (Case Manager) on 08/22/23 at 1150  Med List Status: <None>   Medication Order Taking? Sig Documenting Provider Last Dose Status Informant  acetaminophen  (TYLENOL ) 500 MG tablet [id number]  Take 1,000 mg by mouth every 6 (six) hours as needed for mild pain (pain score 1-3) or moderate pain (pain score 4-6). [provider]  Active Self, Family Member, Pharmacy Records  ARIPiprazole (ABILIFY) 15 MG tablet [id number]  Take 15 mg by mouth at bedtime.  [provider]  Active Family Member, Self, Pharmacy Records  bismuth subsalicylate (PEPTO BISMOL) 262 MG/15ML suspension [id number]  Take 30 mLs by mouth every 6 (six) hours as needed for indigestion. [provider]  Active Self, Family Member, Pharmacy Records  Cholecalciferol (VITAMIN D) 125 MCG (5000 UT) CAPS [id number]  Take 5,000 Units by mouth daily. [provider]  Active Family Member, Self, Pharmacy Records  clotrimazole-betamethasone (LOTRISONE) cream [id number]  Apply 1 Application topically 2 (two) times daily. [provider]  Active Self, Family Member, Pharmacy Records  escitalopram (LEXAPRO) 20 MG tablet [id number] Yes Take 20 mg by mouth at bedtime.  [provider]  Active Family Member, Self, Pharmacy Records  fluticasone [facility]) 50 MCG/ACT nasal spray [id number] Yes Place 1 spray into both nostrils as needed for allergies. [provider]  Active Self, Family Member, Pharmacy Records  furosemide   (LASIX ) 20 MG tablet [id number] Yes Take 20 mg by mouth daily. [provider]  Active Self, Family Member, Pharmacy Records  lisinopril  (ZESTRIL ) 10 MG tablet [id number] Yes Take 10 mg by mouth daily. [provider]  Active Self, Family Member, Pharmacy Records  methylphenidate (RITALIN) 10 MG tablet [id number] Yes Take 10 mg by mouth 2 (two) times daily. [provider]  Active Self, Family Member, Pharmacy Records  rosuvastatin (CRESTOR) 20 MG tablet [id number] Yes Take 20 mg by mouth daily. [provider]  Active Self, Family Member, Pharmacy Records  Sodium Bicarbonate-Citric Acid (ALKA-SELTZER PO) [id number] Yes Take 1 tablet by mouth as needed. [provider]  Active Self, Family Member, Pharmacy Records  spironolactone (ALDACTONE) 25 MG tablet [id number] Yes Take 25 mg by mouth 2 (two) times daily.  [provider]  Active Family Member, Self, Pharmacy Records  VENTOLIN HFA [phone] Base) MCG/ACT inhaler [id number]  Inhale 2 puffs into the lungs every 6 (six) hours as needed for wheezing or shortness of breath.   Patient not taking: Reported on 08/22/2023   [provider]  Active Family Member, Self, Pharmacy Records           Med Note Karolynn Pack   TFT Aug 11, 2023  5:15 PM) Patient in need of a refill.  vitamin B-12 (CYANOCOBALAMIN) 1000 MCG tablet [id number] Yes Take 1,000 mcg by mouth daily. [provider]  Active Self, Family Member, Pharmacy Records            Goals Addressed             This Visit's Progress    VBCI Transitions of Care (TOC) Care Plan       Problems:  Recent Hospitalization for treatment of rhabdomyolysis and new gallbladder mass Knowledge Deficit Related to Newly found gallbladder mass and No Hospital Follow Up Provider appointment PCP follow up scheduled 08/23/23  Goal:  Over the next 30 days, the patient will not experience hospital readmission  Interventions:   Evaluation  of current treatment plan related to rhabdomyolysis and new gallbladder mass self-management and patient's adherence to plan as established by provider. Discussed plans with patient for ongoing care management follow up and provided patient with direct contact information for care management team Advised patient to Reiterated with daughter importance of reminding patient of using cane with ambulation Provided education to patient re: Scheduling follow up with Cardiology for clearance for gallbladder surgery-daughter to call Discussed plans with patient for ongoing care management follow up and provided patient with direct contact information for care management team Keeping moving and stay active Keep your bones strong Go for regular eye checkups Always stand up slowly Wear proper non-slip footwear Light up your living space Use assistive devices.  Advised daughter to call [facility] to ensure changes with pill packs completed.  Reviewed PT active with patient 2/week  Patient Self Care Activities:  Attend all scheduled provider appointments Call pharmacy for medication refills 3-7 days in advance of running out of medications Call provider office for new concerns or questions  Participate in Transition of Care Program/Attend TOC scheduled calls Take medications as prescribed   Work with the social worker to address care coordination needs and will continue to work with the clinical team to address health care and disease management related needs-patient currently involved with social work. Call Cardiology for clearance appointment  Plan:  Telephone follow up appointment with care management team member scheduled for:  08/29/23         Recommendation:   Specialty provider follow-up Make appointment with Dr. Berry Bristol Continue Current Plan of Care  Follow Up Plan:   Telephone follow-up in 1 week  Jeffery Bachmeier J. Kealy Lewter RN, MSN [facility], [facility]  Health RN Care Manager Direct Dial: [phone]  Fax: [phone] Website: [email or web address]

## 2023-08-22 NOTE — Patient Instructions (Signed)
 Visit Information  Thank you for taking time to visit with me today. Please don't hesitate to contact me if I can be of assistance to you before our next scheduled telephone appointment.  Our next appointment is by telephone on 08/29/23 at 1130 am  Following is a copy of your care plan:   Goals Addressed             This Visit's Progress    VBCI Transitions of Care (TOC) Care Plan       Problems:  Recent Hospitalization for treatment of rhabdomyolysis and new gallbladder mass Knowledge Deficit Related to Newly found gallbladder mass and No Hospital Follow Up Provider appointment PCP follow up scheduled 08/23/23  Goal:  Over the next 30 days, the patient will not experience hospital readmission  Interventions:   Evaluation of current treatment plan related to rhabdomyolysis and new gallbladder mass self-management and patient's adherence to plan as established by provider. Discussed plans with patient for ongoing care management follow up and provided patient with direct contact information for care management team Advised patient to Reiterated with daughter importance of reminding patient of using cane with ambulation Provided education to patient re: Scheduling follow up with Cardiology for clearance for gallbladder surgery-daughter to call Discussed plans with patient for ongoing care management follow up and provided patient with direct contact information for care management team Keeping moving and stay active Keep your bones strong Go for regular eye checkups Always stand up slowly Wear proper non-slip footwear Light up your living space Use assistive devices.  Advised daughter to call [facility] to ensure changes with pill packs completed.  Reviewed PT active with patient 2/week  Patient Self Care Activities:  Attend all scheduled provider appointments Call pharmacy for medication refills 3-7 days in advance of running out of medications Call provider office for  new concerns or questions  Participate in Transition of Care Program/Attend TOC scheduled calls Take medications as prescribed   Work with the social worker to address care coordination needs and will continue to work with the clinical team to address health care and disease management related needs-patient currently involved with social work. Call Cardiology for clearance appointment  Plan:  Telephone follow up appointment with care management team member scheduled for:  08/29/23        Patient verbalizes understanding of instructions and care plan provided today and agrees to view in MyChart. Active MyChart status and patient understanding of how to access instructions and care plan via MyChart confirmed with patient.     The patient has been provided with contact information for the care management team and has been advised to call with any health related questions or concerns.   Please call the care guide team at [phone] if you need to cancel or reschedule your appointment.   Please call the Suicide and Crisis Lifeline: 988 if you are experiencing a Mental Health or Behavioral Health Crisis or need someone to talk to.  Clennon Nasca J. Randall Colden RN, MSN [facility], [facility] Health RN Care Manager Direct Dial: [phone]  Fax: [phone] Website: [email or web address]

## 2023-08-23 DIAGNOSIS — I110 Hypertensive heart disease with heart failure: Secondary | ICD-10-CM | POA: Diagnosis not present

## 2023-08-23 DIAGNOSIS — I5032 Chronic diastolic (congestive) heart failure: Secondary | ICD-10-CM | POA: Diagnosis not present

## 2023-08-23 DIAGNOSIS — N1831 Chronic kidney disease, stage 3a: Secondary | ICD-10-CM | POA: Diagnosis not present

## 2023-08-23 DIAGNOSIS — R7303 Prediabetes: Secondary | ICD-10-CM | POA: Diagnosis not present

## 2023-08-23 DIAGNOSIS — J302 Other seasonal allergic rhinitis: Secondary | ICD-10-CM | POA: Diagnosis not present

## 2023-08-23 DIAGNOSIS — E782 Mixed hyperlipidemia: Secondary | ICD-10-CM | POA: Diagnosis not present

## 2023-08-23 DIAGNOSIS — G4733 Obstructive sleep apnea (adult) (pediatric): Secondary | ICD-10-CM | POA: Diagnosis not present

## 2023-08-23 DIAGNOSIS — E559 Vitamin D deficiency, unspecified: Secondary | ICD-10-CM | POA: Diagnosis not present

## 2023-08-24 ENCOUNTER — Telehealth: Payer: Self-pay | Admitting: *Deleted

## 2023-08-24 DIAGNOSIS — L03116 Cellulitis of left lower limb: Secondary | ICD-10-CM | POA: Diagnosis not present

## 2023-08-24 DIAGNOSIS — T796XXD Traumatic ischemia of muscle, subsequent encounter: Secondary | ICD-10-CM | POA: Diagnosis not present

## 2023-08-24 DIAGNOSIS — B951 Streptococcus, group B, as the cause of diseases classified elsewhere: Secondary | ICD-10-CM | POA: Diagnosis not present

## 2023-08-24 DIAGNOSIS — I5033 Acute on chronic diastolic (congestive) heart failure: Secondary | ICD-10-CM | POA: Diagnosis not present

## 2023-08-24 DIAGNOSIS — I110 Hypertensive heart disease with heart failure: Secondary | ICD-10-CM | POA: Diagnosis not present

## 2023-08-24 DIAGNOSIS — R7881 Bacteremia: Secondary | ICD-10-CM | POA: Diagnosis not present

## 2023-08-24 NOTE — Telephone Encounter (Signed)
   Name: NICKIE WARWICK  DOB: November 03, 1945  MRN: [id number]  Primary Cardiologist: Carlis Cherry   Chart reviewed as part of pre-operative protocol coverage. The patient has an upcoming visit scheduled with Palmer Bobo, NP on 09/04/2023 at which time clearance can be addressed in case there are any issues that would impact surgical recommendations.  Laparoscopic cholecystectomy Is not scheduled until TBD as below. I added preop FYI to appointment note so that provider is aware to address at time of outpatient visit.  Per office protocol the cardiology provider should forward their finalized clearance decision and recommendations regarding antiplatelet therapy to the requesting party below.     I will route this message as FYI to requesting party and remove this message from the preop box as separate preop APP input not needed at this time.   Please call with any questions.  Friddie Jetty, NP  08/24/2023, 1:52 PM

## 2023-08-24 NOTE — Patient Instructions (Signed)
 Visit Information  Thank you for taking time to visit with me today. Please don't hesitate to contact me if I can be of assistance to you before our next scheduled appointment.  Our next appointment is by telephone on 07/11 at 12:30 PM Please call the care guide team at [phone] if you need to cancel or reschedule your appointment.   Following is a copy of your care plan:   Goals Addressed             This Visit's Progress    LCSW VBCI Social Work Care Plan   On track    Problems:   Level of Care Concerns:Inability to perform ADL's independently   CSW Clinical Goal(s):   Over the next 90 days the Caregiver will attend all scheduled medical appointments as evidenced by patient report and care team review of appointment completion in electronic MEDICAL RECORD NUMBER  work with Child psychotherapist to address concerns related to strengthening support for Caregiver to promote safety and well-being for pt.  Interventions:  Level of Care Concerns in a patient with CHF Current level of care: home, alone Evaluation of patient's unmet needs in current living environment ADL's Assessed needs, level of care concerns, how currently meeting needs and barriers to care Problem [facility] strategies reviewed Reviewed basic eligibility and provided education on Personal Care Service process,  Facility  Patient is not interested in placement at this time  Patient Goals/Self-Care Activities:  Continue taking your medication as prescribed.   Increase coping skills and healthy habits  Plan:   Telephone follow up appointment with care management team member scheduled for:  2-4 weeks        Please call the Suicide and Crisis Lifeline: 988 go to [facility] Urgent [facility] [address] [place] [phone]) call 911 if you are experiencing a Mental Health or Behavioral Health Crisis or need someone to talk to.  Patient verbalizes understanding of  instructions and care plan provided today and agrees to view in MyChart. Active MyChart status and patient understanding of how to access instructions and care plan via MyChart confirmed with patient.     Arlis Bent [facility] Health  [facility], [facility] Clinical Social Worker Direct Dial: [phone]  Fax: [phone] Website: [email or web address] 12:36 PM

## 2023-08-24 NOTE — Patient Outreach (Signed)
 Complex Care Management   Visit Note  08/17/2023  Name:  Elizabeth [name] MRN: [id number] DOB: [birth year]  Situation: Referral received for Complex Care Management related to Heart Failure I obtained verbal consent from Caregiver.  Visit completed with adult daughter, Elizabeth [name]  on the phone  Background:   Past Medical History:  Diagnosis Date   Anxiety    Breast CA (HCC)    (Lt) breast ca dx 02/2010   Breast cancer (HCC) 03/31/2011   Depression    GERD (gastroesophageal reflux disease) 12/17/2012   Hypertension    Hypertension 12/17/2012   Nausea alone 12/17/2012   Schizophrenia (HCC)     Assessment: Patient Reported Symptoms:  Cognitive Cognitive Status: Alert and oriented to person, place, and time Cognitive/Intellectual Conditions Management [RPT]: None reported or documented in medical history or problem list      Neurological Neurological Review of Symptoms: No symptoms reported    HEENT HEENT Symptoms Reported: No symptoms reported      Cardiovascular Cardiovascular Symptoms Reported: Not assessed Does patient have uncontrolled Hypertension?: Yes Is patient checking Blood Pressure at home?: Yes Cardiovascular Conditions: Heart failure, Hypertension Cardiovascular Management Strategies: Medication therapy, Routine screening  Respiratory Respiratory Symptoms Reported: Shortness of breath    Endocrine Patient reports the following symptoms related to hypoglycemia or hyperglycemia : No symptoms reported    Gastrointestinal Gastrointestinal Symptoms Reported: Not assessed      Genitourinary Genitourinary Symptoms Reported: Not assessed    Integumentary Integumentary Symptoms Reported: No symptoms reported    Musculoskeletal Musculoskelatal Symptoms Reviewed: Weakness        Psychosocial Additional Psychological Details: Stress management resources discussed to strengthen support and decrease caregiver stress Behavioral Management Strategies: Support system,  Coping strategies Major Change/Loss/Stressor/Fears (CP): Medical condition, self Quality of Family Relationships: helpful, involved, supportive Do you feel physically threatened by others?: No       No data to display          There were no vitals filed for this visit.  Medications Reviewed Today     Reviewed by Adriana Albany, LCSW (Social Worker) on 08/24/23 at 1224  Med List Status: <None>   Medication Order Taking? Sig Documenting Provider Last Dose Status Informant  acetaminophen  (TYLENOL ) 500 MG tablet [id number] Yes Take 1,000 mg by mouth every 6 (six) hours as needed for mild pain (pain score 1-3) or moderate pain (pain score 4-6). [provider]  Active Self, Family Member, Pharmacy Records  amoxicillin  (AMOXIL ) 500 MG capsule [id number] Yes Take 2 capsules (1,000 mg total) by mouth 3 (three) times daily for 5 days. Ozell Blunt, MD  Expired 08/19/23 2359   ARIPiprazole (ABILIFY) 15 MG tablet [id number] Yes Take 15 mg by mouth at bedtime.  [provider]  Active Family Member, Self, Pharmacy Records  bismuth subsalicylate (PEPTO BISMOL) 262 MG/15ML suspension [id number] Yes Take 30 mLs by mouth every 6 (six) hours as needed for indigestion. [provider]  Active Self, Family Member, Pharmacy Records  Cholecalciferol (VITAMIN D) 125 MCG (5000 UT) CAPS [id number] Yes Take 5,000 Units by mouth daily. [provider]  Active Family Member, Self, Pharmacy Records  clotrimazole-betamethasone (LOTRISONE) cream [id number] Yes Apply 1 Application topically 2 (two) times daily. [provider]  Active Self, Family Member, Pharmacy Records  escitalopram (LEXAPRO) 20 MG tablet [id number] Yes Take 20 mg by mouth at bedtime.  [provider]  Active Family Member, Self, Pharmacy Records  fluticasone [facility]) 50 MCG/ACT nasal spray [id number] Yes  Place 1 spray into both nostrils as needed for allergies. [provider]  Active Self,  Family Member, Pharmacy Records  furosemide  (LASIX ) 20 MG tablet [id number] Yes Take 20 mg by mouth daily. [provider]  Active Self, Family Member, Pharmacy Records  lisinopril  (ZESTRIL ) 10 MG tablet [id number]  Take 10 mg by mouth daily. [provider]  Active Self, Family Member, Pharmacy Records  methylphenidate (RITALIN) 10 MG tablet [id number] Yes Take 10 mg by mouth 2 (two) times daily. [provider]  Active Self, Family Member, Pharmacy Records  rosuvastatin (CRESTOR) 20 MG tablet [id number] Yes Take 20 mg by mouth daily. [provider]  Active Self, Family Member, Pharmacy Records  Sodium Bicarbonate-Citric Acid (ALKA-SELTZER PO) [id number] Yes Take 1 tablet by mouth as needed. [provider]  Active Self, Family Member, Pharmacy Records  spironolactone (ALDACTONE) 25 MG tablet [id number] Yes Take 25 mg by mouth 2 (two) times daily.  [provider]  Active Family Member, Self, Pharmacy Records  VENTOLIN HFA [phone] Base) MCG/ACT inhaler [id number]  Inhale 2 puffs into the lungs every 6 (six) hours as needed for wheezing or shortness of breath.   Patient not taking: Reported on 08/22/2023   [provider]  Active Family Member, Self, Pharmacy Records           Med Note Karolynn Pack   QVZ Aug 11, 2023  5:15 PM) Patient in need of a refill.  vitamin B-12 (CYANOCOBALAMIN) 1000 MCG tablet [id number] Yes Take 1,000 mcg by mouth daily. [provider]  Active Self, Family Member, Pharmacy Records            Recommendation:   Continue Current Plan of Care  Follow Up Plan:   Telephone follow-up in 1 month  Alease Hunter, LCSW [facility]  [facility], [facility] Clinical Social Worker Direct Dial: [phone]  Fax: [phone] Website: [email or web address] 12:36 PM

## 2023-08-24 NOTE — Telephone Encounter (Signed)
   Pre-operative Risk Assessment    Patient Name: Elizabeth [name]  DOB: 03-02-1946 MRN: [id number]   Date of last office visit: 12/02/21 DR. Berry Bristol Date of next office visit: 09/04/23 M[place], NP   Request for Surgical Clearance    Procedure:  LAPAROSCOPIC CHOLECYSTECTOMY   Date of Surgery:  Clearance TBD                                Surgeon:  DR. Karleen Overall Surgeon's Group or Practice Name:  [facility] Phone number:  [phone] Fax number:  [phone] LAURA ADKINS, CMA   Type of Clearance Requested:   - Medical ; NONE INDICATED TO BE HELD   Type of Anesthesia:  General    Additional requests/questions:    Princeton Broom   08/24/2023, 1:48 PM

## 2023-08-28 DIAGNOSIS — T796XXD Traumatic ischemia of muscle, subsequent encounter: Secondary | ICD-10-CM | POA: Diagnosis not present

## 2023-08-28 DIAGNOSIS — I110 Hypertensive heart disease with heart failure: Secondary | ICD-10-CM | POA: Diagnosis not present

## 2023-08-28 DIAGNOSIS — L03116 Cellulitis of left lower limb: Secondary | ICD-10-CM | POA: Diagnosis not present

## 2023-08-28 DIAGNOSIS — I5033 Acute on chronic diastolic (congestive) heart failure: Secondary | ICD-10-CM | POA: Diagnosis not present

## 2023-08-28 DIAGNOSIS — B951 Streptococcus, group B, as the cause of diseases classified elsewhere: Secondary | ICD-10-CM | POA: Diagnosis not present

## 2023-08-28 DIAGNOSIS — R7881 Bacteremia: Secondary | ICD-10-CM | POA: Diagnosis not present

## 2023-08-29 ENCOUNTER — Other Ambulatory Visit: Payer: Self-pay

## 2023-08-29 NOTE — Transitions of Care (Post Inpatient/ED Visit) (Signed)
 Transition of Care week 3  Visit Note  08/29/2023  Name: Elizabeth [name] MRN: [id number]          DOB: 15-Dec-1945  Situation: Patient enrolled in [facility] 30-day program. Visit completed with daughter Waldemar by telephone.   Background:   Initial Transition Care Management Follow-up Telephone Call    Past Medical History:  Diagnosis Date   Anxiety    Breast CA (HCC)    (Lt) breast ca dx 02/2010   Breast cancer (HCC) 03/31/2011   Depression    GERD (gastroesophageal reflux disease) 12/17/2012   Hypertension    Hypertension 12/17/2012   Nausea alone 12/17/2012   Schizophrenia (HCC)     Assessment: Patient Reported Symptoms: Cognitive Cognitive Status: Alert and oriented to person, place, and time      Neurological Neurological Review of Symptoms: No symptoms reported    HEENT HEENT Symptoms Reported: No symptoms reported      Cardiovascular Cardiovascular Symptoms Reported: Swelling in legs or feet (mild swelling per daughter) Does patient have uncontrolled Hypertension?: Yes Is patient checking Blood Pressure at home?: Yes Patient's Recent BP reading at home: 130/70    Respiratory Respiratory Symptoms Reported: No symptoms reported    Endocrine Patient reports the following symptoms related to hypoglycemia or hyperglycemia : No symptoms reported    Gastrointestinal Gastrointestinal Symptoms Reported: Nausea Additional Gastrointestinal Details: nausea at times continues      Genitourinary Genitourinary Symptoms Reported: Incontinence Additional Genitourinary Details: Intermittent incontinence    Integumentary Integumentary Symptoms Reported: No symptoms reported    Musculoskeletal Musculoskelatal Symptoms Reviewed: Difficulty walking, Weakness Additional Musculoskeletal Details: General weakness- PT continue 2 /week Musculoskeletal Self-Management Outcome: 4 (good)      Psychosocial Psychosocial Symptoms Reported: Not assessed Additional Psychological Details:  daughter [name] completes the assessment         There were no vitals filed for this visit.  Medications Reviewed Today     Reviewed by Minnetta Sandora, RN (Case Manager) on 08/29/23 at 1143  Med List Status: <None>   Medication Order Taking? Sig Documenting Provider Last Dose Status Informant  acetaminophen  (TYLENOL ) 500 MG tablet [id number] Yes Take 1,000 mg by mouth every 6 (six) hours as needed for mild pain (pain score 1-3) or moderate pain (pain score 4-6). [provider]  Active Self, Family Member, Pharmacy Records  ARIPiprazole (ABILIFY) 15 MG tablet [id number] Yes Take 15 mg by mouth at bedtime.  [provider]  Active Family Member, Self, Pharmacy Records  bismuth subsalicylate (PEPTO BISMOL) 262 MG/15ML suspension [id number] Yes Take 30 mLs by mouth every 6 (six) hours as needed for indigestion. [provider]  Active Self, Family Member, Pharmacy Records  Cholecalciferol (VITAMIN D) 125 MCG (5000 UT) CAPS [id number] Yes Take 5,000 Units by mouth daily. [provider]  Active Family Member, Self, Pharmacy Records  clotrimazole-betamethasone (LOTRISONE) cream [id number] Yes Apply 1 Application topically 2 (two) times daily. [provider]  Active Self, Family Member, Pharmacy Records  escitalopram (LEXAPRO) 20 MG tablet [id number] Yes Take 20 mg by mouth at bedtime.  [provider]  Active Family Member, Self, Pharmacy Records  fluticasone [facility]) 50 MCG/ACT nasal spray [id number] Yes Place 1 spray into both nostrils as needed for allergies. [provider]  Active Self, Family Member, Pharmacy Records  furosemide  (LASIX ) 20 MG tablet [id number] Yes Take 20 mg by mouth daily. [provider]  Active Self, Family Member, Pharmacy Records  lisinopril  (ZESTRIL ) 10 MG tablet [id number] Yes Take 10  mg by mouth daily. [provider]  Active Self, Family Member, Pharmacy Records  methylphenidate (RITALIN) 10 MG  tablet [id number] Yes Take 10 mg by mouth 2 (two) times daily. [provider]  Active Self, Family Member, Pharmacy Records  rosuvastatin (CRESTOR) 20 MG tablet [id number] Yes Take 20 mg by mouth daily. [provider]  Active Self, Family Member, Pharmacy Records  Sodium Bicarbonate-Citric Acid (ALKA-SELTZER PO) [id number] Yes Take 1 tablet by mouth as needed. [provider]  Active Self, Family Member, Pharmacy Records  spironolactone (ALDACTONE) 25 MG tablet [id number] Yes Take 25 mg by mouth 2 (two) times daily.  [provider]  Active Family Member, Self, Pharmacy Records  VENTOLIN HFA 108 [phone] Base) MCG/ACT inhaler [id number]  Inhale 2 puffs into the lungs every 6 (six) hours as needed for wheezing or shortness of breath.   Patient not taking: Reported on 08/29/2023   [provider]  Active Family Member, Self, Pharmacy Records           Med Note [name]   Dju Aug 11, 2023  5:15 PM) Patient in need of a refill.  vitamin B-12 (CYANOCOBALAMIN) 1000 MCG tablet [id number] Yes Take 1,000 mcg by mouth daily. [provider]  Active Self, Family Member, Pharmacy Records            Goals Addressed             This Visit's Progress    VBCI Transitions of Care (TOC) Care Plan       Problems:  Recent Hospitalization for treatment of rhabdomyolysis and new gallbladder mass Knowledge Deficit Related to Newly found gallbladder mass  08/29/23-daughter [name] reports patient doing pretty good.  Continues with PT.  Waiting for Clearance appointment for gallbladder surgery 09/04/23.  Patient appetite is not as great as before but possibly attributed to gallbladder issues and nausea.    Goal:  Over the next 30 days, the patient will not experience hospital readmission  Interventions:   Evaluation of current treatment plan related to rhabdomyolysis and new gallbladder mass self-management and patient's adherence to plan as established  by provider. Discussed plans with patient for ongoing care management follow up and provided patient with direct contact information for care management team Provided education to patient re: Cardiology clearance appointment 09/04/23 Discussed plans with patient for ongoing care management follow up and provided patient with direct contact information for care management team Keeping moving and stay active Keep your bones strong Go for regular eye checkups Always stand up slowly Wear proper non-slip footwear Light up your living space Use assistive devices.  PT active with patient 2/week  Patient Self Care Activities:  Attend all scheduled provider appointments Call pharmacy for medication refills 3-7 days in advance of running out of medications Call provider office for new concerns or questions  Participate in Transition of Care Program/Attend TOC scheduled calls Take medications as prescribed   Work with the social worker to address care coordination needs and will continue to work with the clinical team to address health care and disease management related needs-patient currently involved with social work. Drinking plenty of fluids Avoid spicy foods Try small frequent meals.     Plan:  Telephone follow up appointment with care management team member scheduled for:  09/04/23        Recommendation:   Continue Current Plan of Care  Follow Up Plan:   Telephone follow-up in 1 week  Martavious Hartel J. Dorien Mayotte RN, MSN [facility]  [facility], [facility] RN Care Manager Direct Dial: [phone]  Fax: [phone] Website: [email or web address]

## 2023-08-29 NOTE — Patient Instructions (Signed)
 Visit Information  Thank you for taking time to visit with me today. Please don't hesitate to contact me if I can be of assistance to you before our next scheduled telephone appointment.  Our next appointment is by telephone on 09/04/23 at 1115 am  Following is a copy of your care plan:   Goals Addressed             This Visit's Progress    VBCI Transitions of Care (TOC) Care Plan       Problems:  Recent Hospitalization for treatment of rhabdomyolysis and new gallbladder mass Knowledge Deficit Related to Newly found gallbladder mass  08/29/23-daughter [name] reports patient doing pretty good.  Continues with PT.  Waiting for Clearance appointment for gallbladder surgery 09/04/23.  Patient appetite is not as great as before but possibly attributed to gallbladder issues and nausea.    Goal:  Over the next 30 days, the patient will not experience hospital readmission  Interventions:   Evaluation of current treatment plan related to rhabdomyolysis and new gallbladder mass self-management and patient's adherence to plan as established by provider. Discussed plans with patient for ongoing care management follow up and provided patient with direct contact information for care management team Provided education to patient re: Cardiology clearance appointment 09/04/23 Discussed plans with patient for ongoing care management follow up and provided patient with direct contact information for care management team Keeping moving and stay active Keep your bones strong Go for regular eye checkups Always stand up slowly Wear proper non-slip footwear Light up your living space Use assistive devices.  PT active with patient 2/week  Patient Self Care Activities:  Attend all scheduled provider appointments Call pharmacy for medication refills 3-7 days in advance of running out of medications Call provider office for new concerns or questions  Participate in Transition of Care Program/Attend TOC  scheduled calls Take medications as prescribed   Work with the social worker to address care coordination needs and will continue to work with the clinical team to address health care and disease management related needs-patient currently involved with social work. Drinking plenty of fluids Avoid spicy foods Try small frequent meals.     Plan:  Telephone follow up appointment with care management team member scheduled for:  09/04/23        Patient verbalizes understanding of instructions and care plan provided today and agrees to view in MyChart. Active MyChart status and patient understanding of how to access instructions and care plan via MyChart confirmed with patient.     The patient has been provided with contact information for the care management team and has been advised to call with any health related questions or concerns.   Please call the care guide team at [phone] if you need to cancel or reschedule your appointment.   Please call the Suicide and Crisis Lifeline: 988 if you are experiencing a Mental Health or Behavioral Health Crisis or need someone to talk to.  Delanda Bulluck J. Nosson Wender RN, MSN [facility], [facility] RN Care Manager Direct Dial: [phone]  Fax: [phone] Website: [email or web address]

## 2023-08-31 DIAGNOSIS — L03116 Cellulitis of left lower limb: Secondary | ICD-10-CM | POA: Diagnosis not present

## 2023-08-31 DIAGNOSIS — R7881 Bacteremia: Secondary | ICD-10-CM | POA: Diagnosis not present

## 2023-08-31 DIAGNOSIS — B951 Streptococcus, group B, as the cause of diseases classified elsewhere: Secondary | ICD-10-CM | POA: Diagnosis not present

## 2023-08-31 DIAGNOSIS — I5033 Acute on chronic diastolic (congestive) heart failure: Secondary | ICD-10-CM | POA: Diagnosis not present

## 2023-08-31 DIAGNOSIS — I110 Hypertensive heart disease with heart failure: Secondary | ICD-10-CM | POA: Diagnosis not present

## 2023-08-31 DIAGNOSIS — T796XXD Traumatic ischemia of muscle, subsequent encounter: Secondary | ICD-10-CM | POA: Diagnosis not present

## 2023-09-04 ENCOUNTER — Encounter: Payer: Self-pay | Admitting: Emergency Medicine

## 2023-09-04 ENCOUNTER — Ambulatory Visit: Attending: Cardiology | Admitting: Emergency Medicine

## 2023-09-04 VITALS — BP 124/60 | HR 65 | Ht 61.0 in | Wt 214.0 lb

## 2023-09-04 DIAGNOSIS — E7800 Pure hypercholesterolemia, unspecified: Secondary | ICD-10-CM | POA: Insufficient documentation

## 2023-09-04 DIAGNOSIS — Z01810 Encounter for preprocedural cardiovascular examination: Secondary | ICD-10-CM | POA: Insufficient documentation

## 2023-09-04 DIAGNOSIS — G4733 Obstructive sleep apnea (adult) (pediatric): Secondary | ICD-10-CM | POA: Diagnosis not present

## 2023-09-04 DIAGNOSIS — I10 Essential (primary) hypertension: Secondary | ICD-10-CM | POA: Diagnosis not present

## 2023-09-04 DIAGNOSIS — I5032 Chronic diastolic (congestive) heart failure: Secondary | ICD-10-CM | POA: Insufficient documentation

## 2023-09-04 DIAGNOSIS — I6523 Occlusion and stenosis of bilateral carotid arteries: Secondary | ICD-10-CM | POA: Diagnosis not present

## 2023-09-04 DIAGNOSIS — I340 Nonrheumatic mitral (valve) insufficiency: Secondary | ICD-10-CM | POA: Diagnosis not present

## 2023-09-04 NOTE — Patient Instructions (Addendum)
 Medication Instructions:  NO CHANGES   Lab Work: NONE   Testing/Procedures: NONE  Follow-Up: At [facility], you and your health needs are our priority.  As part of our continuing mission to provide you with exceptional heart care, our providers are all part of one team.  This team includes your primary Cardiologist (physician) and Advanced Practice Providers or APPs (Physician Assistants and Nurse Practitioners) who all work together to provide you with the care you need, when you need it.  Your next appointment:   1 YEAR  Provider:   MADISON FOUNTAIN, NP

## 2023-09-04 NOTE — Progress Notes (Signed)
 Cardiology Office Note:    Date:  09/04/2023  ID:  Elizabeth [name], DOB 1945/03/20, MRN [id number] PCP: Catalina Bare, MD  [facility] Providers Cardiologist:  Gordy Bergamo, MD { Click to update primary MD,subspecialty MD or APP then REFRESH:1}    {Click to Open Review  :1}   Patient Profile:       Chief Complaint: Preoperative cardiovascular exam History of Present Illness:  Elizabeth [name] is a 78 y.o. female with visit-pertinent history of left breast cancer in 2011 s/p mastectomy and now in remission, hypertension, prediabetes, uterine cancer s/p hysterectomy on 12/17/2018, depression, schizophrenia, morbid obesity, chronic dyspnea on exertion and leg edema  She was last seen in office on 12/02/2021 for annual visit.  Overall she was doing well at the time.  No changes to medications were made.  She underwent echocardiogram on 12/29/2021 showed LVEF 60 to 65%, no RWMA, mild mitral valve regurgitation, mild tricuspid valve regurgitation.  Carotid artery duplex 12/2021 showed minimal right and left internal carotid artery stenosis.  She is now pending laparoscopic cholecystectomy on date TBD with [facility]  Discussed the use of AI scribe software for clinical note transcription with the patient, who gave verbal consent to proceed.  History of Present Illness Elizabeth [name] is a 78 year old female with diastolic heart failure who presents for preoperative cardiac evaluation for laparoscopic cholecystectomy.   Today patient is doing well without acute cardiovascular concerns or complaint at this time.  She has history of diastolic heart failure on Lasix  and spironolactone, with stable weight.  She denies shortness of breath, orthopnea, PND, chest pain, or leg swelling.  She has obstructive sleep apnea but does not use a CPAP machine. She is active, performing household activities and climbing stairs slowly. She is receiving physical therapy at home and has stopped consuming  sweets to manage her weight.  Overall she is able to complete greater than 4 METS.  Review of systems:  Please see the history of present illness. All other systems are reviewed and otherwise negative.      Studies Reviewed:    EKG Interpretation Date/Time:  Tuesday September 04 2023 10:05:04 EDT Ventricular Rate:  65 PR Interval:  176 QRS Duration:  84 QT Interval:  440 QTC Calculation: 457 R Axis:   12  Text Interpretation: Normal sinus rhythm Minimal voltage criteria for LVH, may be normal variant ( R in aVL ) When compared with ECG of 11-Aug-2023 11:31, PREVIOUS ECG IS PRESENT Confirmed by Rana Dixon [id number]) on 09/04/2023 1:24:44 PM    Echocardiogram 12/29/2021 Normal LV systolic function with visual EF 60-65%. Left ventricle cavity  is normal in size. Normal left ventricular wall thickness. Normal global  wall motion. Indeterminate diastolic filling pattern. Calculated EF 68%.  Structurally normal mitral valve.  Mild (Grade I) mitral regurgitation.  Structurally normal tricuspid valve.  Mild tricuspid regurgitation. No  evidence of pulmonary hypertension. RVSP measures 35 mmHg.  No significant change compared to 05/2021.   Carotid artery duplex 12/29/2021 Duplex suggests stenosis in the right internal carotid artery (minimal).  Duplex suggests stenosis in the left internal carotid artery (minimal).  Antegrade right vertebral artery flow. Antegrade left vertebral artery  flow.   Lexiscan  Myoview  04/16/2019 Nondiagnostic ECG stress. There is a fixed moderate defect in the inferior and apical regions consistent with soft tissue attenuation. Overall LV systolic function is normal without regional wall motion abnormalities. Stress LV EF: 71%.  No previous exam available for comparison. Low  risk study.  Risk Assessment/Calculations:              Physical Exam:   VS:  BP 124/60 (BP Location: Right Arm, Patient Position: Sitting, Cuff Size: Large)   Pulse 65   Ht 5' 1  (1.549 m)   Wt 214 lb (97.1 kg)   BMI 40.43 kg/m    Wt Readings from Last 3 Encounters:  09/04/23 214 lb (97.1 kg)  08/15/23 223 lb (101.2 kg)  08/12/23 223 lb 6.4 oz (101.3 kg)    GEN: Well nourished, well developed in no acute distress NECK: No JVD; No carotid bruits CARDIAC: RRR, no murmurs, rubs, gallops RESPIRATORY:  Clear to auscultation without rales, wheezing or rhonchi  ABDOMEN: Soft, non-tender, non-distended EXTREMITIES:  No edema; No acute deformity      Assessment and Plan:  Diastolic heart failure Echocardiogram 12/2021 with LVEF 60 to 65% - Today patient is euvolemic and well compensated on exam.  NYHA class II symptoms without dyspnea, orthopnea, PND.  Her weight has been stable - Continue furosemide  20 mg daily and spironolactone 25 mg twice daily  Hypertension Blood pressure is 124/60 and well-controlled - No changes to current therapy.  Encouraged BP monitoring at home - Continue furosemide  20 mg daily, lisinopril  10 mg daily, spironolactone 25 mg twice daily  Mitral regurgitation Echocardiogram 12/2021 showed mild mitral valve regurgitation - Today patient remains asymptomatic without chest pains, dyspnea, syncope - There is no indication for intervention at this time - Can repeat echocardiogram in 2-4 years for routine surveillance  Carotid artery disease Carotid artery duplex 12/2021 showed minimal bilateral carotid artery stenosis - Today patient remains asymptomatic.  There is no indication for further intervention at this time  Hyperlipidemia LDL 69 on 03/2023 and well-controlled Has been managed by PCP - Continue rosuvastatin 20 mg daily  Obstructive sleep apnea - Not currently adherent to CPAP therapy - Much education given on importance of well-controlled OSA  Preoperative cardiovascular exam According to the Revised Cardiac Risk Index (RCRI), her Perioperative Risk of Major Cardiac Event is (%): 0.9. Her Functional Capacity in METs is: 5.07  according to the [facility] Activity Status Index (DASI). Therefore, based on ACC/AHA guidelines, patient would be at acceptable risk for the planned procedure without further cardiovascular testing. I will route this recommendation to the requesting party via Epic fax function.       Dispo:  Return in about 1 year (around 09/03/2024).  Signed, Lum LITTIE Louis, NP

## 2023-09-05 ENCOUNTER — Other Ambulatory Visit: Payer: Self-pay

## 2023-09-05 ENCOUNTER — Encounter: Payer: Self-pay | Admitting: Emergency Medicine

## 2023-09-05 DIAGNOSIS — R7881 Bacteremia: Secondary | ICD-10-CM | POA: Diagnosis not present

## 2023-09-05 DIAGNOSIS — I110 Hypertensive heart disease with heart failure: Secondary | ICD-10-CM | POA: Diagnosis not present

## 2023-09-05 DIAGNOSIS — L03116 Cellulitis of left lower limb: Secondary | ICD-10-CM | POA: Diagnosis not present

## 2023-09-05 DIAGNOSIS — I5033 Acute on chronic diastolic (congestive) heart failure: Secondary | ICD-10-CM | POA: Diagnosis not present

## 2023-09-05 DIAGNOSIS — B951 Streptococcus, group B, as the cause of diseases classified elsewhere: Secondary | ICD-10-CM | POA: Diagnosis not present

## 2023-09-05 DIAGNOSIS — T796XXD Traumatic ischemia of muscle, subsequent encounter: Secondary | ICD-10-CM | POA: Diagnosis not present

## 2023-09-05 NOTE — Patient Instructions (Signed)
 Visit Information  Thank you for taking time to visit with me today. Please don't hesitate to contact me if I can be of assistance to you before our next scheduled telephone appointment.  Our next appointment is by telephone on 09/12/23 at 1100 am  Following is a copy of your care plan:   Goals Addressed             This Visit's Progress    VBCI Transitions of Care (TOC) Care Plan       Problems:  Recent Hospitalization for treatment of rhabdomyolysis and new gallbladder mass Knowledge Deficit Related to Newly found gallbladder mass  09/05/23 daughter reports patient doing well. Home health PT continues.  Cariology has cleared for surgery.  Waiting scheduling from surgeon.    Goal:  Over the next 30 days, the patient will not experience hospital readmission  Interventions:   Evaluation of current treatment plan related to rhabdomyolysis and new gallbladder mass self-management and patient's adherence to plan as established by provider. Discussed plans with patient for ongoing care management follow up and provided patient with direct contact information for care management team Provided education to patient re: any changes to contact PCP or return to ER Discussed plans with patient for ongoing care management follow up and provided patient with direct contact information for care management team Keeping moving and stay active Keep your bones strong Go for regular eye checkups Always stand up slowly Wear proper non-slip footwear Light up your living space Use assistive devices.  PT active with patient 2/week  Patient Self Care Activities:  Attend all scheduled provider appointments Call pharmacy for medication refills 3-7 days in advance of running out of medications Call provider office for new concerns or questions  Participate in Transition of Care Program/Attend TOC scheduled calls Take medications as prescribed   Work with the social worker to address care coordination  needs and will continue to work with the clinical team to address health care and disease management related needs-patient currently involved with social work. Drinking plenty of fluids Avoid spicy foods Try small frequent meals.     Plan:  Telephone follow up appointment with care management team member scheduled for:  09/12/23        Patient verbalizes understanding of instructions and care plan provided today and agrees to view in MyChart. Active MyChart status and patient understanding of how to access instructions and care plan via MyChart confirmed with patient.     The patient has been provided with contact information for the care management team and has been advised to call with any health related questions or concerns.   Please call the care guide team at [phone] if you need to cancel or reschedule your appointment.   Please call the Suicide and Crisis Lifeline: 988 if you are experiencing a Mental Health or Behavioral Health Crisis or need someone to talk to.  Kiowa Hollar J. Jalicia Roszak RN, MSN [facility], [facility] RN Care Manager Direct Dial: [phone]  Fax: [phone] Website: [email or web address]

## 2023-09-05 NOTE — Transitions of Care (Post Inpatient/ED Visit) (Signed)
 Transition of Care week 4  Visit Note  09/05/2023  Name: Elizabeth [name] MRN: [id number]          DOB: [birth year]  Situation: Patient enrolled in [employer] 30-day program. Visit completed with daughter [name] by telephone.   Background:   Initial Transition Care Management Follow-up Telephone Call    Past Medical History:  Diagnosis Date   Anxiety    Breast CA (HCC)    (Lt) breast ca dx 02/2010   Breast cancer (HCC) 03/31/2011   Depression    GERD (gastroesophageal reflux disease) 12/17/2012   Hypertension    Hypertension 12/17/2012   Nausea alone 12/17/2012   Schizophrenia (HCC)     Assessment: Patient Reported Symptoms: Cognitive Cognitive Status: Alert and oriented to person, place, and time (per daughter [place])      Neurological Neurological Review of Symptoms: No symptoms reported    HEENT HEENT Symptoms Reported: No symptoms reported      Cardiovascular Cardiovascular Symptoms Reported: No symptoms reported    Respiratory Respiratory Symptoms Reported: No symptoms reported    Endocrine Endocrine Symptoms Reported: No symptoms reported    Gastrointestinal Gastrointestinal Symptoms Reported: Nausea Additional Gastrointestinal Details: nausea at times      Genitourinary Genitourinary Symptoms Reported: Incontinence Additional Genitourinary Details: Intermittient incontinence    Integumentary Integumentary Symptoms Reported: No symptoms reported    Musculoskeletal Musculoskelatal Symptoms Reviewed: Difficulty walking, Weakness Additional Musculoskeletal Details: General Weakness-uses cane. PT continues        Psychosocial Psychosocial Symptoms Reported: Not assessed Additional Psychological Details: Daughter completes assessment         There were no vitals filed for this visit.  Medications Reviewed Today     Reviewed by Kabir Brannock, RN (Case Manager) on 09/05/23 at 1126  Med List Status: <None>   Medication Order Taking? Sig Documenting Provider  Last Dose Status Informant  acetaminophen  (TYLENOL ) 500 MG tablet [id number] Yes Take 1,000 mg by mouth every 6 (six) hours as needed for mild pain (pain score 1-3) or moderate pain (pain score 4-6). [provider]  Active Self, Family Member, Pharmacy Records  ARIPiprazole (ABILIFY) 15 MG tablet [id number] Yes Take 15 mg by mouth at bedtime.  [provider]  Active Family Member, Self, Pharmacy Records  bismuth subsalicylate (PEPTO BISMOL) 262 MG/15ML suspension [id number] Yes Take 30 mLs by mouth every 6 (six) hours as needed for indigestion. [provider]  Active Self, Family Member, Pharmacy Records  Cholecalciferol (VITAMIN D) 125 MCG (5000 UT) CAPS [id number] Yes Take 5,000 Units by mouth daily. [provider]  Active Family Member, Self, Pharmacy Records  clotrimazole-betamethasone (LOTRISONE) cream [id number] Yes Apply 1 Application topically 2 (two) times daily. [provider]  Active Self, Family Member, Pharmacy Records  escitalopram (LEXAPRO) 20 MG tablet [id number] Yes Take 20 mg by mouth at bedtime.  [provider]  Active Family Member, Self, Pharmacy Records  fluticasone [facility]) 50 MCG/ACT nasal spray [id number] Yes Place 1 spray into both nostrils as needed for allergies. [provider]  Active Self, Family Member, Pharmacy Records  furosemide  (LASIX ) 20 MG tablet [id number] Yes Take 20 mg by mouth daily. [provider]  Active Self, Family Member, Pharmacy Records  lisinopril  (ZESTRIL ) 10 MG tablet [id number] Yes Take 10 mg by mouth daily. [provider]  Active Self, Family Member, Pharmacy Records  methylphenidate (RITALIN) 10 MG tablet [id number] Yes Take 10 mg by mouth 2 (two) times daily. [provider]  Active Self, Family Member,  Pharmacy Records  rosuvastatin (CRESTOR) 20 MG tablet [id number] Yes Take 20 mg by mouth daily. [provider]  Active Self, Family Member, Pharmacy  Records  Sodium Bicarbonate-Citric Acid (ALKA-SELTZER PO) [id number] Yes Take 1 tablet by mouth as needed. [provider]  Active Self, Family Member, Pharmacy Records  spironolactone (ALDACTONE) 25 MG tablet [id number] Yes Take 25 mg by mouth 2 (two) times daily.  [provider]  Active Family Member, Self, Pharmacy Records  VENTOLIN HFA 108 [phone] Base) MCG/ACT inhaler [id number]  Inhale 2 puffs into the lungs every 6 (six) hours as needed for wheezing or shortness of breath.   Patient not taking: Reported on 09/05/2023   [provider]  Active Family Member, Self, Pharmacy Records           Med Note [name]   Dju Aug 11, 2023  5:15 PM) Patient in need of a refill.  vitamin B-12 (CYANOCOBALAMIN) 1000 MCG tablet [id number] Yes Take 1,000 mcg by mouth daily. [provider]  Active Self, Family Member, Pharmacy Records            Goals Addressed             This Visit's Progress    [facility]I Transitions of Care (TOC) Care Plan       Problems:  Recent Hospitalization for treatment of rhabdomyolysis and new gallbladder mass Knowledge Deficit Related to Newly found gallbladder mass  09/05/23 daughter reports patient doing well. Home health PT continues.  Cariology has cleared for surgery.  Waiting scheduling from surgeon.    Goal:  Over the next 30 days, the patient will not experience hospital readmission  Interventions:   Evaluation of current treatment plan related to rhabdomyolysis and new gallbladder mass self-management and patient's adherence to plan as established by provider. Discussed plans with patient for ongoing care management follow up and provided patient with direct contact information for care management team Provided education to patient re: any changes to contact PCP or return to ER Discussed plans with patient for ongoing care management follow up and provided patient with direct contact information for care management  team Keeping moving and stay active Keep your bones strong Go for regular eye checkups Always stand up slowly Wear proper non-slip footwear Light up your living space Use assistive devices.  PT active with patient 2/week  Patient Self Care Activities:  Attend all scheduled provider appointments Call pharmacy for medication refills 3-7 days in advance of running out of medications Call provider office for new concerns or questions  Participate in Transition of Care Program/Attend TOC scheduled calls Take medications as prescribed   Work with the social worker to address care coordination needs and will continue to work with the clinical team to address health care and disease management related needs-patient currently involved with social work. Drinking plenty of fluids Avoid spicy foods Try small frequent meals.     Plan:  Telephone follow up appointment with care management team member scheduled for:  09/12/23         Recommendation:   Continue Current Plan of Care  Follow Up Plan:   Telephone follow-up in 1 week   Nguyen Todorov J. Lashanti Chambless RN, MSN [facility], [facility] RN Care Manager Direct Dial: [phone]  Fax: [phone] Website: [email or web address]

## 2023-09-11 DIAGNOSIS — T796XXD Traumatic ischemia of muscle, subsequent encounter: Secondary | ICD-10-CM | POA: Diagnosis not present

## 2023-09-11 DIAGNOSIS — I5033 Acute on chronic diastolic (congestive) heart failure: Secondary | ICD-10-CM | POA: Diagnosis not present

## 2023-09-11 DIAGNOSIS — L03116 Cellulitis of left lower limb: Secondary | ICD-10-CM | POA: Diagnosis not present

## 2023-09-11 DIAGNOSIS — I110 Hypertensive heart disease with heart failure: Secondary | ICD-10-CM | POA: Diagnosis not present

## 2023-09-11 DIAGNOSIS — R7881 Bacteremia: Secondary | ICD-10-CM | POA: Diagnosis not present

## 2023-09-11 DIAGNOSIS — B951 Streptococcus, group B, as the cause of diseases classified elsewhere: Secondary | ICD-10-CM | POA: Diagnosis not present

## 2023-09-12 ENCOUNTER — Other Ambulatory Visit: Payer: Self-pay

## 2023-09-12 NOTE — Patient Instructions (Signed)
 Visit Information  Thank you for taking time to visit with me today. Please don't hesitate to contact me if I can be of assistance to you before our next scheduled telephone appointment.  Our next appointment is by telephone on 09/19/23 at 1200 pm  Following is a copy of your care plan:   Goals Addressed             This Visit's Progress    VBCI Transitions of Care (TOC) Care Plan       Problems:  Recent Hospitalization for treatment of rhabdomyolysis and new gallbladder mass Knowledge Deficit Related to Newly found gallbladder mass  09/21/23 daughter reports patient doing well. Home health PT continues but last visit next week.  Surgery now scheduled for 09/21/23.    Goal:  Over the next 30 days, the patient will not experience hospital readmission  Interventions:   Evaluation of current treatment plan related to rhabdomyolysis and new gallbladder mass self-management and patient's adherence to plan as established by provider. Discussed plans with patient for ongoing care management follow up and provided patient with direct contact information for care management team Provided education to patient re: any changes to contact PCP or return to ER Discussed plans with patient for ongoing care management follow up and provided patient with direct contact information for care management team Keeping moving and stay active Keep your bones strong Go for regular eye checkups Always stand up slowly Wear proper non-slip footwear Light up your living space Use assistive devices.  PT active with patient 2/week  Patient Self Care Activities:  Attend all scheduled provider appointments Call pharmacy for medication refills 3-7 days in advance of running out of medications Call provider office for new concerns or questions  Participate in Transition of Care Program/Attend TOC scheduled calls Take medications as prescribed   Work with the social worker to address care coordination needs and  will continue to work with the clinical team to address health care and disease management related needs-patient currently involved with social work. Drinking plenty of fluids Avoid spicy foods Try small frequent meals.     Plan:  Telephone follow up appointment with care management team member scheduled for:  09/19/23        Patient verbalizes understanding of instructions and care plan provided today and agrees to view in MyChart. Active MyChart status and patient understanding of how to access instructions and care plan via MyChart confirmed with patient.     The patient has been provided with contact information for the care management team and has been advised to call with any health related questions or concerns.   Please call the care guide team at [phone] if you need to cancel or reschedule your appointment.   Please call the Suicide and Crisis Lifeline: 988 if you are experiencing a Mental Health or Behavioral Health Crisis or need someone to talk to.  Jayvier Burgher J. Masaji Billups RN, MSN [facility], [facility] RN Care Manager Direct Dial: [phone]  Fax: [phone] Website: [email or web address]

## 2023-09-12 NOTE — Transitions of Care (Post Inpatient/ED Visit) (Signed)
 Transition of Care week #5  Visit Note  09/12/2023  Name: Elizabeth [name] MRN: [id number]          DOB: 04-19-1945  Situation: Patient enrolled in [facility] 30-day program. Visit completed with daughter [name] by telephone.   Background:   Initial Transition Care Management Follow-up Telephone Call    Past Medical History:  Diagnosis Date   Anxiety    Breast CA (HCC)    (Lt) breast ca dx 02/2010   Breast cancer (HCC) 03/31/2011   Depression    GERD (gastroesophageal reflux disease) 12/17/2012   Hypertension    Hypertension 12/17/2012   Nausea alone 12/17/2012   Schizophrenia (HCC)     Assessment: Patient Reported Symptoms: Cognitive Cognitive Status: Alert and oriented to person, place, and time (per daughter)      Neurological Neurological Review of Symptoms: No symptoms reported    HEENT HEENT Symptoms Reported: No symptoms reported      Cardiovascular Cardiovascular Symptoms Reported: No symptoms reported    Respiratory Respiratory Symptoms Reported: No symptoms reported    Endocrine Endocrine Symptoms Reported: No symptoms reported    Gastrointestinal Gastrointestinal Symptoms Reported: Nausea Additional Gastrointestinal Details: intermittent nausea      Genitourinary Genitourinary Symptoms Reported: Incontinence Additional Genitourinary Details: Intermittent incontinence    Integumentary Integumentary Symptoms Reported: No symptoms reported    Musculoskeletal Musculoskelatal Symptoms Reviewed: Difficulty walking, Unsteady gait Additional Musculoskeletal Details: Uses cane for ambulation-PT continues        Psychosocial           There were no vitals filed for this visit.  Medications Reviewed Today     Reviewed by Bobbye Petti, RN (Case Manager) on 09/12/23 at 1113  Med List Status: <None>   Medication Order Taking? Sig Documenting Provider Last Dose Status Informant  acetaminophen  (TYLENOL ) 500 MG tablet [id number]  Take 1,000 mg by mouth every 6  (six) hours as needed for mild pain (pain score 1-3) or moderate pain (pain score 4-6). [provider]  Active Self, Family Member, Pharmacy Records  ARIPiprazole (ABILIFY) 15 MG tablet [id number]  Take 15 mg by mouth at bedtime.  [provider]  Active Family Member, Self, Pharmacy Records  bismuth subsalicylate (PEPTO BISMOL) 262 MG/15ML suspension [id number]  Take 30 mLs by mouth every 6 (six) hours as needed for indigestion. [provider]  Active Self, Family Member, Pharmacy Records  Cholecalciferol (VITAMIN D) 125 MCG (5000 UT) CAPS [id number]  Take 5,000 Units by mouth daily. [provider]  Active Family Member, Self, Pharmacy Records  clotrimazole-betamethasone (LOTRISONE) cream [id number]  Apply 1 Application topically 2 (two) times daily. [provider]  Active Self, Family Member, Pharmacy Records  escitalopram (LEXAPRO) 20 MG tablet [id number]  Take 20 mg by mouth at bedtime.  [provider]  Active Family Member, Self, Pharmacy Records  fluticasone [facility]) 50 MCG/ACT nasal spray [id number]  Place 1 spray into both nostrils as needed for allergies. [provider]  Active Self, Family Member, Pharmacy Records  furosemide  (LASIX ) 20 MG tablet [id number]  Take 20 mg by mouth daily. [provider]  Active Self, Family Member, Pharmacy Records  lisinopril  (ZESTRIL ) 10 MG tablet [id number]  Take 10 mg by mouth daily. [provider]  Active Self, Family Member, Pharmacy Records  methylphenidate (RITALIN) 10 MG tablet [id number]  Take 10 mg by mouth 2 (two) times daily. [provider]  Active Self, Family Member, Pharmacy Records  rosuvastatin (CRESTOR) 20 MG tablet [id number]  Take 20 mg by mouth daily. [provider]  Active Self, Family Member, Pharmacy Records  Sodium Bicarbonate-Citric Acid (ALKA-SELTZER PO) [id number]  Take 1 tablet by mouth as needed. [provider]  Active Self,  Family Member, Pharmacy Records  spironolactone (ALDACTONE) 25 MG tablet [id number]  Take 25 mg by mouth 2 (two) times daily.  [provider]  Active Family Member, Self, Pharmacy Records  VENTOLIN HFA 108 [phone] Base) MCG/ACT inhaler [id number]  Inhale 2 puffs into the lungs every 6 (six) hours as needed for wheezing or shortness of breath.   Patient not taking: Reported on 09/12/2023   [provider]  Active Family Member, Self, Pharmacy Records           Med Note Elizabeth [name]   Dju Aug 11, 2023  5:15 PM) Patient in need of a refill.  vitamin B-12 (CYANOCOBALAMIN) 1000 MCG tablet [id number] Yes Take 1,000 mcg by mouth daily. [provider]  Active Self, Family Member, Pharmacy Records            Goals Addressed             This Visit's Progress    VBCI Transitions of Care (TOC) Care Plan       Problems:  Recent Hospitalization for treatment of rhabdomyolysis and new gallbladder mass Knowledge Deficit Related to Newly found gallbladder mass  09/21/23 daughter reports patient doing well. Home health PT continues but last visit next week.  Surgery now scheduled for 09/21/23.    Goal:  Over the next 30 days, the patient will not experience hospital readmission  Interventions:   Evaluation of current treatment plan related to rhabdomyolysis and new gallbladder mass self-management and patient's adherence to plan as established by provider. Discussed plans with patient for ongoing care management follow up and provided patient with direct contact information for care management team Provided education to patient re: any changes to contact PCP or return to ER Discussed plans with patient for ongoing care management follow up and provided patient with direct contact information for care management team Keeping moving and stay active Keep your bones strong Go for regular eye checkups Always stand up slowly Wear proper non-slip footwear Light up your  living space Use assistive devices.  PT active with patient 2/week  Patient Self Care Activities:  Attend all scheduled provider appointments Call pharmacy for medication refills 3-7 days in advance of running out of medications Call provider office for new concerns or questions  Participate in Transition of Care Program/Attend TOC scheduled calls Take medications as prescribed   Work with the social worker to address care coordination needs and will continue to work with the clinical team to address health care and disease management related needs-patient currently involved with social work. Drinking plenty of fluids Avoid spicy foods Try small frequent meals.     Plan:  Telephone follow up appointment with care management team member scheduled for:  09/19/23         Recommendation:   Continue Current Plan of Care  Follow Up Plan:   Telephone follow-up in 1 week   Koleson Reifsteck J. Sherion Dooly RN, MSN [facility], [facility] RN Care Manager Direct Dial: [phone]  Fax: [phone] Website: [email or web address]

## 2023-09-13 DIAGNOSIS — I5033 Acute on chronic diastolic (congestive) heart failure: Secondary | ICD-10-CM | POA: Diagnosis not present

## 2023-09-13 DIAGNOSIS — R7881 Bacteremia: Secondary | ICD-10-CM | POA: Diagnosis not present

## 2023-09-13 DIAGNOSIS — L03116 Cellulitis of left lower limb: Secondary | ICD-10-CM | POA: Diagnosis not present

## 2023-09-13 DIAGNOSIS — I110 Hypertensive heart disease with heart failure: Secondary | ICD-10-CM | POA: Diagnosis not present

## 2023-09-13 DIAGNOSIS — T796XXD Traumatic ischemia of muscle, subsequent encounter: Secondary | ICD-10-CM | POA: Diagnosis not present

## 2023-09-13 DIAGNOSIS — B951 Streptococcus, group B, as the cause of diseases classified elsewhere: Secondary | ICD-10-CM | POA: Diagnosis not present

## 2023-09-14 ENCOUNTER — Other Ambulatory Visit: Payer: Self-pay | Admitting: Licensed Clinical Social Worker

## 2023-09-14 NOTE — Patient Instructions (Signed)
 Visit Information  Thank you for taking time to visit with me today. Please don't hesitate to contact me if I can be of assistance to you before our next scheduled appointment.  Your next care management appointment is by telephone on 08/08 at 11 AM   Please call the care guide team at [phone] if you need to cancel, schedule, or reschedule an appointment.   Please call the Suicide and Crisis Lifeline: 988 go to [facility] [address] [place] [phone]) call 911 if you are experiencing a Mental Health or Behavioral Health Crisis or need someone to talk to.  Rolin Kerns, LCSW [place]  [facility], [facility] Clinical Social Worker Direct Dial: [phone]  Fax: [phone] Website: [email or web address] 12:56 PM

## 2023-09-14 NOTE — Progress Notes (Signed)
 Surgical Instructions   Your procedure is scheduled on Friday, July 18th, 2025. Report to [facility] Main Entrance A at 7:00 A.M., then check in with the Admitting office. Any questions or running late day of surgery: call [phone]  Questions prior to your surgery date: call [phone], Monday-Friday, 8am-4pm. If you experience any cold or flu symptoms such as cough, fever, chills, shortness of breath, etc. between now and your scheduled surgery, please notify us  at the above number.     Remember:  Do not eat after midnight the night before your surgery   You may drink clear liquids until 6:00 the morning of your surgery.   Clear liquids allowed are: Water , Non-Citrus Juices (without pulp), Carbonated Beverages, Clear Tea (no milk, honey, etc.), Black Coffee Only (NO MILK, CREAM OR POWDERED CREAMER of any kind), and Gatorade.    Take these medicines the morning of surgery with A SIP OF WATER : Rosuvastatin (Crestor)   May take these medicines IF NEEDED: Acetaminophen  (Tylenol ) Refresh Eye Drops Fluticasone (Flonase)    One week prior to surgery, STOP taking any Aspirin  (unless otherwise instructed by your surgeon) Aleve, Naproxen, Ibuprofen , Motrin , Advil , Goody's, BC's, all herbal medications, fish oil, and non-prescription vitamins.                     Do NOT Smoke (Tobacco/Vaping) for 24 hours prior to your procedure.  If you use a CPAP at night, you may bring your mask/headgear for your overnight stay.   You will be asked to remove any contacts, glasses, piercing's, hearing aid's, dentures/partials prior to surgery. Please bring cases for these items if needed.    Patients discharged the day of surgery will not be allowed to drive home, and someone needs to stay with them for 24 hours.  SURGICAL WAITING ROOM VISITATION Patients may have no more than 2 support people in the waiting area - these visitors may rotate.   Pre-op nurse will coordinate an appropriate  time for 1 ADULT support person, who may not rotate, to accompany patient in pre-op.  Children under the age of 6 must have an adult with them who is not the patient and must remain in the main waiting area with an adult.  If the patient needs to stay at the hospital during part of their recovery, the visitor guidelines for inpatient rooms apply.  Please refer to the [facility] website for the visitor guidelines for any additional information.   If you received a COVID test during your pre-op visit  it is requested that you wear a mask when out in public, stay away from anyone that may not be feeling well and notify your surgeon if you develop symptoms. If you have been in contact with anyone that has tested positive in the last 10 days please notify you surgeon.      Pre-operative CHG Bathing Instructions   You can play a key role in reducing the risk of infection after surgery. Your skin needs to be as free of germs as possible. You can reduce the number of germs on your skin by washing with CHG (chlorhexidine  gluconate) soap before surgery. CHG is an antiseptic soap that kills germs and continues to kill germs even after washing.   DO NOT use if you have an allergy to chlorhexidine /CHG or antibacterial soaps. If your skin becomes reddened or irritated, stop using the CHG and notify one of our RNs at [phone].  TAKE A SHOWER THE NIGHT BEFORE SURGERY AND THE DAY OF SURGERY    Please keep in mind the following:  DO NOT shave, including legs and underarms, 48 hours prior to surgery.   You may shave your face before/day of surgery.  Place clean sheets on your bed the night before surgery Use a clean washcloth (not used since being washed) for each shower. DO NOT sleep with pet's night before surgery.  CHG Shower Instructions:  Wash your face and private area with normal soap. If you choose to wash your hair, wash first with your normal shampoo.  After you use  shampoo/soap, rinse your hair and body thoroughly to remove shampoo/soap residue.  Turn the water  OFF and apply half the bottle of CHG soap to a CLEAN washcloth.  Apply CHG soap ONLY FROM YOUR NECK DOWN TO YOUR TOES (washing for 3-5 minutes)  DO NOT use CHG soap on face, private areas, open wounds, or sores.  Pay special attention to the area where your surgery is being performed.  If you are having back surgery, having someone wash your back for you may be helpful. Wait 2 minutes after CHG soap is applied, then you may rinse off the CHG soap.  Pat dry with a clean towel  Put on clean pajamas    Additional instructions for the day of surgery: DO NOT APPLY any lotions, deodorants, cologne, or perfumes.   Do not wear jewelry or makeup Do not wear nail polish, gel polish, artificial nails, or any other type of covering on natural nails (fingers and toes) Do not bring valuables to the hospital. [facility] is not responsible for valuables/personal belongings. Put on clean/comfortable clothes.  Please brush your teeth.  Ask your nurse before applying any prescription medications to the skin.

## 2023-09-14 NOTE — Patient Outreach (Signed)
 Complex Care Management   Visit Note  09/14/2023  Name:  Elizabeth [name] MRN: [id number] DOB: 1945-11-09  Situation: Referral received for Complex Care Management related to Heart Failure I obtained verbal consent from Caregiver.  Visit completed with adult daughter [name]  on the phone  Background:   Past Medical History:  Diagnosis Date   Anxiety    Breast CA (HCC)    (Lt) breast ca dx 02/2010   Breast cancer (HCC) 03/31/2011   Depression    GERD (gastroesophageal reflux disease) 12/17/2012   Hypertension    Hypertension 12/17/2012   Nausea alone 12/17/2012   Schizophrenia (HCC)     Assessment: Patient Reported Symptoms:  Cognitive Cognitive Status: Alert and oriented to person, place, and time Cognitive/Intellectual Conditions Management [RPT]: None reported or documented in medical history or problem list      Neurological Neurological Review of Symptoms: Not assessed    HEENT HEENT Symptoms Reported: Not assessed      Cardiovascular Cardiovascular Symptoms Reported: Not assessed    Respiratory Respiratory Symptoms Reported: Not assesed    Endocrine Endocrine Symptoms Reported: Not assessed    Gastrointestinal Gastrointestinal Symptoms Reported: Not assessed      Genitourinary Genitourinary Symptoms Reported: Not assessed    Integumentary Integumentary Symptoms Reported: Not assessed    Musculoskeletal Musculoskelatal Symptoms Reviewed: Not assessed        Psychosocial Psychosocial Symptoms Reported: No symptoms reported Additional Psychological Details: Pt has an upcoming surgery scheduled and LCW will f/up with family to assess for any additional support needs Behavioral Management Strategies: Coping strategies, Support system Major Change/Loss/Stressor/Fears (CP): Medical condition, self         No data to display          There were no vitals filed for this visit.  Medications Reviewed Today     Reviewed by Ezzard Rolin BIRCH, LCSW (Social  Worker) on 09/14/23 at 1251  Med List Status: <None>   Medication Order Taking? Sig Documenting Provider Last Dose Status Informant  acetaminophen  (TYLENOL ) 500 MG tablet [id number]  Take 1,000 mg by mouth every 6 (six) hours as needed for mild pain (pain score 1-3) or moderate pain (pain score 4-6). [provider]  Active Self  ARIPiprazole (ABILIFY) 15 MG tablet [id number]  Take 15 mg by mouth at bedtime.  [provider]  Active Self  bismuth subsalicylate (PEPTO BISMOL) 262 MG/15ML suspension [id number]  Take 30 mLs by mouth every 6 (six) hours as needed for indigestion. [provider]  Active Self  carboxymethylcellulose (REFRESH PLUS) 0.5 % SOLN [id number]  Place 1 drop into both eyes 3 (three) times daily as needed (dry/irritated eyes). [provider]  Active Self  Cholecalciferol (VITAMIN D) 125 MCG (5000 UT) CAPS [id number]  Take 5,000 Units by mouth daily. [provider]  Active Self  clotrimazole-betamethasone (LOTRISONE) cream [id number]  Apply 1 Application topically 2 (two) times daily as needed (itching). [provider]  Active Self  escitalopram (LEXAPRO) 20 MG tablet [id number]  Take 20 mg by mouth at bedtime.  [provider]  Active Self  fluticasone (FLONASE) 50 MCG/ACT nasal spray [id number]  Place 1 spray into both nostrils as needed for allergies. [provider]  Active Self  furosemide  (LASIX ) 20 MG tablet [id number]  Take 20 mg by mouth daily. [provider]  Active Self  lisinopril  (ZESTRIL ) 10 MG tablet [id number]  Take 10 mg by mouth daily. [provider]  Active Self  methylphenidate (RITALIN)  10 MG tablet [id number]  Take 10 mg by mouth in the morning. [provider]  Active Self  rosuvastatin (CRESTOR) 20 MG tablet [id number]  Take 20 mg by mouth daily. [provider]  Active Self  spironolactone (ALDACTONE) 25 MG tablet [id number]  Take 25 mg by mouth 2 (two) times  daily. [provider]  Active Self  VENTOLIN HFA 108 (90 Base) MCG/ACT inhaler [id number]  Inhale 2 puffs into the lungs every 6 (six) hours as needed for wheezing or shortness of breath.   Patient not taking: Reported on 08/22/2023   [provider]  Active Self           Med Note [name]   Sat Aug 11, 2023  5:15 PM) Patient in need of a refill.  vitamin B-12 (CYANOCOBALAMIN) 1000 MCG tablet [id number]  Take 1,000 mcg by mouth daily. [provider]  Active Self            Recommendation:   Continue Current Plan of Care  Follow Up Plan:   Telephone follow-up 2-4 weeks  Rolin Kerns, LCSW [facility], [facility] Clinical Social Worker Direct Dial: [phone]  Fax: [phone] Website: [email or web address] 12:56 PM

## 2023-09-17 ENCOUNTER — Inpatient Hospital Stay (HOSPITAL_COMMUNITY)
Admission: RE | Admit: 2023-09-17 | Discharge: 2023-09-17 | Disposition: A | Discharge status: Home / Self Care | Source: Ambulatory Visit | Attending: Surgery

## 2023-09-17 ENCOUNTER — Other Ambulatory Visit: Payer: Self-pay

## 2023-09-17 ENCOUNTER — Encounter (HOSPITAL_COMMUNITY): Payer: Self-pay

## 2023-09-17 ENCOUNTER — Ambulatory Visit: Payer: Self-pay | Admitting: Surgery

## 2023-09-17 VITALS — BP 118/51 | HR 66 | Temp 98.1°F | Resp 19 | Ht 61.0 in | Wt 218.7 lb

## 2023-09-17 DIAGNOSIS — E66813 Obesity, class 3: Secondary | ICD-10-CM | POA: Diagnosis not present

## 2023-09-17 DIAGNOSIS — R600 Localized edema: Secondary | ICD-10-CM | POA: Insufficient documentation

## 2023-09-17 DIAGNOSIS — R0609 Other forms of dyspnea: Secondary | ICD-10-CM | POA: Diagnosis not present

## 2023-09-17 DIAGNOSIS — Z9012 Acquired absence of left breast and nipple: Secondary | ICD-10-CM | POA: Insufficient documentation

## 2023-09-17 DIAGNOSIS — I5030 Unspecified diastolic (congestive) heart failure: Secondary | ICD-10-CM | POA: Diagnosis not present

## 2023-09-17 DIAGNOSIS — Z8542 Personal history of malignant neoplasm of other parts of uterus: Secondary | ICD-10-CM | POA: Insufficient documentation

## 2023-09-17 DIAGNOSIS — Z853 Personal history of malignant neoplasm of breast: Secondary | ICD-10-CM | POA: Insufficient documentation

## 2023-09-17 DIAGNOSIS — G4733 Obstructive sleep apnea (adult) (pediatric): Secondary | ICD-10-CM | POA: Diagnosis not present

## 2023-09-17 DIAGNOSIS — Z01818 Encounter for other preprocedural examination: Secondary | ICD-10-CM

## 2023-09-17 DIAGNOSIS — I110 Hypertensive heart disease with heart failure: Secondary | ICD-10-CM | POA: Diagnosis not present

## 2023-09-17 DIAGNOSIS — I081 Rheumatic disorders of both mitral and tricuspid valves: Secondary | ICD-10-CM | POA: Insufficient documentation

## 2023-09-17 DIAGNOSIS — Z01812 Encounter for preprocedural laboratory examination: Secondary | ICD-10-CM | POA: Insufficient documentation

## 2023-09-17 DIAGNOSIS — Z6841 Body Mass Index (BMI) 40.0 and over, adult: Secondary | ICD-10-CM | POA: Insufficient documentation

## 2023-09-17 DIAGNOSIS — K828 Other specified diseases of gallbladder: Secondary | ICD-10-CM

## 2023-09-17 DIAGNOSIS — I10 Essential (primary) hypertension: Secondary | ICD-10-CM

## 2023-09-17 HISTORY — DX: Unspecified diastolic (congestive) heart failure: I50.30

## 2023-09-17 HISTORY — DX: Dyspnea, unspecified: R06.00

## 2023-09-17 HISTORY — DX: Disorder of arteries and arterioles, unspecified: I77.9

## 2023-09-17 HISTORY — DX: Unspecified osteoarthritis, unspecified site: M19.90

## 2023-09-17 LAB — BASIC METABOLIC PANEL WITH GFR
Anion gap: 9 (ref 5–15)
BUN: 20 mg/dL (ref 8–23)
CO2: 26 mmol/L (ref 22–32)
Calcium: 9.4 mg/dL (ref 8.9–10.3)
Chloride: 102 mmol/L (ref 98–111)
Creatinine, Ser: 1.11 mg/dL — ABNORMAL HIGH (ref 0.44–1.00)
GFR, Estimated: 51 mL/min — ABNORMAL LOW (ref >60)
Glucose, Bld: 117 mg/dL — ABNORMAL HIGH (ref 70–99)
Potassium: 4.4 mmol/L (ref 3.5–5.1)
Sodium: 137 mmol/L (ref 135–145)

## 2023-09-17 LAB — CBC
HCT: 37.1 % (ref 36.0–46.0)
Hemoglobin: 12 g/dL (ref 12.0–15.0)
MCH: 29.6 pg (ref 26.0–34.0)
MCHC: 32.3 g/dL (ref 30.0–36.0)
MCV: 91.4 fL (ref 80.0–100.0)
Platelets: 197 K/uL (ref 150–400)
RBC: 4.06 MIL/uL (ref 3.87–5.11)
RDW: 14.4 % (ref 11.5–15.5)
WBC: 8 K/uL (ref 4.0–10.5)
nRBC: 0 % (ref 0.0–0.2)

## 2023-09-17 NOTE — Progress Notes (Signed)
   09/17/23 0935  OBSTRUCTIVE SLEEP APNEA  Have you ever been diagnosed with sleep apnea through a sleep study? No  Do you snore loudly (loud enough to be heard through closed doors)?  1  Do you often feel tired, fatigued, or sleepy during the daytime (such as falling asleep during driving or talking to someone)? 0  Has anyone observed you stop breathing during your sleep? 0  Do you have, or are you being treated for high blood pressure? 1  BMI more than 35 kg/m2? 1  Age > 50 (1-yes) 1  Neck circumference greater than:Female 16 inches or larger, Female 17inches or larger? 1  Female Gender (Yes=1) 0  Obstructive Sleep Apnea Score 5

## 2023-09-17 NOTE — Progress Notes (Signed)
 PCP - Dr. Zachary Osei-Bonsu Cardiologist - Dr. Gordy Bergamo  PPM/ICD - denies   Chest x-ray - 08/11/23 EKG - 09/04/23 Stress Test - 04/16/19 ECHO - 12/29/21 Cardiac Cath - denies  Sleep Study - pt has had sleep study but says she was not diagnosed with OSA CPAP - denies STOP BANG score of 5 faxed to PCP  DM- denies  Last dose of GLP1 agonist-  n/a   ASA/Blood Thinner Instructions: n/a   ERAS Protcol - clears until 0600   COVID TEST- n/a   Anesthesia review: yes, cardiac hx  Patient denies shortness of breath, fever, cough and chest pain at PAT appointment   All instructions explained to the patient, with a verbal understanding of the material. Patient agrees to go over the instructions while at home for a better understanding.  The opportunity to ask questions was provided.

## 2023-09-18 NOTE — Anesthesia Preprocedure Evaluation (Signed)
 Anesthesia Evaluation  Patient identified by MRN, date of birth, ID band Patient awake    Reviewed: Allergy & Precautions, NPO status , Patient's Chart, lab work & pertinent test results  History of Anesthesia Complications Negative for: history of anesthetic complications  Airway Mallampati: III  TM Distance: >3 FB Neck ROM: Full    Dental no notable dental hx. (+) Teeth Intact   Pulmonary shortness of breath and with exertion, neg sleep apnea, neg COPD, Patient abstained from smoking.Not current smoker   Pulmonary exam normal breath sounds clear to auscultation       Cardiovascular Exercise Tolerance: Good METShypertension, Pt. on medications +CHF  (-) CAD and (-) Past MI (-) dysrhythmias  Rhythm:Regular Rate:Normal - Systolic murmurs PAT note by Lynwood Hope, PA-C: 78 year old female follows with cardiology for history of chronic DOE, lower extremity edema, HTN, HLD, HFpEF, mild mitral regurgitation, OSA noncompliant with CPAP.  Echo 12/2021 showed EF 60 to 65%, normal wall motion, mild mitral regurgitation, mild tricuspid regurgitation.  Stress test 04/2019 was low risk.  Last seen by Lum Louis, NP on 1 09/04/2023 for preop evaluation.  Per note, According to the Revised Cardiac Risk Index (RCRI), her Perioperative Risk of Major Cardiac Event is (%): 0.9. Her Functional Capacity in METs is: 5.07 according to the Duke Activity Status Index (DASI). Therefore, based on ACC/AHA guidelines, patient would be at acceptable risk for the planned procedure without further cardiovascular testing.  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  Preop labs reviewed, unremarkable.  EKG 09/04/2023: NSR.  Rate 65.  Minimal voltage criteria for LVH.  Echocardiogram 12/29/2021:  Normal LV systolic function with visual  EF 60-65%. Left ventricle cavity  is normal in size. Normal left ventricular wall thickness. Normal global  wall motion. Indeterminate diastolic filling pattern. Calculated EF 68%.  Structurally normal mitral valve. Mild (Grade I) mitral regurgitation.  Structurally normal tricuspid valve. Mild tricuspid regurgitation. No  evidence of pulmonary hypertension. RVSP measures 35 mmHg.  No significant change compared to 05/2021.   Lexiscan  (Walking with mod Bruce)Tetrofosmin  Stress Test  04/16/2019: Nondiagnostic ECG stress. There is a fixed moderate defect in the inferior and apical regions consistent with soft tissue attenuation. Overall LV systolic function is normal without regional wall motion abnormalities. Stress LV EF: 71%.  No previous exam available for comparison. Low risk study.       Neuro/Psych  PSYCHIATRIC DISORDERS Anxiety Depression  Schizophrenia     GI/Hepatic ,GERD  Controlled,,(+)     (-) substance abuse    Endo/Other  neg diabetes  Class 3 obesity  Renal/GU negative Renal ROS     Musculoskeletal   Abdominal  (+) + obese  Peds  Hematology   Anesthesia Other Findings Past Medical History: No date: Anxiety No date: Arthritis     Comment:  right knee No date: Breast CA (HCC)     Comment:  (Lt) breast ca dx 02/2010 03/31/2011: Breast cancer (HCC) No date: Carotid artery disease (HCC) No date: Depression No date: Diastolic HF (heart failure) (HCC) No date: Dyspnea 12/17/2012: GERD (gastroesophageal reflux disease) No date: Hypertension 12/17/2012: Hypertension 12/17/2012: Nausea alone No date: Schizophrenia [facility])  Reproductive/Obstetrics                              Anesthesia Physical Anesthesia Plan  ASA: 3  Anesthesia Plan: General  Post-op Pain Management: Ofirmev  IV (intra-op)*, Dilaudid  IV and Tylenol  PO (pre-op)*   Induction: Intravenous  PONV Risk Score and Plan: 4 or greater and Ondansetron ,  Dexamethasone  and Treatment may vary due to age or medical condition  Airway Management Planned: Oral ETT  Additional Equipment: None  Intra-op Plan:   Post-operative Plan: Extubation in OR  Informed Consent: I have reviewed the patients History and Physical, chart, labs and discussed the procedure including the risks, benefits and alternatives for the proposed anesthesia with the patient or authorized representative who has indicated his/her understanding and acceptance.     Dental advisory given  Plan Discussed with: CRNA and Surgeon  Anesthesia Plan Comments: (Discussed risks of anesthesia with patient, including PONV, sore throat, lip/dental/eye damage. Rare risks discussed as well, such as cardiorespiratory and neurological sequelae, and allergic reactions. Discussed the role of CRNA in patient's perioperative care. Patient understands. I did discuss the potential increased risks posed if the surgery ended up turning into an open partial hepatectomy, which the consent lists as a possibility. This includes bleeding requiring blood products, and invasive arterial access. Patient understands.)         Anesthesia Quick Evaluation

## 2023-09-18 NOTE — Progress Notes (Signed)
 Anesthesia Chart Review:  78 year old female follows with cardiology for history of chronic DOE, lower extremity edema, HTN, HLD, HFpEF, mild mitral regurgitation, OSA noncompliant with CPAP.  Echo 12/2021 showed EF 60 to 65%, normal wall motion, mild mitral regurgitation, mild tricuspid regurgitation.  Stress test 04/2019 was low risk.  Last seen by Lum Louis, NP on 1 09/04/2023 for preop evaluation.  Per note, According to the Revised Cardiac Risk Index (RCRI), her Perioperative Risk of Major Cardiac Event is (%): 0.9. Her Functional Capacity in METs is: 5.07 according to the Duke Activity Status Index (DASI). Therefore, based on ACC/AHA guidelines, patient would be at acceptable risk for the planned procedure without further cardiovascular testing.  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  Preop labs reviewed, unremarkable.  EKG 09/04/2023: NSR.  Rate 65.  Minimal voltage criteria for LVH.  Echocardiogram 12/29/2021:  Normal LV systolic function with visual EF 60-65%. Left ventricle cavity  is normal in size. Normal left ventricular wall thickness. Normal global  wall motion. Indeterminate diastolic filling pattern. Calculated EF 68%.  Structurally normal mitral valve.  Mild (Grade I) mitral regurgitation.  Structurally normal tricuspid valve.  Mild tricuspid regurgitation. No  evidence of pulmonary hypertension. RVSP measures 35 mmHg.  No significant change compared to 05/2021.   Lexiscan  (Walking with mod Bruce)Tetrofosmin  Stress Test  04/16/2019: Nondiagnostic ECG stress. There is a fixed moderate defect in the inferior and apical regions consistent with soft tissue attenuation. Overall LV systolic function is normal without regional wall motion abnormalities. Stress LV EF: 71%.  No previous exam available for comparison. Low risk study.     Lynwood Geofm [facility]/Anesthesiology Phone [phone] 09/18/2023 2:27 PM

## 2023-09-19 ENCOUNTER — Other Ambulatory Visit: Payer: Self-pay

## 2023-09-19 NOTE — Patient Instructions (Signed)
 Visit Information  Thank you for taking time to visit with me today. Please don't hesitate to contact me if I can be of assistance to you. Following is a copy of your care plan:   Goals Addressed             This Visit's Progress    VBCI Transitions of Care (TOC) Care Plan       Problems:  Recent Hospitalization for treatment of rhabdomyolysis and new gallbladder mass Knowledge Deficit Related to Newly found gallbladder mass  09/19/23 daughter reports patient doing well. Prepping for surgery. Not sure if patient will be admitted.  Home health PT complete.  Surgery now scheduled for 09/21/23.    Goal:  Over the next 30 days, the patient will not experience hospital readmission  Interventions:   Evaluation of current treatment plan related to rhabdomyolysis and new gallbladder mass self-management and patient's adherence to plan as established by provider. Discussed plans with patient for ongoing care management follow up and provided patient with direct contact information for care management team Provided education to patient re: any changes to contact PCP or return to ER Discussed plans with patient for ongoing care management follow up and provided patient with direct contact information for care management team Keeping moving and stay active Keep your bones strong Go for regular eye checkups Always stand up slowly Wear proper non-slip footwear Light up your living space Use assistive devices.    Patient Self Care Activities:  Attend all scheduled provider appointments Call pharmacy for medication refills 3-7 days in advance of running out of medications Call provider office for new concerns or questions  Participate in Transition of Care Program/Attend TOC scheduled calls Take medications as prescribed   Work with the social worker to address care coordination needs and will continue to work with the clinical team to address health care and disease management related  needs-patient currently involved with social work. Drinking plenty of fluids Avoid spicy foods Try small frequent meals.     Plan:  RN CM will monitor surgical status of patient and outreach as appropriate        Patient verbalizes understanding of instructions and care plan provided today and agrees to view in MyChart. Active MyChart status and patient understanding of how to access instructions and care plan via MyChart confirmed with patient.     The patient has been provided with contact information for the care management team and has been advised to call with any health related questions or concerns.   Please call the care guide team at [phone] if you need to cancel or reschedule your appointment.   Please call the Suicide and Crisis Lifeline: 988 if you are experiencing a Mental Health or Behavioral Health Crisis or need someone to talk to.   Pheonix Clinkscale J. Ayanni Tun RN, MSN [facility], [facility] Health RN Care Manager Direct Dial: [phone]  Fax: [phone] Website: [email or web address]

## 2023-09-19 NOTE — Transitions of Care (Post Inpatient/ED Visit) (Signed)
 Transition of Care Week 6  Visit Note  09/19/2023  Name: Elizabeth [name] MRN: [id number]          DOB: 1945/04/22  Situation: Patient enrolled in [facility] program. [facility]. Patient ready for surgery on Friday. Pre-op completed.  Will monitor surgical status and outreach as appropriate.   Background:   Initial Transition Care Management Follow-up Telephone Call    Past Medical History:  Diagnosis Date   Anxiety    Arthritis    right knee   Breast CA (HCC)    (Lt) breast ca dx 02/2010   Breast cancer (HCC) 03/31/2011   Carotid artery disease (HCC)    Depression    Diastolic HF (heart failure) (HCC)    Dyspnea    GERD (gastroesophageal reflux disease) 12/17/2012   Hypertension    Hypertension 12/17/2012   Nausea alone 12/17/2012   Schizophrenia (HCC)     Assessment: Patient Reported Symptoms: Cognitive Cognitive Status: Alert and oriented to person, place, and time      Neurological Neurological Review of Symptoms: Not assessed    HEENT HEENT Symptoms Reported: Not assessed      Cardiovascular Cardiovascular Symptoms Reported: Not assessed    Respiratory Respiratory Symptoms Reported: No symptoms reported    Endocrine Endocrine Symptoms Reported: No symptoms reported    Gastrointestinal Gastrointestinal Symptoms Reported: No symptoms reported      Genitourinary Genitourinary Symptoms Reported: No symptoms reported Additional Genitourinary Details: Intermittent incontinence    Integumentary Integumentary Symptoms Reported: No symptoms reported    Musculoskeletal Musculoskelatal Symptoms Reviewed: Difficulty walking, Unsteady gait Additional Musculoskeletal Details: Uses cane for ambulation        Psychosocial Psychosocial Symptoms Reported: Not assessed         There were no vitals filed for this visit.  Medications Reviewed Today     Reviewed by Dorothie Wah, RN (Case Manager) on 09/19/23 at 1213   Med List Status: <None>   Medication Order Taking? Sig Documenting Provider Last Dose Status Informant  acetaminophen  (TYLENOL ) 500 MG tablet [id number] Yes Take 1,000 mg by mouth every 6 (six) hours as needed for mild pain (pain score 1-3) or moderate pain (pain score 4-6). [provider]  Active Self  ARIPiprazole (ABILIFY) 15 MG tablet [id number] Yes Take 15 mg by mouth at bedtime.  [provider]  Active Self  bismuth subsalicylate (PEPTO BISMOL) 262 MG/15ML suspension [id number] Yes Take 30 mLs by mouth every 6 (six) hours as needed for indigestion. [provider]  Active Self  carboxymethylcellulose (REFRESH PLUS) 0.5 % SOLN [id number] Yes Place 1 drop into both eyes 3 (three) times daily as needed (dry/irritated eyes). [provider]  Active Self  Cholecalciferol (VITAMIN D) 125 MCG (5000 UT) CAPS [id number] Yes Take 5,000 Units by mouth daily. [provider]  Active Self  clotrimazole-betamethasone (LOTRISONE) cream [id number] Yes Apply 1 Application topically 2 (two) times daily as needed (itching). [provider]  Active Self  escitalopram (LEXAPRO) 20 MG tablet [id number] Yes Take 20 mg by mouth at bedtime.  [provider]  Active Self  fluticasone (FLONASE) 50 MCG/ACT nasal spray [id number] Yes Place 1 spray into both nostrils as needed for allergies. [provider]  Active Self  furosemide  (LASIX ) 20 MG tablet [id number] Yes Take 20 mg by mouth daily. [provider]  Active Self  lisinopril  (ZESTRIL ) 10 MG tablet [id number] Yes Take 10 mg by mouth daily. [provider]  Active Self  methylphenidate (RITALIN) 10 MG tablet [id number] Yes Take 10 mg by mouth in the morning. [provider]  Active Self  rosuvastatin (CRESTOR) 20 MG tablet [id number] Yes Take 20 mg by mouth daily. [provider]  Active Self  spironolactone (ALDACTONE) 25 MG tablet [id number] Yes Take 25 mg by mouth 2  (two) times daily. [provider]  Active Self  VENTOLIN HFA 108 (90 Base) MCG/ACT inhaler [id number]  Inhale 2 puffs into the lungs every 6 (six) hours as needed for wheezing or shortness of breath.   Patient not taking: Reported on 09/19/2023   [provider]  Active Self           Med Note [name]   Sat Aug 11, 2023  5:15 PM) Patient in need of a refill.  vitamin B-12 (CYANOCOBALAMIN) 1000 MCG tablet [id number] Yes Take 1,000 mcg by mouth daily. [provider]  Active Self            Recommendation:   Follow up pending surgical status  Follow Up Plan:   Follow up as appropriate after surgery.  Khamila Bassinger J. Laverle Pillard RN, MSN [facility], [facility] RN Care Manager Direct Dial: [phone]  Fax: [phone] Website: [email or web address]

## 2023-09-21 ENCOUNTER — Other Ambulatory Visit: Payer: Self-pay

## 2023-09-21 ENCOUNTER — Encounter (HOSPITAL_COMMUNITY): Payer: Self-pay | Admitting: Surgery

## 2023-09-21 ENCOUNTER — Ambulatory Visit (HOSPITAL_COMMUNITY)
Admission: RE | Admit: 2023-09-21 | Discharge: 2023-09-21 | Disposition: A | Discharge status: Home / Self Care | Attending: Surgery | Admitting: Surgery

## 2023-09-21 ENCOUNTER — Inpatient Hospital Stay (HOSPITAL_COMMUNITY): Payer: Self-pay | Admitting: Physician Assistant

## 2023-09-21 ENCOUNTER — Inpatient Hospital Stay (HOSPITAL_COMMUNITY): Payer: Self-pay | Admitting: Anesthesiology

## 2023-09-21 ENCOUNTER — Encounter (HOSPITAL_COMMUNITY)
Admission: RE | Disposition: A | Discharge status: Home / Self Care | Payer: Self-pay | Source: Home / Self Care | Attending: Surgery

## 2023-09-21 DIAGNOSIS — I272 Pulmonary hypertension, unspecified: Secondary | ICD-10-CM | POA: Diagnosis not present

## 2023-09-21 DIAGNOSIS — I5032 Chronic diastolic (congestive) heart failure: Secondary | ICD-10-CM | POA: Insufficient documentation

## 2023-09-21 DIAGNOSIS — D135 Benign neoplasm of extrahepatic bile ducts: Secondary | ICD-10-CM | POA: Diagnosis not present

## 2023-09-21 DIAGNOSIS — I509 Heart failure, unspecified: Secondary | ICD-10-CM | POA: Diagnosis not present

## 2023-09-21 DIAGNOSIS — I10 Essential (primary) hypertension: Secondary | ICD-10-CM | POA: Diagnosis not present

## 2023-09-21 DIAGNOSIS — K811 Chronic cholecystitis: Secondary | ICD-10-CM | POA: Insufficient documentation

## 2023-09-21 DIAGNOSIS — M6282 Rhabdomyolysis: Secondary | ICD-10-CM | POA: Diagnosis not present

## 2023-09-21 DIAGNOSIS — D134 Benign neoplasm of liver: Secondary | ICD-10-CM | POA: Diagnosis not present

## 2023-09-21 DIAGNOSIS — Z6841 Body Mass Index (BMI) 40.0 and over, adult: Secondary | ICD-10-CM | POA: Diagnosis not present

## 2023-09-21 DIAGNOSIS — I110 Hypertensive heart disease with heart failure: Secondary | ICD-10-CM | POA: Diagnosis not present

## 2023-09-21 DIAGNOSIS — K828 Other specified diseases of gallbladder: Principal | ICD-10-CM | POA: Insufficient documentation

## 2023-09-21 LAB — PREPARE RBC (CROSSMATCH)

## 2023-09-21 SURGERY — CHOLECYSTECTOMY, ROBOT-ASSISTED, LAPAROSCOPIC
Anesthesia: General

## 2023-09-21 MED ORDER — LACTATED RINGERS IV SOLN: INTRAVENOUS | Status: DC

## 2023-09-21 MED ORDER — CEFAZOLIN SODIUM-DEXTROSE 2-4 GM/100ML-% IV SOLN
2.0000 g | INTRAVENOUS | Status: AC
  Administered 2023-09-21: 2 g via INTRAVENOUS
  Filled 2023-09-21: qty 100

## 2023-09-21 MED ORDER — ORAL CARE MOUTH RINSE: 15.0000 mL | Freq: Once | OROMUCOSAL | Status: AC

## 2023-09-21 MED ORDER — LACTATED RINGERS IV SOLN: INTRAVENOUS | Status: DC | PRN

## 2023-09-21 MED ORDER — INDOCYANINE GREEN 25 MG IV SOLR
1.2500 mg | Freq: Once | INTRAVENOUS | Status: AC
  Administered 2023-09-21: 1.25 mg via INTRAVENOUS
  Filled 2023-09-21: qty 10

## 2023-09-21 MED ORDER — CHLORHEXIDINE GLUCONATE 0.12 % MT SOLN
15.0000 mL | Freq: Once | OROMUCOSAL | Status: AC
  Administered 2023-09-21: 15 mL via OROMUCOSAL
  Filled 2023-09-21: qty 15

## 2023-09-21 MED ORDER — DEXAMETHASONE SODIUM PHOSPHATE 10 MG/ML IJ SOLN
INTRAMUSCULAR | Status: DC | PRN
  Administered 2023-09-21: 10 mg via INTRAVENOUS

## 2023-09-21 MED ORDER — BUPIVACAINE-EPINEPHRINE 0.25% -1:200000 IJ SOLN
INTRAMUSCULAR | Status: DC | PRN
Start: 2023-09-21 — End: 2023-09-21
  Administered 2023-09-21: 30 mL

## 2023-09-21 MED ORDER — OXYCODONE HCL 5 MG PO TABS: 5.0000 mg | ORAL_TABLET | Freq: Four times a day (QID) | ORAL | 0 refills | Status: AC | PRN

## 2023-09-21 MED ORDER — OXYCODONE HCL 5 MG/5ML PO SOLN: 5.0000 mg | Freq: Once | ORAL | Status: AC | PRN

## 2023-09-21 MED ORDER — PROPOFOL 10 MG/ML IV BOLUS
INTRAVENOUS | Status: DC | PRN
  Administered 2023-09-21: 150 mg via INTRAVENOUS

## 2023-09-21 MED ORDER — HEMOSTATIC AGENTS (NO CHARGE) OPTIME
TOPICAL | Status: DC | PRN
Start: 2023-09-21 — End: 2023-09-21
  Administered 2023-09-21: 1 via TOPICAL

## 2023-09-21 MED ORDER — OXYCODONE HCL 5 MG PO TABS
ORAL_TABLET | ORAL | Status: AC
Start: 2023-09-21 — End: 2023-09-21
  Filled 2023-09-21: qty 1

## 2023-09-21 MED ORDER — FENTANYL CITRATE (PF) 250 MCG/5ML IJ SOLN
INTRAMUSCULAR | Status: DC | PRN
  Administered 2023-09-21: 50 ug via INTRAVENOUS
  Administered 2023-09-21: 100 ug via INTRAVENOUS
  Administered 2023-09-21 (×2): 50 ug via INTRAVENOUS

## 2023-09-21 MED ORDER — ACETAMINOPHEN 10 MG/ML IV SOLN
1000.0000 mg | Freq: Once | INTRAVENOUS | Status: DC | PRN
Start: 2023-09-21 — End: 2023-09-22

## 2023-09-21 MED ORDER — 0.9 % SODIUM CHLORIDE (POUR BTL) OPTIME
TOPICAL | Status: DC | PRN
  Administered 2023-09-21: 1000 mL

## 2023-09-21 MED ORDER — ONDANSETRON HCL 4 MG/2ML IJ SOLN
INTRAMUSCULAR | Status: DC | PRN
  Administered 2023-09-21: 4 mg via INTRAVENOUS

## 2023-09-21 MED ORDER — ONDANSETRON HCL 4 MG/2ML IJ SOLN: 4.0000 mg | Freq: Once | INTRAMUSCULAR | Status: DC | PRN

## 2023-09-21 MED ORDER — PROPOFOL 10 MG/ML IV BOLUS
INTRAVENOUS | Status: AC
  Filled 2023-09-21: qty 20

## 2023-09-21 MED ORDER — OXYCODONE HCL 5 MG PO TABS
5.0000 mg | ORAL_TABLET | Freq: Once | ORAL | Status: AC | PRN
  Administered 2023-09-21: 5 mg via ORAL

## 2023-09-21 MED ORDER — SUGAMMADEX SODIUM 200 MG/2ML IV SOLN
INTRAVENOUS | Status: DC | PRN
  Administered 2023-09-21: 250 mg via INTRAVENOUS
  Administered 2023-09-21: 200 mg via INTRAVENOUS

## 2023-09-21 MED ORDER — FENTANYL CITRATE (PF) 250 MCG/5ML IJ SOLN
INTRAMUSCULAR | Status: AC
  Filled 2023-09-21: qty 5

## 2023-09-21 MED ORDER — ROCURONIUM BROMIDE 10 MG/ML (PF) SYRINGE
PREFILLED_SYRINGE | INTRAVENOUS | Status: DC | PRN
  Administered 2023-09-21: 10 mg via INTRAVENOUS
  Administered 2023-09-21: 50 mg via INTRAVENOUS

## 2023-09-21 MED ORDER — BUPIVACAINE-EPINEPHRINE (PF) 0.25% -1:200000 IJ SOLN
INTRAMUSCULAR | Status: AC
  Filled 2023-09-21: qty 30

## 2023-09-21 MED ORDER — PHENYLEPHRINE HCL-NACL 20-0.9 MG/250ML-% IV SOLN
INTRAVENOUS | Status: AC
  Filled 2023-09-21: qty 250

## 2023-09-21 MED ORDER — PHENYLEPHRINE 80 MCG/ML (10ML) SYRINGE FOR IV PUSH (FOR BLOOD PRESSURE SUPPORT)
PREFILLED_SYRINGE | INTRAVENOUS | Status: DC | PRN
  Administered 2023-09-21: 120 ug via INTRAVENOUS

## 2023-09-21 MED ORDER — SODIUM CHLORIDE 0.9 % IR SOLN
Status: DC | PRN
  Administered 2023-09-21: 1000 mL

## 2023-09-21 MED ORDER — FENTANYL CITRATE (PF) 100 MCG/2ML IJ SOLN: 25.0000 ug | INTRAMUSCULAR | Status: DC | PRN

## 2023-09-21 MED ORDER — LIDOCAINE 2% (20 MG/ML) 5 ML SYRINGE
INTRAMUSCULAR | Status: DC | PRN
  Administered 2023-09-21: 100 mg via INTRAVENOUS

## 2023-09-21 SURGICAL SUPPLY — 109 items
BAG COUNTER SPONGE SURGICOUNT (BAG) ×1 IMPLANT
BIOPATCH RED 1 DISK 7.0 (GAUZE/BANDAGES/DRESSINGS) ×2 IMPLANT
BLADE CLIPPER SURG (BLADE) IMPLANT
BLADE EXTENDED COATED 6.5IN (ELECTRODE) IMPLANT
BLADE SURG 10 STRL SS (BLADE) IMPLANT
CANISTER SUCTION 3000ML PPV (SUCTIONS) ×1 IMPLANT
CAUTERY HOOK MNPLR 1.6 DVNC XI (INSTRUMENTS) IMPLANT
CHLORAPREP W/TINT 26 (MISCELLANEOUS) ×1 IMPLANT
CLAMP SUTURE YELLOW 5 PAIRS (MISCELLANEOUS) IMPLANT
CLIP LIGATING HEM O LOK PURPLE (MISCELLANEOUS) IMPLANT
CLIP LIGATING HEMO O LOK GREEN (MISCELLANEOUS) ×1 IMPLANT
CLIP LIGATING HEMOLOK MED (MISCELLANEOUS) IMPLANT
CLIP TI LARGE 6 (CLIP) IMPLANT
CLIP TI MEDIUM 24 (CLIP) ×1 IMPLANT
CLIP TI MEDIUM 6 (CLIP) ×1 IMPLANT
CLIP TI WIDE RED SMALL 24 (CLIP) ×1 IMPLANT
CLIP TI WIDE RED SMALL 6 (CLIP) ×1 IMPLANT
CNTNR URN SCR LID CUP LEK RST (MISCELLANEOUS) IMPLANT
COVER MAYO STAND STRL (DRAPES) ×1 IMPLANT
COVER SURGICAL LIGHT HANDLE (MISCELLANEOUS) ×1 IMPLANT
COVER TIP SHEARS 8 DVNC (MISCELLANEOUS) ×1 IMPLANT
DEFOGGER SCOPE WARM SEASHARP (MISCELLANEOUS) ×1 IMPLANT
DERMABOND ADVANCED .7 DNX12 (GAUZE/BANDAGES/DRESSINGS) ×2 IMPLANT
DRAIN CHANNEL 19F RND (DRAIN) ×1 IMPLANT
DRAPE ARM DVNC X/XI (DISPOSABLE) ×4 IMPLANT
DRAPE COLUMN DVNC XI (DISPOSABLE) ×1 IMPLANT
DRAPE CV SPLIT W-CLR ANES SCRN (DRAPES) ×1 IMPLANT
DRAPE INCISE IOBAN 66X45 STRL (DRAPES) ×1 IMPLANT
DRAPE LAPAROSCOPIC ABDOMINAL (DRAPES) ×1 IMPLANT
DRAPE SURG ORHT 6 SPLT 77X108 (DRAPES) ×1 IMPLANT
DRAPE WARM FLUID 44X44 (DRAPES) ×1 IMPLANT
DRSG TEGADERM 4X4.75 (GAUZE/BANDAGES/DRESSINGS) ×2 IMPLANT
DRSG TELFA 3X8 NADH STRL (GAUZE/BANDAGES/DRESSINGS) IMPLANT
ELECT BLADE 6.5 EXT (BLADE) ×1 IMPLANT
ELECT CAUTERY BLADE 6.4 (BLADE) ×1 IMPLANT
ELECT PAD DSPR THERM+ ADLT (MISCELLANEOUS) ×1 IMPLANT
ELECTRODE BLDE 4.0 EZ CLN MEGD (MISCELLANEOUS) IMPLANT
ELECTRODE REM PT RTRN 9FT ADLT (ELECTROSURGICAL) ×1 IMPLANT
ENDOLOOP SUT PDS II 0 18 (SUTURE) IMPLANT
EVACUATOR SILICONE 100CC (DRAIN) ×1 IMPLANT
FORCEPS BPLR FENES DVNC XI (FORCEP) ×1 IMPLANT
FORCEPS PROGRASP DVNC XI (FORCEP) ×1 IMPLANT
GAUZE 4X4 16PLY ~~LOC~~+RFID DBL (SPONGE) IMPLANT
GAUZE SPONGE 4X4 12PLY STRL (GAUZE/BANDAGES/DRESSINGS) IMPLANT
GLOVE BIOGEL PI IND STRL 6 (GLOVE) ×1 IMPLANT
GLOVE BIOGEL PI MICRO STRL 5.5 (GLOVE) ×2 IMPLANT
GLOVE SURG UNDER LTX SZ6.5 (GLOVE) ×2 IMPLANT
GOWN STRL REUS W/ TWL LRG LVL3 (GOWN DISPOSABLE) ×2 IMPLANT
GOWN STRL REUS W/TWL 2XL LVL3 (GOWN DISPOSABLE) ×1 IMPLANT
GRASPER SUT TROCAR 14GX15 (MISCELLANEOUS) ×1 IMPLANT
HAND PENCIL TRP OPTION (MISCELLANEOUS) ×1 IMPLANT
HANDLE SUCTION POOLE (INSTRUMENTS) ×1 IMPLANT
HEMOSTAT HEMOBLAST BELLOWS (HEMOSTASIS) IMPLANT
HEMOSTAT SNOW SURGICEL 2X4 (HEMOSTASIS) ×1 IMPLANT
IRRIGATION SUCT STRKRFLW 2 WTP (MISCELLANEOUS) IMPLANT
IRRIGATOR SUCT 8 DISP DVNC XI (IRRIGATION / IRRIGATOR) IMPLANT
KIT BASIN OR (CUSTOM PROCEDURE TRAY) ×1 IMPLANT
KIT TURNOVER KIT B (KITS) ×1 IMPLANT
LHOOK LAP DISP 36CM (ELECTROSURGICAL) IMPLANT
LIGASURE IMPACT 36 18CM CVD LR (INSTRUMENTS) IMPLANT
NDL HYPO 22X1.5 SAFETY MO (MISCELLANEOUS) ×1 IMPLANT
NDL INSUFFLATION 14GA 120MM (NEEDLE) ×1 IMPLANT
NEEDLE HYPO 22X1.5 SAFETY MO (MISCELLANEOUS) ×1 IMPLANT
NEEDLE INSUFFLATION 14GA 120MM (NEEDLE) ×1 IMPLANT
NS IRRIG 1000ML POUR BTL (IV SOLUTION) ×2 IMPLANT
OBTURATOR OPTICALSTD 8 DVNC (TROCAR) ×1 IMPLANT
PACK GENERAL/GYN (CUSTOM PROCEDURE TRAY) ×1 IMPLANT
PAD ARMBOARD POSITIONER FOAM (MISCELLANEOUS) ×2 IMPLANT
PENCIL BUTTON HOLSTER BLD 10FT (ELECTRODE) ×1 IMPLANT
PENCIL SMOKE EVACUATOR (MISCELLANEOUS) ×1 IMPLANT
POUCH RETRIEVAL ECOSAC 10 (ENDOMECHANICALS) IMPLANT
RETRACTOR WOUND ALXS 34CM XLRG (MISCELLANEOUS) IMPLANT
SCISSORS LAP 5X35 DISP (ENDOMECHANICALS) IMPLANT
SCISSORS MNPLR CVD DVNC XI (INSTRUMENTS) ×1 IMPLANT
SEAL UNIV 5-12 XI (MISCELLANEOUS) ×4 IMPLANT
SEALER BIPOLAR AQUA 6.0 (INSTRUMENTS) ×1 IMPLANT
SET TUBE SMOKE EVAC HIGH FLOW (TUBING) ×1 IMPLANT
SPECIMEN JAR SMALL (MISCELLANEOUS) ×1 IMPLANT
SPIKE FLUID TRANSFER (MISCELLANEOUS) ×1 IMPLANT
SPONGE T-LAP 18X18 ~~LOC~~+RFID (SPONGE) ×1 IMPLANT
STOPCOCK 4 WAY LG BORE MALE ST (IV SETS) ×1 IMPLANT
SUT CHROMIC 0 BP (SUTURE) ×2 IMPLANT
SUT CHROMIC 3 0 CT 36 (SUTURE) IMPLANT
SUT ETHILON 2 0 FS 18 (SUTURE) ×1 IMPLANT
SUT MNCRL AB 4-0 PS2 18 (SUTURE) ×1 IMPLANT
SUT PDS AB 1 TP1 96 (SUTURE) ×2 IMPLANT
SUT PDS AB 2-0 CT1 27 (SUTURE) IMPLANT
SUT PDS AB 3-0 SH 27 (SUTURE) ×1 IMPLANT
SUT PDS AB 4-0 RB1 27 (SUTURE) IMPLANT
SUT PROLENE 2 0 SH DA (SUTURE) IMPLANT
SUT PROLENE 3 0 SH 48 (SUTURE) ×1 IMPLANT
SUT PROLENE 3 0 SH DA (SUTURE) IMPLANT
SUT PROLENE 4-0 RB1 .5 CRCL 36 (SUTURE) ×2 IMPLANT
SUT SILK 2 0 SH (SUTURE) ×1 IMPLANT
SUT SILK 2 0 TIES 10X30 (SUTURE) ×1 IMPLANT
SUT SILK 2 0SH CR/8 30 (SUTURE) ×1 IMPLANT
SUT SILK 3 0 TIES 10X30 (SUTURE) ×1 IMPLANT
SUT SILK 3 0SH CR/8 30 (SUTURE) IMPLANT
SUT VIC AB 3-0 SH 27X BRD (SUTURE) ×1 IMPLANT
SUT VIC AB 3-0 SH 27XBRD (SUTURE) ×1 IMPLANT
SYR BULB IRRIG 60ML STRL (SYRINGE) ×1 IMPLANT
SYSTEM BAG RETRIEVAL 10MM (BASKET) IMPLANT
TOWEL GREEN STERILE (TOWEL DISPOSABLE) ×1 IMPLANT
TOWEL GREEN STERILE FF (TOWEL DISPOSABLE) ×1 IMPLANT
TRAY FOLEY MTR SLVR 14FR STAT (SET/KITS/TRAYS/PACK) ×1 IMPLANT
TRAY FOLEY W/BAG SLVR 14FR (SET/KITS/TRAYS/PACK) IMPLANT
TRAY LAPAROSCOPIC MC (CUSTOM PROCEDURE TRAY) ×1 IMPLANT
TUBE CONNECTING 12X1/4 (SUCTIONS) IMPLANT
WATER STERILE IRR 1000ML POUR (IV SOLUTION) ×1 IMPLANT

## 2023-09-21 NOTE — Transfer of Care (Signed)
 Immediate Anesthesia Transfer of Care Note  Patient: Elizabeth [name]  Procedure(s) Performed: CHOLECYSTECTOMY, ROBOT-ASSISTED, LAPAROSCOPIC  Patient Location: PACU  Anesthesia Type:General  Level of Consciousness: drowsy  Airway & Oxygen Therapy: Patient Spontanous Breathing  Post-op Assessment: Report given to RN  Post vital signs: Reviewed and stable  Last Vitals:  Vitals Value Taken Time  BP 171/69 09/21/23 11:31  Temp    Pulse 96 09/21/23 11:34  Resp 26 09/21/23 11:34  SpO2 97 % 09/21/23 11:34  Vitals shown include unfiled device data.  Last Pain:  Vitals:   09/21/23 0728  TempSrc:   PainSc: 0-No pain         Complications: No notable events documented.

## 2023-09-21 NOTE — Anesthesia Postprocedure Evaluation (Signed)
 Anesthesia Post Note  Patient: Elizabeth [name]  Procedure(s) Performed: CHOLECYSTECTOMY, ROBOT-ASSISTED, LAPAROSCOPIC     Patient location during evaluation: PACU Anesthesia Type: General Level of consciousness: awake and alert Pain management: pain level controlled Vital Signs Assessment: post-procedure vital signs reviewed and stable Respiratory status: spontaneous breathing, nonlabored ventilation, respiratory function stable and patient connected to nasal cannula oxygen Cardiovascular status: blood pressure returned to baseline and stable Postop Assessment: no apparent nausea or vomiting Anesthetic complications: no   No notable events documented.  Last Vitals:  Vitals:   09/21/23 1145 09/21/23 1200  BP: (!) 172/76 (!) 148/65  Pulse: 98 84  Resp: 16 19  Temp:  36.8 C  SpO2: 97% 96%    Last Pain:  Vitals:   09/21/23 1130  TempSrc:   PainSc: Asleep                 Rome Ade

## 2023-09-21 NOTE — Op Note (Signed)
 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 She had a CT scan and US  that showed a mass in the gallbladder. This was confirmed on MRCP. CA19-9 was normal. After a discussion of the risks and benefits of surgery, she agreed to proceed with cholecystectomy, with possible radical cholecystectomy if malignancy was identified intraoperatively.  Findings: Benign calcified liver nodule. Pedunculated mass within the lumen of the gallbladder, soft and mobile. Frozen analysis did not show invasive carcinoma. Evidence of chronic cholecystitis.  Procedure details: Informed consent was obtained in the preoperative area prior to the procedure. The patient was brought to the operating room and placed on the table in the supine position. General anesthesia was induced and appropriate lines and drains were placed for intraoperative monitoring. Perioperative antibiotics were administered per SCIP guidelines. The abdomen was prepped and draped in the usual sterile fashion. A pre-procedure timeout was taken verifying patient identity, surgical site and procedure to be performed.  A small infraumbilical skin incision was made, the umbilical stalk was grasped and elevated, and a Veress needle was inserted through the fascia.  Intraperitoneal placement was confirmed with a saline drop test, the abdomen was insufflated, and an 8 mm Visiport  was placed.  The peritoneal cavity was inspected with no evidence of visceral or vascular injury.  An 8 mm port was placed in the right mid abdomen, and a 12 mm assistant port was placed in the right lateral costal margin, both under direct visualization.  Additional 8 mm ports were placed in the left mid abdomen and in the left upper quadrant under direct visualization.  The peritoneal cavity was inspected.  There was a small subcentimeter nodule on the surface of the right lobe of the liver.  There were otherwise no suspicious liver lesions, and no nodules on the peritoneal surface or the diaphragm.  A biopsy forceps was used to excise the liver nodule, which was sent to pathology for frozen analysis.  Hemostasis was achieved at the biopsy site using cautery.  The frozen section confirmed a benign calcified nodule.  The robot was docked to the patient.  The fundus of the gallbladder was grasped and retracted cephalad. The infundibulum was retracted laterally. The cystic triangle was cleared of fatty tissue and dissected out using cautery and blunt dissection.  The cystic artery was circumferentially dissected out, clipped, and divided with cautery.  Calot's node was visible adjacent to the cystic artery, and the cystic artery was divided distal to the node such that the node would be removed with the specimen.  The cystic duct was circumferentially dissected out using blunt dissection, and the lower third of the gallbladder was taken off the cystic plate using cautery.  A posterior branch of the cystic artery was clipped and divided.  The critical view of safety was obtained, and ICG was used to confirm the biliary anatomy. The cystic duct was clipped and sharply divided, leaving two clips behind on the cystic duct stump. The gallbladder was taken off the liver using cautery.  The tissue planes  between the gallbladder and the cystic plate were well-preserved with no evidence of any tumor invasion into the liver.   The specimen was placed in an Ecosac and extracted via the assist port in the right upper quadrant.  Hemostasis was achieved in the gallbladder fossa using cautery.  The specimen was then examined on the back table.  The cystic duct margin was sharply excised and sent as a separate specimen. The body of the gallbladder was then opened, and there was a large polyp within the lumen.  The polyp was very soft and pedunculated, more consistent with a benign lesion.  A sample of the polyp was sharply excised and sent for frozen section.  While awaiting the frozen analysis, the porta hepatis was examined.  The pars flaccida was opened with cautery and the station 8 lymph node was visualized.  The node was gently grasped and elevated and carefully dissected off the hepatic artery using gentle blunt dissection.  The hilum of the node was clipped and divided.  The node was further gently dissected off the hepatic artery and excised.  Additional fatty tissue was taken off the anterior surface of the common bile duct.  This was sent with the station 8 lymph node to pathology as portal lymph nodes.  At this point the frozen analysis of the gallbladder polyp resulted.  On discussion with the pathologist, there was no evidence of invasive malignancy and it was felt that this was most likely a benign adenomatous polyp.  As the polyp felt very soft and was mobile and pedunculated, with no invasive malignancy on frozen section, and the patient had a normal CA 19-9, I did not feel that a radical cholecystectomy should be performed at this time. The surgical site was irrigated with saline and appeared hemostatic.  The lymphadenectomy site appeared hemostatic, however a small piece of Surgicel snow was placed to ensure continued hemostasis. The cystic duct and artery stumps were visually inspected and there was no evidence of bile leak or bleeding.  The robot was undocked from the patient.  The 12 mm port site was closed with 0 Vicryl  sutures using a PMI.  The remaining ports were removed and the abdomen was desufflated. The skin at all port sites was closed with 4-0 monocryl subcuticular suture. Dermabond was applied.  The patient tolerated the procedure well with no apparent complications. All counts were correct x2 at the end of the procedure. The patient was extubated and taken to PACU in stable condition.  Leonor Dawn, MD 09/21/23 11:18 AM

## 2023-09-21 NOTE — Anesthesia Procedure Notes (Signed)
 Procedure Name: Intubation Date/Time: 09/21/2023 9:17 AM  Performed by: Delores Duwaine SAUNDERS, CRNAPre-anesthesia Checklist: Patient identified, Emergency Drugs available, Suction available and Patient being monitored Patient Re-evaluated:Patient Re-evaluated prior to induction Oxygen Delivery Method: Circle System Utilized Preoxygenation: Pre-oxygenation with 100% oxygen Induction Type: IV induction Ventilation: Mask ventilation without difficulty Laryngoscope Size: Mac and 3 Grade View: Grade I Tube type: Oral Tube size: 6.5 mm Number of attempts: 1 Airway Equipment and Method: Stylet and Oral airway Placement Confirmation: ETT inserted through vocal cords under direct vision, positive ETCO2 and breath sounds checked- equal and bilateral Secured at: 21 cm Tube secured with: Tape Dental Injury: Teeth and Oropharynx as per pre-operative assessment

## 2023-09-21 NOTE — Discharge Instructions (Addendum)
 [facility] DISCHARGE INSTRUCTIONS  Activity No heavy lifting greater than 15 pounds for 4 weeks after surgery. Ok to shower in 24 hours, but do not bathe or submerge incisions underwater. Do not drive while taking narcotic pain medication. You may drive when you are no longer taking prescription pain medication, you can comfortably wear a seatbelt, and you can safely maneuver your car and apply brakes.  Wound Care Your incisions are covered with skin glue called Dermabond. This will peel off on its own over time. You may shower and allow warm soapy water  to run over your incisions. Gently pat dry. Do not submerge your incision underwater until cleared by your surgeon. Monitor your incision for any new redness, tenderness, or drainage. Many patients will experience some swelling and bruising at the incisions.  Ice packs will help.  Swelling and bruising can take several days to resolve.   Medications A  prescription for pain medication may be given to you upon discharge.  Take your pain medication as prescribed, if needed.  If narcotic pain medicine is not needed, then you may take acetaminophen  (Tylenol ) or ibuprofen  (Advil ) as needed. It is common to experience some constipation if taking pain medication after surgery.  Increasing fluid intake and taking a stool softener (such as Colace) will usually help or prevent this problem from occurring.  A mild laxative (Milk of Magnesia or Miralax ) should be taken according to package directions if there are no bowel movements after 48 hours. Take your usually prescribed medications unless otherwise directed. If you need a refill on your pain medication, please contact your pharmacy.  They will contact our office to request authorization. Prescriptions will not be filled after 5 pm or on weekends.  When to Call Us : Fever greater than 100.5 New redness, drainage, or swelling at incision site Severe pain, nausea, or  vomiting Persistent bleeding from incisions Jaundice (yellowing of the whites of the eyes or skin)  Follow-up You have an appointment scheduled with Dr. Dasie on October 15, 2023 at 11:10am. This will be at the [facility] office at [address], [address], [place], [place]. Please arrive at least 15 minutes prior to your scheduled appointment time.  IF YOU HAVE DISABILITY OR FAMILY LEAVE FORMS, YOU MUST BRING THEM TO THE OFFICE FOR PROCESSING.   DO NOT GIVE THEM TO YOUR DOCTOR.  The clinic staff is available to answer your questions during regular business hours.  Please don't hesitate to call and ask to speak to one of the nurses for clinical concerns.  If you have a medical emergency, go to the nearest emergency room or call 911.  A surgeon from [facility] is always on call at the hospital  [address], [address], [place], [place]  [address] ?  [address] [address], [place], [place]   [address] [phone] ? Toll Free: [phone] ? FAX [phone] Web site: [email or web address]      Managing Your Pain After Surgery Without Opioids    Thank you for participating in our program to help patients manage their pain after surgery without opioids. This is part of our effort to provide you with the best care possible, without exposing you or your family to the risk that opioids pose.  What pain can I expect after surgery? You can expect to have some pain after surgery. This is normal. The pain is typically worse the day after surgery, and quickly begins to get better. Many studies have  found that many patients are able to manage their pain after surgery with Over-the-Counter (OTC) medications such as Tylenol  and Motrin . If you have a condition that does not allow you to take Tylenol  or Motrin , notify your surgical team.  How will I manage my pain? The best strategy for controlling your pain after surgery is around the clock pain control with  Tylenol  (acetaminophen ) and Motrin  (ibuprofen  or Advil ). Alternating these medications with each other allows you to maximize your pain control. In addition to Tylenol  and Motrin , you can use heating pads or ice packs on your incisions to help reduce your pain.  How will I alternate your regular strength over-the-counter pain medication? You will take a dose of pain medication every three hours. Start by taking 650 mg of Tylenol  (2 pills of 325 mg) 3 hours later take 600 mg of Motrin  (3 pills of 200 mg) 3 hours after taking the Motrin  take 650 mg of Tylenol  3 hours after that take 600 mg of Motrin .   - 1 -  See example - if your first dose of Tylenol  is at 12:00 PM   12:00 PM Tylenol  650 mg (2 pills of 325 mg)  3:00 PM Motrin  600 mg (3 pills of 200 mg)  6:00 PM Tylenol  650 mg (2 pills of 325 mg)  9:00 PM Motrin  600 mg (3 pills of 200 mg)  Continue alternating every 3 hours   We recommend that you follow this schedule around-the-clock for at least 3 days after surgery, or until you feel that it is no longer needed. Use the table on the last page of this handout to keep track of the medications you are taking. Important: Do not take more than 3000mg  of Tylenol  or 3200mg  of Motrin  in a 24-hour period. Do not take ibuprofen /Motrin  if you have a history of bleeding stomach ulcers, severe kidney disease, &/or actively taking a blood thinner  What if I still have pain? If you have pain that is not controlled with the over-the-counter pain medications (Tylenol  and Motrin  or Advil ) you might have what we call "breakthrough" pain. You will receive a prescription for a small amount of an opioid pain medication such as Oxycodone, Tramadol , or Tylenol  with Codeine. Use these opioid pills in the first 24 hours after surgery if you have breakthrough pain. Do not take more than 1 pill every 4-6 hours.  If you still have uncontrolled pain after using all opioid pills, don't hesitate to call our staff  using the number provided. We will help make sure you are managing your pain in the best way possible, and if necessary, we can provide a prescription for additional pain medication.   Day 1    Time  Name of Medication Number of pills taken  Amount of Acetaminophen   Pain Level   Comments  AM PM       AM PM       AM PM       AM PM       AM PM       AM PM       AM PM       AM PM       Total Daily amount of Acetaminophen  Do not take more than  3,000 mg per day      Day 2    Time  Name of Medication Number of pills taken  Amount of Acetaminophen   Pain Level   Comments  AM PM  AM PM       AM PM       AM PM       AM PM       AM PM       AM PM       AM PM       Total Daily amount of Acetaminophen  Do not take more than  3,000 mg per day      Day 3    Time  Name of Medication Number of pills taken  Amount of Acetaminophen   Pain Level   Comments  AM PM       AM PM       AM PM       AM PM         AM PM       AM PM       AM PM       AM PM       Total Daily amount of Acetaminophen  Do not take more than  3,000 mg per day      Day 4    Time  Name of Medication Number of pills taken  Amount of Acetaminophen   Pain Level   Comments  AM PM       AM PM       AM PM       AM PM       AM PM       AM PM       AM PM       AM PM       Total Daily amount of Acetaminophen  Do not take more than  3,000 mg per day      Day 5    Time  Name of Medication Number of pills taken  Amount of Acetaminophen   Pain Level   Comments  AM PM       AM PM       AM PM       AM PM       AM PM       AM PM       AM PM       AM PM       Total Daily amount of Acetaminophen  Do not take more than  3,000 mg per day      Day 6    Time  Name of Medication Number of pills taken  Amount of Acetaminophen   Pain Level  Comments  AM PM       AM PM       AM PM       AM PM       AM PM       AM PM       AM PM       AM PM       Total Daily amount of  Acetaminophen  Do not take more than  3,000 mg per day      Day 7    Time  Name of Medication Number of pills taken  Amount of Acetaminophen   Pain Level   Comments  AM PM       AM PM       AM PM       AM PM       AM PM       AM PM       AM PM       AM PM  Total Daily amount of Acetaminophen  Do not take more than  3,000 mg per day        For additional information about how and where to safely dispose of unused opioid medications - [email or web address]  Disclaimer: This document contains information and/or instructional materials adapted from [facility] for the typical patient with your condition. It does not replace medical advice from your health care provider because your experience may differ from that of the typical patient. Talk to your health care provider if you have any questions about this document, your condition or your treatment plan. Adapted from [facility]

## 2023-09-21 NOTE — H&P (Signed)
 Elizabeth [name] is an 78 y.o. female.   Chief Complaint: gallbladder mass HPI: Elizabeth [name] is a 78 y.o. female who was referred with a gallbladder mass.  She was admitted at [facility] last month after being found down at home, and was found to have rhabdomyolysis (thought to be from dehydration and being unresponsive) and group B Strep bacteremia. She says she has been having abdominal pain in the center of the abdomen after eating, as well as nausea. She had a CT scan of her abdomen during her workup in the hospital, which showed a mass-like lesion in the gallbladder. She then had an US  and MRCP, which showed an enhancing 3.5cm mass within the lumen of the gallbladder. CA19-9 was normal at 5. She was referred to me at discharge for evaluation.  She has a history of chronic diastolic heart failure and moderate pulmonary hypertension and follows with Dr. Ladona, who she last saw in September 2023. Her echo at that time showed an LVEF of 60-65%, with mild mitral regurgitation and RVSP of . She reports frequent dyspnea on exertion, and does not think she would be able to climb a flight of stairs without fatiguing. She had a preop cardiology evaluation and was stratified as low risk for a major cardiac event (0.9%). She is here today for surgery.   Past Medical History:  Diagnosis Date   Anxiety    Arthritis    right knee   Breast CA (HCC)    (Lt) breast ca dx 02/2010   Breast cancer (HCC) 03/31/2011   Carotid artery disease (HCC)    Depression    Diastolic HF (heart failure) (HCC)    Dyspnea    GERD (gastroesophageal reflux disease) 12/17/2012   Hypertension    Hypertension 12/17/2012   Nausea alone 12/17/2012   Schizophrenia (HCC)     Past Surgical History:  Procedure Laterality Date   BREAST SURGERY     HYSTEROTOMY  12/2018   MASTECTOMY Left    ROBOTIC ASSISTED TOTAL HYSTERECTOMY WITH BILATERAL SALPINGO OOPHERECTOMY Bilateral 12/17/2018   Procedure: XI ROBOTIC ASSISTED TOTAL  HYSTERECTOMY WITH BILATERAL SALPINGO OOPHORECTOMY;  Surgeon: Eloy Herring, MD;  Location: [facility];  Service: Gynecology;  Laterality: Bilateral;   SENTINEL NODE BIOPSY N/A 12/17/2018   Procedure: SENTINEL NODE BIOPSY;  Surgeon: Eloy Herring, MD;  Location: [facility];  Service: Gynecology;  Laterality: N/A;   TONSILLECTOMY      Family History  Problem Relation Age of Onset   Lung cancer Mother    Lung cancer Father    Heart disease Sister    Breast cancer Sister    Cancer Sister    Heart disease Sister    Heart attack Brother    Social History:  reports that she has never smoked. She has never used smokeless tobacco. She reports that she does not drink alcohol and does not use drugs.  Allergies:  Allergies  Allergen Reactions   Amlodipine      Severe edema, SOB, weight gain, rash   Ciprofloxacin Itching   Ofloxacin Swelling   Sulfamethoxazole-Trimethoprim Swelling   Benztropine Rash    Patients daughter stated this medication gave the patient a rash the last time she took it.    Medications Prior to Admission  Medication Sig Dispense Refill   ARIPiprazole (ABILIFY) 15 MG tablet Take 15 mg by mouth at bedtime.      bismuth subsalicylate (PEPTO BISMOL) 262 MG/15ML suspension Take 30 mLs by mouth every 6 (six) hours as needed for  indigestion.     carboxymethylcellulose (REFRESH PLUS) 0.5 % SOLN Place 1 drop into both eyes 3 (three) times daily as needed (dry/irritated eyes).     Cholecalciferol (VITAMIN D) 125 MCG (5000 UT) CAPS Take 5,000 Units by mouth daily.     clotrimazole-betamethasone (LOTRISONE) cream Apply 1 Application topically 2 (two) times daily as needed (itching).     escitalopram (LEXAPRO) 20 MG tablet Take 20 mg by mouth at bedtime.      fluticasone (FLONASE) 50 MCG/ACT nasal spray Place 1 spray into both nostrils as needed for allergies.     furosemide  (LASIX ) 20 MG tablet Take 20 mg by mouth daily.     lisinopril  (ZESTRIL ) 10 MG tablet Take 10 mg by mouth daily.      methylphenidate (RITALIN) 10 MG tablet Take 10 mg by mouth in the morning.     rosuvastatin (CRESTOR) 20 MG tablet Take 20 mg by mouth daily.     spironolactone (ALDACTONE) 25 MG tablet Take 25 mg by mouth 2 (two) times daily.     vitamin B-12 (CYANOCOBALAMIN) 1000 MCG tablet Take 1,000 mcg by mouth daily.     acetaminophen  (TYLENOL ) 500 MG tablet Take 1,000 mg by mouth every 6 (six) hours as needed for mild pain (pain score 1-3) or moderate pain (pain score 4-6).     VENTOLIN HFA 108 (90 Base) MCG/ACT inhaler Inhale 2 puffs into the lungs every 6 (six) hours as needed for wheezing or shortness of breath.  (Patient not taking: Reported on 09/19/2023)      Results for orders placed or performed during the hospital encounter of 09/21/23 (from the past 48 hours)  Prepare RBC (crossmatch)     Status: None   Collection Time: 09/21/23  7:04 AM  Result Value Ref Range   Order Confirmation      ORDER PROCESSED BY BLOOD BANK Performed at [facility], [address], [place], [place] [address]    No results found.  Review of Systems  Blood pressure (!) 153/54, pulse 66, resp. rate 16, height 5' 1 (1.549 m), weight 99.2 kg, SpO2 99%. Physical Exam Vitals reviewed.  Constitutional:      General: She is not in acute distress.    Appearance: Normal appearance.  Pulmonary:     Effort: Pulmonary effort is normal. No respiratory distress.  Abdominal:     General: There is no distension.     Palpations: Abdomen is soft.     Tenderness: There is no abdominal tenderness.  Skin:    General: Skin is warm and dry.  Neurological:     General: No focal deficit present.     Mental Status: She is alert and oriented to person, place, and time.      Assessment/Plan 78 yo female with an incidental gallbladder mass. Proceed to the OR for robotic-assisted cholecystectomy. If there is suspicion for an invasive cancer intra-op, will proceed with a radical cholecystectomy. Discussed with the patient  that this would likely require conversion to a laparotomy. She expressed understanding and all questions were answered.  Leonor LITTIE Dawn, MD 09/21/2023, 8:41 AM

## 2023-09-24 LAB — SURGICAL PATHOLOGY

## 2023-09-24 NOTE — Transitions of Care (Post Inpatient/ED Visit) (Signed)
 09/24/2023  Patient ID: Elizabeth [name], female   DOB: 17-Jul-1945, 78 y.o.   MRN: [id number]  Patient admitted after scheduled procedure.  TOC program closed.    Grayson Pfefferle J. Keyvon Herter RN, MSN [facility], [facility] RN Care Manager Direct Dial: [phone]  Fax: [phone] Website: [email or web address]

## 2023-09-25 ENCOUNTER — Telehealth: Payer: Self-pay

## 2023-09-25 DIAGNOSIS — I10 Essential (primary) hypertension: Secondary | ICD-10-CM

## 2023-09-25 DIAGNOSIS — K828 Other specified diseases of gallbladder: Secondary | ICD-10-CM

## 2023-09-25 LAB — TYPE AND SCREEN
ABO/RH(D): A POS
Antibody Screen: NEGATIVE
Unit division: 0
Unit division: 0

## 2023-09-25 LAB — BPAM RBC
Blood Product Expiration Date: 202508112359
Blood Product Expiration Date: 202508112359
Unit Type and Rh: 6200
Unit Type and Rh: 6200

## 2023-09-25 NOTE — Patient Instructions (Signed)
 Visit Information  Thank you for taking time to visit with me today. Please don't hesitate to contact me if I can be of assistance to you.   Call MD for: persistant nausea and vomiting Call MD for: redness, tenderness, or signs of infection (pain, swelling, redness, odor or green/yellow discharge around incision site) Call MD for: severe uncontrolled pain Call MD for: temperature >100.4 Diet general Increase activity slowly Lifting restrictions 15 pounds for 4 weeks   Patient verbalizes understanding of instructions and care plan provided today and agrees to view in MyChart. Active MyChart status and patient understanding of how to access instructions and care plan via MyChart confirmed with patient.     The patient has been provided with contact information for the care management team and has been advised to call with any health related questions or concerns.   Please call the care guide team at [phone] if you need to cancel or reschedule your appointment.   Please call the Suicide and Crisis Lifeline: 988 if you are experiencing a Mental Health or Behavioral Health Crisis or need someone to talk to.  Shereese Bonnie J. Teneka Malmberg RN, MSN [facility], [facility] Health RN Care Manager Direct Dial: [phone]  Fax: [phone] Website: [email or web address]

## 2023-09-25 NOTE — Transitions of Care (Post Inpatient/ED Visit) (Signed)
 Transition of Care Final follow up  Visit Note  09/25/2023  Name: Elizabeth [name] MRN: [id number]          DOB: 27-Mar-1945  Situation: Patient enrolled in [facility] 30-day program. Visit completed with daughter [name] [facility]) by telephone.  She report patient came through surgery well. Patient having some pain but it is better. She is managing with tylenol .  Follow up with surgeon scheduled for 8/11 per daughter.  Reviewed symptoms to watch for.  She verbalized understanding.  Advised on longitudinal nurse program for continued support.  Daughter is agreeable.  Referral to CCM completes.    Background:   Initial Transition Care Management Follow-up Telephone Call    Past Medical History:  Diagnosis Date   Anxiety    Arthritis    right knee   Breast CA (HCC)    (Lt) breast ca dx 02/2010   Breast cancer (HCC) 03/31/2011   Carotid artery disease (HCC)    Depression    Diastolic HF (heart failure) (HCC)    Dyspnea    GERD (gastroesophageal reflux disease) 12/17/2012   Hypertension    Hypertension 12/17/2012   Nausea alone 12/17/2012   Schizophrenia (HCC)     Assessment: Patient Reported Symptoms: Cognitive Cognitive Status: Alert and oriented to person, place, and time      Neurological Neurological Review of Symptoms: No symptoms reported    HEENT HEENT Symptoms Reported: No symptoms reported      Cardiovascular Cardiovascular Symptoms Reported: No symptoms reported    Respiratory Respiratory Symptoms Reported: No symptoms reported    Endocrine Endocrine Symptoms Reported: No symptoms reported    Gastrointestinal Gastrointestinal Symptoms Reported: No symptoms reported      Genitourinary Genitourinary Symptoms Reported: No symptoms reported    Integumentary Integumentary Symptoms Reported: Incision Additional Integumentary Details: Abdominal incision- no signs of redness or drainage.  Encouraged daughter to monitor for any signs of infection and notify physician. Skin  Self-Management Outcome: 4 (good)  Musculoskeletal Musculoskelatal Symptoms Reviewed: Difficulty walking, Unsteady gait Additional Musculoskeletal Details: Uses cane for ambulation        Psychosocial           There were no vitals filed for this visit.  Medications Reviewed Today     Reviewed by Zymier Rodgers, RN (Case Manager) on 09/25/23 at 1204  Med List Status: <None>   Medication Order Taking? Sig Documenting Provider Last Dose Status Informant  acetaminophen  (TYLENOL ) 500 MG tablet [id number] Yes Take 1,000 mg by mouth every 6 (six) hours as needed for mild pain (pain score 1-3) or moderate pain (pain score 4-6). [provider]  Active Self  ARIPiprazole (ABILIFY) 15 MG tablet [id number] Yes Take 15 mg by mouth at bedtime.  [provider]  Active Self  bismuth subsalicylate (PEPTO BISMOL) 262 MG/15ML suspension [id number] Yes Take 30 mLs by mouth every 6 (six) hours as needed for indigestion. [provider]  Active Self  carboxymethylcellulose (REFRESH PLUS) 0.5 % SOLN [id number] Yes Place 1 drop into both eyes 3 (three) times daily as needed (dry/irritated eyes). [provider]  Active Self  Cholecalciferol (VITAMIN D) 125 MCG (5000 UT) CAPS [id number] Yes Take 5,000 Units by mouth daily. [provider]  Active Self  clotrimazole-betamethasone (LOTRISONE) cream [id number] Yes Apply 1 Application topically 2 (two) times daily as needed (itching). [provider]  Active Self  escitalopram (LEXAPRO) 20 MG tablet [id number] Yes Take 20 mg by mouth at bedtime.  [provider]  Active Self  fluticasone (FLONASE) 50 MCG/ACT nasal spray [id number] Yes Place 1 spray into both nostrils as needed for allergies. [provider]  Active Self  furosemide  (LASIX ) 20 MG tablet [id number] Yes Take 20 mg by mouth daily. [provider]  Active Self  lisinopril  (ZESTRIL ) 10 MG tablet [id number] Yes Take 10 mg by mouth  daily. [provider]  Active Self  methylphenidate (RITALIN) 10 MG tablet [id number] Yes Take 10 mg by mouth in the morning. [provider]  Active Self  oxyCODONE  (OXY IR/ROXICODONE ) 5 MG immediate release tablet [id number] Yes Take 1 tablet (5 mg total) by mouth every 6 (six) hours as needed for up to 5 days for severe pain (pain score 7-10). Dasie Leonor CROME, MD  Active   rosuvastatin (CRESTOR) 20 MG tablet [id number] Yes Take 20 mg by mouth daily. [provider]  Active Self  spironolactone (ALDACTONE) 25 MG tablet [id number] Yes Take 25 mg by mouth 2 (two) times daily. [provider]  Active Self  VENTOLIN HFA 108 (90 Base) MCG/ACT inhaler [id number]  Inhale 2 puffs into the lungs every 6 (six) hours as needed for wheezing or shortness of breath.   Patient not taking: Reported on 09/25/2023   [provider]  Active Self           Med Note [name]   Dju Aug 11, 2023  5:15 PM) Patient in need of a refill.  vitamin B-12 (CYANOCOBALAMIN) 1000 MCG tablet [id number] Yes Take 1,000 mcg by mouth daily. [provider]  Active Self            Recommendation:   Referral to: CCM nurse  Follow Up Plan:   Referral to RN Case Manager Closing From:  Transitions of Care Program   Camree Wigington DOROTHA Seeds RN, MSN [place]  [facility], [facility] RN Care Manager Direct Dial: [phone]  Fax: [phone] Website: [email or web address]

## 2023-09-26 ENCOUNTER — Telehealth: Payer: Self-pay | Admitting: *Deleted

## 2023-09-26 NOTE — Progress Notes (Signed)
 Complex Care Management Note  Care Guide Note 09/26/2023 Name: Elizabeth [name] MRN: [id number] DOB: May 11, 1945  Elizabeth [name] is a 78 y.o. year old female who sees Osei-Bonsu, Zachary, MD for primary care. I reached out to Elizabeth [name] by phone today to offer complex care management services.  Elizabeth [name] was given information about Complex Care Management services today including:   The Complex Care Management services include support from the care team which includes your Nurse Care Manager, Clinical Social Worker, or Pharmacist.  The Complex Care Management team is here to help remove barriers to the health concerns and goals most important to you. Complex Care Management services are voluntary, and the patient may decline or stop services at any time by request to their care team member.   Complex Care Management Consent Status: Patient agreed to services and verbal consent obtained.   Follow up plan:  Telephone appointment with complex care management team member scheduled for:  10/02/2023  Encounter Outcome:  Patient Scheduled  Thedford Franks, CMA [place]  [facility], [facility] Guide Direct Dial: [phone]  Fax: [phone] Website: [email or web address]

## 2023-10-02 ENCOUNTER — Other Ambulatory Visit: Payer: Self-pay | Admitting: *Deleted

## 2023-10-02 NOTE — Patient Outreach (Signed)
 Complex Care Management   Visit Note  10/02/2023  Name:  Elizabeth [name] MRN: [id number] DOB: [birth year]  Situation: Referral received for Complex Care Management related to Heart Failure/HTN I obtained verbal consent from Elizabeth [name].  Visit completed with Elizabeth Elizabeth [name]  on the phone  Background:   Past Medical History:  Diagnosis Date   Anxiety    Arthritis    right knee   Breast CA (HCC)    (Lt) breast ca dx 02/2010   Breast cancer (HCC) 03/31/2011   Carotid artery disease (HCC)    Depression    Diastolic HF (heart failure) (HCC)    Dyspnea    GERD (gastroesophageal reflux disease) 12/17/2012   Hypertension    Hypertension 12/17/2012   Nausea alone 12/17/2012   Schizophrenia (HCC)     Assessment: Patient Reported Symptoms:  Cognitive Cognitive Status: Alert and oriented to person, place, and time, Able to follow simple commands, Normal speech and language skills Cognitive/Intellectual Conditions Management [RPT]: None reported or documented in medical history or problem list   Health Maintenance Behaviors: Annual physical exam Healing Pattern: Average Health Facilitated by: Pain control  Neurological Neurological Review of Symptoms: No symptoms reported    HEENT HEENT Symptoms Reported: No symptoms reported      Cardiovascular Cardiovascular Symptoms Reported: No symptoms reported Does patient have uncontrolled Hypertension?: Yes Is patient checking Blood Pressure at home?: No (Elizabeth takes on occassions at least once weekly) Patient's Recent BP reading at home: Last read when hospitalized 148/65 on 09/21/2023 Cardiovascular Management Strategies: Medication therapy, Routine screening Weight: 218 lb (98.9 kg) Cardiovascular Self-Management Outcome: 4 (good)  Respiratory Respiratory Symptoms Reported: No symptoms reported Respiratory Management Strategies: Routine screening  Endocrine Endocrine Symptoms Reported: No symptoms reported Is patient  diabetic?: No    Gastrointestinal Gastrointestinal Symptoms Reported: Constipation (Elizabeth aware pt needs to  increase her water  intake and mobility with use of the OTC laxative.) Gastrointestinal Management Strategies: Coping strategies, Medication therapy    Genitourinary Genitourinary Symptoms Reported: No symptoms reported    Integumentary Integumentary Symptoms Reported: Incision Additional Integumentary Details: Abdominal incisional site post op surgery on 09/21/2023. Dasughter reports site is healing with no symptoms or signs of infection. Aware to notify the provider if presented. Skin Self-Management Outcome: 4 (good) Skin Comment: Dry skin aware to continue using lubricate to dry areas of the patients body.  Musculoskeletal Musculoskelatal Symptoms Reviewed: Joint pain, Difficulty walking (Knee pain with some relief from Tylenol  OTC) Additional Musculoskeletal Details: Uses cane when ambulating Musculoskeletal Management Strategies: Medication therapy, Routine screening Falls in the past year?: Yes Number of falls in past year: 1 or less Was there an injury with Fall?: No Fall Risk Category Calculator: 1 Patient Fall Risk Level: Low Fall Risk Patient at Risk for Falls Due to: Other (Comment) (Elizabeth report mass on gallbladder resulted in weakness.) Fall risk Follow up: Education provided  Psychosocial Psychosocial Symptoms Reported: Nightmares (Elizabeth will inquire with patient on late night eating and will seek OTC sleep aides if needed) Behavioral Management Strategies: Support group, Coping strategies   Quality of Family Relationships: helpful, involved, supportive Do you feel physically threatened by others?: No      10/02/2023   10:55 AM  Depression screen PHQ 2/9  Decreased Interest 0  Down, Depressed, Hopeless 0  PHQ - 2 Score 0    There were no vitals filed for this visit.  Medications Reviewed Today     Reviewed by Alvia Olam BIRCH, RN (Registered Nurse)  on 10/02/23 at 1017  Med List Status: <None>   Medication Order Taking? Sig Documenting Provider Last Dose Status Informant  acetaminophen  (TYLENOL ) 500 MG tablet [id number] Yes Take 1,000 mg by mouth every 6 (six) hours as needed for mild pain (pain score 1-3) or moderate pain (pain score 4-6). [provider]  Active Self  ARIPiprazole (ABILIFY) 15 MG tablet [id number] Yes Take 15 mg by mouth at bedtime.  [provider]  Active Self  bismuth subsalicylate (PEPTO BISMOL) 262 MG/15ML suspension [id number] Yes Take 30 mLs by mouth every 6 (six) hours as needed for indigestion. [provider]  Active Self  carboxymethylcellulose (REFRESH PLUS) 0.5 % SOLN [id number] Yes Place 1 drop into both eyes 3 (three) times daily as needed (dry/irritated eyes). [provider]  Active Self  Cholecalciferol (VITAMIN D) 125 MCG (5000 UT) CAPS [id number] Yes Take 5,000 Units by mouth daily. [provider]  Active Self  clotrimazole-betamethasone (LOTRISONE) cream [id number] Yes Apply 1 Application topically 2 (two) times daily as needed (itching). [provider]  Active Self  escitalopram (LEXAPRO) 20 MG tablet [id number] Yes Take 20 mg by mouth at bedtime.  [provider]  Active Self  fluticasone (FLONASE) 50 MCG/ACT nasal spray [id number] Yes Place 1 spray into both nostrils as needed for allergies. [provider]  Active Self  furosemide  (LASIX ) 20 MG tablet [id number] Yes Take 20 mg by mouth daily. [provider]  Active Self  lisinopril  (ZESTRIL ) 10 MG tablet [id number] Yes Take 10 mg by mouth daily. [provider]  Active Self  methylphenidate (RITALIN) 10 MG tablet [id number] Yes Take 10 mg by mouth in the morning. [provider]  Active Self  rosuvastatin (CRESTOR) 20 MG tablet [id number] Yes Take 20 mg by mouth daily. [provider]  Active Self  spironolactone (ALDACTONE) 25 MG tablet [id number] Yes Take  25 mg by mouth 2 (two) times daily. [provider]  Active Self  VENTOLIN HFA 108 (90 Base) MCG/ACT inhaler [id number]  Inhale 2 puffs into the lungs every 6 (six) hours as needed for wheezing or shortness of breath.   Patient not taking: Reported on 10/02/2023   [provider]  Active Self           Med Note [name]   Sat Aug 11, 2023  5:15 PM) Patient in need of a refill.  vitamin B-12 (CYANOCOBALAMIN) 1000 MCG tablet [id number] Yes Take 1,000 mcg by mouth daily. [provider]  Active Self            Recommendation:   PCP Follow-up 10/22/2023 @ 10:45 am Specialty provider follow-up 10/15/2023 @ 9:00 am with the surgeon Continue Current Plan of Care  Follow Up Plan:   Telephone follow up appointment date/time:  2-4 weeks  Olam Ku, RN, BSN [place]  [facility], [facility] RN Care Manager Direct Dial: [phone]  Fax: [phone]

## 2023-10-02 NOTE — Patient Instructions (Signed)
 Visit Information  Thank you for taking time to visit with me today. Please don't hesitate to contact me if I can be of assistance to you before our next scheduled appointment.  Our next appointment is by telephone on 10/16/2023 at 10:00 am Please call the care guide team at [phone] if you need to cancel or reschedule your appointment.   Following is a copy of your care plan:   Goals Addressed             This Visit's Progress    VBCI RN Care Plan-CHF       Problems:  Chronic Disease Management support and education needs related to CHF Knowledge Deficits related to CHF  Goal: Over the next 90 days the daughter [name] will continue to work with Medical illustrator and/or Social Worker to address care management and care coordination needs related to CHF as evidenced by adherence to care management team scheduled appointments     take all medications exactly as prescribed and will call provider for medication related questions as evidenced by chart review and reporting from daughter [name] (primary caregiver)    verbalize basic understanding of CHF disease process and self health management plan as evidenced by chart review and reporting from daughter [name] (primary caregiver)  Interventions:   Heart Failure Interventions: Basic overview and discussion of pathophysiology of Heart Failure reviewed Provided education on low sodium diet Reviewed Heart Failure Action Plan in depth and provided written copy Assessed need for readable accurate scales in home Advised patient to weigh each morning after emptying bladder Discussed importance of daily weight and advised patient to weigh and record daily Reviewed role of diuretics in prevention of fluid overload and management of heart failure; Discussed the importance of keeping all appointments with provider Provided patient with education about the role of exercise in the management of heart failure Screening for signs and symptoms of  depression related to chronic disease state  Assessed social determinant of health barriers   Patient Self-Care Activities:  Attend all scheduled provider appointments Call provider office for new concerns or questions  Take medications as prescribed   call office if I gain more than 2 pounds in one day or 5 pounds in one week do ankle pumps when sitting keep legs up while sitting track weight in diary use salt in moderation watch for swelling in feet, ankles and legs every day weigh myself daily bring diary to all appointments develop a rescue plan follow rescue plan if symptoms flare-up eat more whole grains, fruits and vegetables, lean meats and healthy fats know when to call the doctor:with review of signs and symptoms in the YELLOW ZONE on the HF action plan track symptoms and what helps feel better or worse  Plan:  Telephone follow up appointment with care management team member scheduled for: 10/16/2023 @ 10:00 am The patient's daughter [name] has been provided with contact information for the care management team and has been advised to call with any health related questions or concerns.           VBCI RN Care Plan-HTN       Problems:  Knowledge Deficits related to HTN  Goal: Over the next 60 days the Caregiver will continue to work with Medical illustrator and/or Social Worker to address care management and care coordination needs related to HTN as evidenced by adherence to care management team scheduled appointments     take all medications exactly as prescribed and will call provider  for medication related questions as evidenced by chart review and reporting from caregiver daughter [name]    verbalize basic understanding of HTN disease process and self health management plan as evidenced by chart review and reporting from caregiver daughter [name]  Interventions:   Hypertension Interventions: Last practice recorded BP readings:  BP Readings from Last 3 Encounters:   09/21/23 (!) 148/65  09/17/23 (!) 118/51  09/04/23 124/60   Most recent eGFR/CrCl:  Lab Results  Component Value Date   EGFR 57 (L) 03/18/2015    No components found for: CRCL  Evaluation of current treatment plan related to hypertension self management and patient's adherence to plan as established by provider Provided education to patient re: stroke prevention, s/s of heart attack and stroke Reviewed medications with patient and discussed importance of compliance Counseled on the importance of exercise goals with target of 150 minutes per week Discussed plans with patient for ongoing care management follow up and provided patient with direct contact information for care management team Advised patient, providing education and rationale, to monitor blood pressure daily and record, calling PCP for findings outside established parameters Discussed complications of poorly controlled blood pressure such as heart disease, stroke, circulatory complications, vision complications, kidney impairment, sexual dysfunction Screening for signs and symptoms of depression related to chronic disease state  Assessed social determinant of health barriers  Patient Self-Care Activities:  Attend all scheduled provider appointments Call pharmacy for medication refills 3-7 days in advance of running out of medications Call provider office for new concerns or questions  Perform all self care activities independently  Take medications as prescribed   check blood pressure weekly choose a place to take my blood pressure (home, clinic or office, retail store) write blood pressure results in a log or diary learn about high blood pressure keep a blood pressure log take blood pressure log to all doctor appointments call doctor for signs and symptoms of high blood pressure keep all doctor appointments take medications for blood pressure exactly as prescribed begin an exercise program report new symptoms to  your doctor eat more whole grains, fruits and vegetables, lean meats and healthy fats limit salt intake  Plan:  Telephone follow up appointment with care management team member scheduled for:  10/16/2023 @10 :00 am             Please call the Suicide and Crisis Lifeline: 988 call the [place]  National Suicide Prevention Lifeline: 819 852 5394 or TTY: 602-866-5203 TTY 276-299-0142) to talk to a trained counselor call 1-800-273-TALK (toll free, 24 hour hotline) if you are experiencing a Mental Health or Behavioral Health Crisis or need someone to talk to.  Patient verbalizes understanding of instructions and care plan provided today and agrees to view in MyChart. Active MyChart status and patient understanding of how to access instructions and care plan via MyChart confirmed with patient.      Olam Ku, RN, BSN [place]  [facility], [facility] RN Care Manager Direct Dial: [phone]  Fax: [phone]

## 2023-10-12 ENCOUNTER — Other Ambulatory Visit: Payer: Self-pay | Admitting: Licensed Clinical Social Worker

## 2023-10-12 NOTE — Patient Outreach (Signed)
 Complex Care Management   Visit Note  10/12/2023  Name:  Elizabeth [name] MRN: [id number] DOB: Aug 14, 1945  Situation: Referral received for Complex Care Management related to Caregiver Stress I obtained verbal consent from Caregiver.  Visit completed with daughter, [name]  on the phone  Background:   Past Medical History:  Diagnosis Date   Anxiety    Arthritis    right knee   Breast CA (HCC)    (Lt) breast ca dx 02/2010   Breast cancer (HCC) 03/31/2011   Carotid artery disease (HCC)    Depression    Diastolic HF (heart failure) (HCC)    Dyspnea    GERD (gastroesophageal reflux disease) 12/17/2012   Hypertension    Hypertension 12/17/2012   Nausea alone 12/17/2012   Schizophrenia (HCC)     Assessment: Patient Reported Symptoms:  Cognitive Cognitive Status: No symptoms reported, Alert and oriented to person, place, and time Cognitive/Intellectual Conditions Management [RPT]: None reported or documented in medical history or problem list   Health Maintenance Behaviors: Annual physical exam  Neurological Neurological Review of Symptoms: Not assessed    HEENT HEENT Symptoms Reported: Not assessed      Cardiovascular Cardiovascular Symptoms Reported: Not assessed    Respiratory Respiratory Symptoms Reported: Not assesed    Endocrine Endocrine Symptoms Reported: Not assessed    Gastrointestinal Gastrointestinal Symptoms Reported: Not assessed      Genitourinary Genitourinary Symptoms Reported: Not assessed    Integumentary Integumentary Symptoms Reported: Incision Additional Integumentary Details: Daughter reports pt continues to endorse pain/discomfort. F/up appt with Surgeon scheduled for 8/11 Skin Management Strategies: Routine screening  Musculoskeletal Musculoskelatal Symptoms Reviewed: Joint pain, Difficulty walking Musculoskeletal Management Strategies: Medication therapy, Routine screening      Psychosocial Psychosocial Symptoms Reported: Alteration in sleep  habits Additional Psychological Details: Daughter will address OTC medication, such as, Tylenol  PM to assist with sleep Behavioral Management Strategies: Coping strategies, Medication therapy, Support system Major Change/Loss/Stressor/Fears (CP): Medical condition, self        10/02/2023   10:55 AM  Depression screen PHQ 2/9  Decreased Interest 0  Down, Depressed, Hopeless 0  PHQ - 2 Score 0    There were no vitals filed for this visit.  Medications Reviewed Today     Reviewed by Beniah Magnan D, LCSW (Social Worker) on 10/12/23 at 1148  Med List Status: <None>   Medication Order Taking? Sig Documenting Provider Last Dose Status Informant  acetaminophen  (TYLENOL ) 500 MG tablet [id number]  Take 1,000 mg by mouth every 6 (six) hours as needed for mild pain (pain score 1-3) or moderate pain (pain score 4-6). [provider]  Active Self  ARIPiprazole (ABILIFY) 15 MG tablet [id number]  Take 15 mg by mouth at bedtime.  [provider]  Active Self  bismuth subsalicylate (PEPTO BISMOL) 262 MG/15ML suspension [id number]  Take 30 mLs by mouth every 6 (six) hours as needed for indigestion. [provider]  Active Self  carboxymethylcellulose (REFRESH PLUS) 0.5 % SOLN [id number]  Place 1 drop into both eyes 3 (three) times daily as needed (dry/irritated eyes). [provider]  Active Self  Cholecalciferol (VITAMIN D) 125 MCG (5000 UT) CAPS [id number]  Take 5,000 Units by mouth daily. [provider]  Active Self  clotrimazole-betamethasone (LOTRISONE) cream [id number]  Apply 1 Application topically 2 (two) times daily as needed (itching). [provider]  Active Self  escitalopram (LEXAPRO) 20 MG tablet [id number]  Take 20 mg by mouth at bedtime.  [provider]  Active Self  fluticasone (FLONASE) 50 MCG/ACT nasal spray [id number]  Place 1 spray into both nostrils as needed for allergies. [provider]  Active Self  furosemide   (LASIX ) 20 MG tablet [id number]  Take 20 mg by mouth daily. [provider]  Active Self  lisinopril  (ZESTRIL ) 10 MG tablet [id number]  Take 10 mg by mouth daily. [provider]  Active Self  methylphenidate (RITALIN) 10 MG tablet [id number]  Take 10 mg by mouth in the morning. [provider]  Active Self  rosuvastatin (CRESTOR) 20 MG tablet [id number]  Take 20 mg by mouth daily. [provider]  Active Self  spironolactone (ALDACTONE) 25 MG tablet [id number]  Take 25 mg by mouth 2 (two) times daily. [provider]  Active Self  VENTOLIN HFA 108 (90 Base) MCG/ACT inhaler [id number]  Inhale 2 puffs into the lungs every 6 (six) hours as needed for wheezing or shortness of breath.   Patient not taking: Reported on 10/02/2023   [provider]  Active Self           Med Note [name]   Sat Aug 11, 2023  5:15 PM) Patient in need of a refill.  vitamin B-12 (CYANOCOBALAMIN) 1000 MCG tablet [id number]  Take 1,000 mcg by mouth daily. [provider]  Active Self            Recommendation:   Specialty provider follow-up 8/11 Continue Current Plan of Care  Follow Up Plan:   Telephone follow-up in 1 month  Rolin Kerns, LCSW [place]  [facility], [facility] Clinical Social Worker Direct Dial: [phone]  Fax: [phone] Website: [email or web address] 11:58 AM

## 2023-10-12 NOTE — Patient Instructions (Signed)
 Visit Information  Thank you for taking time to visit with me today. Please don't hesitate to contact me if I can be of assistance to you before our next scheduled appointment.  Your next care management appointment is by telephone on 09/12 at 11 AM  Please call the care guide team at [phone] if you need to cancel, schedule, or reschedule an appointment.   Please call the Suicide and Crisis Lifeline: 988 go to [facility] Urgent [facility] [address] [place] [phone]) call 911 if you are experiencing a Mental Health or Behavioral Health Crisis or need someone to talk to.  Rolin Kerns, LCSW [place]  [facility], [facility] Clinical Social Worker Direct Dial: [phone]  Fax: [phone] Website: [email or web address] 11:59 AM

## 2023-10-16 ENCOUNTER — Other Ambulatory Visit: Payer: Self-pay | Admitting: *Deleted

## 2023-10-16 NOTE — Patient Instructions (Signed)
 Visit Information  Thank you for taking time to visit with me today. Please don't hesitate to contact me if I can be of assistance to you before our next scheduled appointment.  Your next care management appointment is by telephone on 11/20/2023 at 10:00 am  Please call the care guide team at [phone] if you need to cancel, schedule, or reschedule an appointment.   Please call the Suicide and Crisis Lifeline: 988 call the [place]  National Suicide Prevention Lifeline: 616-199-8533 or TTY: (612) 821-0461 TTY 845-744-4920) to talk to a trained counselor call 1-800-273-TALK (toll free, 24 hour hotline) if you are experiencing a Mental Health or Behavioral Health Crisis or need someone to talk to.   Olam Ku, RN, BSN [place]  [facility], [facility] RN Care Manager Direct Dial: [phone]  Fax: [phone]

## 2023-10-16 NOTE — Patient Outreach (Signed)
 Complex Care Management   Visit Note  10/16/2023  Name:  Elizabeth [name] MRN: [id number] DOB: Feb 26, 1946  Situation: Referral received for Complex Care Management related to Heart Failure and HTN I obtained verbal consent from Caregiver daughter [name].  Visit completed with Daughter [name]  on the phone  Background:   Past Medical History:  Diagnosis Date   Anxiety    Arthritis    right knee   Breast CA (HCC)    (Lt) breast ca dx 02/2010   Breast cancer (HCC) 03/31/2011   Carotid artery disease (HCC)    Depression    Diastolic HF (heart failure) (HCC)    Dyspnea    GERD (gastroesophageal reflux disease) 12/17/2012   Hypertension    Hypertension 12/17/2012   Nausea alone 12/17/2012   Schizophrenia (HCC)     Assessment: Patient Reported Symptoms:  Cognitive Cognitive Status: Alert and oriented to person, place, and time, Able to follow simple commands, Normal speech and language skills Cognitive/Intellectual Conditions Management [RPT]: None reported or documented in medical history or problem list   Health Maintenance Behaviors: Annual physical exam  Neurological Neurological Review of Symptoms: No symptoms reported    HEENT HEENT Symptoms Reported: No symptoms reported (Seasonal allergies) HEENT Management Strategies: Medication therapy, Routine screening    Cardiovascular Cardiovascular Symptoms Reported: No symptoms reported Does patient have uncontrolled Hypertension?: Yes Is patient checking Blood Pressure at home?: No (Daughter reports she takes her blood pressures once weekly) Patient's Recent BP reading at home: 10/15/2023 was 140/80 as pt remains asymptomatic Cardiovascular Management Strategies: Medication therapy, Routine screening Weight: 215 lb (97.5 kg) (Reports from a f/u at provider's office) Cardiovascular Self-Management Outcome: 4 (good)  Respiratory Respiratory Symptoms Reported: No symptoms reported Respiratory Management Strategies: Routine  screening  Endocrine Endocrine Symptoms Reported: No symptoms reported, Shakiness (Reports hands shakiness. Surgeon suggest f/u with psychologist pending in Sept to address. Safety measures discussed with ongoing education.) Is patient diabetic?: No    Gastrointestinal Gastrointestinal Symptoms Reported: No symptoms reported Gastrointestinal Management Strategies: Medication therapy    Genitourinary Genitourinary Symptoms Reported: No symptoms reported    Integumentary Integumentary Symptoms Reported: Skin changes (Daughter reports pt's has Dry skin and continue to use the prescribed medication cream however continue to be dry. Encouraged other lubricate that are fragrance free such as Hydrophor or Eucerin however clear with providerr prior to usage.) Skin Management Strategies: Medication therapy, Routine screening, Coping strategies  Musculoskeletal Musculoskelatal Symptoms Reviewed: Joint pain, Difficulty walking Additional Musculoskeletal Details: Uses cane when needed with ambulation Musculoskeletal Management Strategies: Medication therapy      Psychosocial Psychosocial Symptoms Reported: No symptoms reported Behavioral Management Strategies: Medication therapy          10/02/2023   10:55 AM  Depression screen PHQ 2/9  Decreased Interest 0  Down, Depressed, Hopeless 0  PHQ - 2 Score 0    Vitals:   10/16/23 1017  BP: (!) 140/80    Medications Reviewed Today     Reviewed by Alvia Olam BIRCH, RN (Registered Nurse) on 10/16/23 at 1011  Med List Status: <None>   Medication Order Taking? Sig Documenting Provider Last Dose Status Informant  acetaminophen  (TYLENOL ) 500 MG tablet [id number] Yes Take 1,000 mg by mouth every 6 (six) hours as needed for mild pain (pain score 1-3) or moderate pain (pain score 4-6). [provider]  Active Self  ARIPiprazole (ABILIFY) 15 MG tablet [id number] Yes Take 15 mg by mouth at bedtime.  [provider]  Active Self  bismuth  subsalicylate (PEPTO BISMOL) 262 MG/15ML suspension [id number] Yes Take 30 mLs by mouth every 6 (six) hours as needed for indigestion. [provider]  Active Self  carboxymethylcellulose (REFRESH PLUS) 0.5 % SOLN [id number] Yes Place 1 drop into both eyes 3 (three) times daily as needed (dry/irritated eyes). [provider]  Active Self  Cholecalciferol (VITAMIN D) 125 MCG (5000 UT) CAPS [id number] Yes Take 5,000 Units by mouth daily. [provider]  Active Self  clotrimazole-betamethasone (LOTRISONE) cream [id number] Yes Apply 1 Application topically 2 (two) times daily as needed (itching). [provider]  Active Self  escitalopram (LEXAPRO) 20 MG tablet [id number] Yes Take 20 mg by mouth at bedtime.  [provider]  Active Self  fluticasone (FLONASE) 50 MCG/ACT nasal spray [id number] Yes Place 1 spray into both nostrils as needed for allergies. [provider]  Active Self  furosemide (LASIX) 20 MG tablet [id number] Yes Take 20 mg by mouth daily. [provider]  Active Self  lisinopril (ZESTRIL) 10 MG tablet [id number] Yes Take 10 mg by mouth daily. [provider]  Active Self  methylphenidate (RITALIN) 10 MG tablet [id number] Yes Take 10 mg by mouth in the morning. [provider]  Active Self  rosuvastatin (CRESTOR) 20 MG tablet [id number] Yes Take 20 mg by mouth daily. [provider]  Active Self  spironolactone (ALDACTONE) 25 MG tablet [id number] Yes Take 25 mg by mouth 2 (two) times daily. [provider]  Active Self  VENTOLIN HFA 108 (90 Base) MCG/ACT inhaler [id number]  Inhale 2 puffs into the lungs every 6 (six) hours as needed for wheezing or shortness of breath.   Patient not taking: Reported on 10/02/2023   [provider]  Active Self           Med Note [name]   Sat Aug 11, 2023  5:15 PM) Patient in need of a refill.  vitamin B-12 (CYANOCOBALAMIN) 1000 MCG tablet  [id number] Yes Take 1,000 mcg by mouth daily. [provider]  Active Self            Recommendation:   PCP Follow-up Continue Current Plan of Care  Follow Up Plan:   Telephone follow up appointment date/time:  11/20/2023 @ 10:00 am   Olam Ku, RN, BSN [place]  [facility], [facility] RN Care Manager Direct Dial: [phone]  Fax: [phone]

## 2023-10-22 DIAGNOSIS — I130 Hypertensive heart and chronic kidney disease with heart failure and stage 1 through stage 4 chronic kidney disease, or unspecified chronic kidney disease: Secondary | ICD-10-CM | POA: Diagnosis not present

## 2023-10-22 DIAGNOSIS — R7303 Prediabetes: Secondary | ICD-10-CM | POA: Diagnosis not present

## 2023-10-22 DIAGNOSIS — E782 Mixed hyperlipidemia: Secondary | ICD-10-CM | POA: Diagnosis not present

## 2023-10-22 DIAGNOSIS — E559 Vitamin D deficiency, unspecified: Secondary | ICD-10-CM | POA: Diagnosis not present

## 2023-10-22 DIAGNOSIS — I5032 Chronic diastolic (congestive) heart failure: Secondary | ICD-10-CM | POA: Diagnosis not present

## 2023-10-22 DIAGNOSIS — G4733 Obstructive sleep apnea (adult) (pediatric): Secondary | ICD-10-CM | POA: Diagnosis not present

## 2023-10-22 DIAGNOSIS — L603 Nail dystrophy: Secondary | ICD-10-CM | POA: Diagnosis not present

## 2023-10-22 DIAGNOSIS — Z0001 Encounter for general adult medical examination with abnormal findings: Secondary | ICD-10-CM | POA: Diagnosis not present

## 2023-10-22 DIAGNOSIS — N1831 Chronic kidney disease, stage 3a: Secondary | ICD-10-CM | POA: Diagnosis not present

## 2023-10-22 DIAGNOSIS — J302 Other seasonal allergic rhinitis: Secondary | ICD-10-CM | POA: Diagnosis not present

## 2023-11-07 DIAGNOSIS — L603 Nail dystrophy: Secondary | ICD-10-CM | POA: Diagnosis not present

## 2023-11-07 DIAGNOSIS — M79674 Pain in right toe(s): Secondary | ICD-10-CM | POA: Diagnosis not present

## 2023-11-08 DIAGNOSIS — F251 Schizoaffective disorder, depressive type: Secondary | ICD-10-CM | POA: Diagnosis not present

## 2023-11-16 ENCOUNTER — Other Ambulatory Visit: Payer: Self-pay | Admitting: Licensed Clinical Social Worker

## 2023-11-16 NOTE — Patient Outreach (Signed)
 Complex Care Management   Visit Note  11/16/2023  Name:  Elizabeth [name] MRN: [id number] DOB: [birth year]  Situation: Referral received for Complex Care Management related to Caregiver Stress I obtained verbal consent from Caregiver.  Visit completed with Caregiver  on the phone  Background:   Past Medical History:  Diagnosis Date   Anxiety    Arthritis    right knee   Breast CA (HCC)    (Lt) breast ca dx 02/2010   Breast cancer (HCC) 03/31/2011   Carotid artery disease (HCC)    Depression    Diastolic HF (heart failure) (HCC)    Dyspnea    GERD (gastroesophageal reflux disease) 12/17/2012   Hypertension    Hypertension 12/17/2012   Nausea alone 12/17/2012   Schizophrenia (HCC)     Assessment: Patient Reported Symptoms:  Cognitive Cognitive Status: No symptoms reported      Neurological Neurological Review of Symptoms: Not assessed    HEENT HEENT Symptoms Reported: Not assessed      Cardiovascular Cardiovascular Symptoms Reported: Not assessed    Respiratory Respiratory Symptoms Reported: Not assesed    Endocrine Endocrine Symptoms Reported: Not assessed    Gastrointestinal Gastrointestinal Symptoms Reported: Not assessed      Genitourinary Genitourinary Symptoms Reported: Not assessed    Integumentary Integumentary Symptoms Reported: Not assessed    Musculoskeletal Musculoskelatal Symptoms Reviewed: Not assessed        Psychosocial Psychosocial Symptoms Reported: No symptoms reported Behavioral Management Strategies: Coping strategies, Medication therapy Behavioral Health Self-Management Outcome: 4 (good) Major Change/Loss/Stressor/Fears (CP): Medical condition, self Techniques to Cope with Loss/Stress/Change: Diversional activities, Medication      11/16/2023    PHQ2-9 Depression Screening   Little interest or pleasure in doing things    Feeling down, depressed, or hopeless    PHQ-2 - Total Score    Trouble falling or staying asleep, or sleeping  too much    Feeling tired or having little energy    Poor appetite or overeating     Feeling bad about yourself - or that you are a failure or have let yourself or your family down    Trouble concentrating on things, such as reading the newspaper or watching television    Moving or speaking so slowly that other people could have noticed.  Or the opposite - being so fidgety or restless that you have been moving around a lot more than usual    Thoughts that you would be better off dead, or hurting yourself in some way    PHQ2-9 Total Score    If you checked off any problems, how difficult have these problems made it for you to do your work, take care of things at home, or get along with other people    Depression Interventions/Treatment      There were no vitals filed for this visit.  Medications Reviewed Today     Reviewed by Ezzard Rolin BIRCH, LCSW (Social Worker) on 11/16/23 at 1449  Med List Status: <None>   Medication Order Taking? Sig Documenting Provider Last Dose Status Informant  acetaminophen  (TYLENOL ) 500 MG tablet [id number]  Take 1,000 mg by mouth every 6 (six) hours as needed for mild pain (pain score 1-3) or moderate pain (pain score 4-6). [provider]  Active Self  ARIPiprazole (ABILIFY) 15 MG tablet [id number]  Take 15 mg by mouth at bedtime.  [provider]  Active Self  bismuth subsalicylate (PEPTO BISMOL) 262 MG/15ML suspension [id number]  Take 30 mLs  by mouth every 6 (six) hours as needed for indigestion. [provider]  Active Self  carboxymethylcellulose (REFRESH PLUS) 0.5 % SOLN [id number]  Place 1 drop into both eyes 3 (three) times daily as needed (dry/irritated eyes). [provider]  Active Self  Cholecalciferol (VITAMIN D) 125 MCG (5000 UT) CAPS [id number]  Take 5,000 Units by mouth daily. [provider]  Active Self  clotrimazole-betamethasone (LOTRISONE) cream [id number]  Apply 1 Application topically 2 (two) times daily  as needed (itching). [provider]  Active Self  escitalopram (LEXAPRO) 20 MG tablet [id number]  Take 20 mg by mouth at bedtime.  [provider]  Active Self  fluticasone (FLONASE) 50 MCG/ACT nasal spray [id number]  Place 1 spray into both nostrils as needed for allergies. [provider]  Active Self  furosemide  (LASIX ) 20 MG tablet [id number]  Take 20 mg by mouth daily. [provider]  Active Self  lisinopril  (ZESTRIL ) 10 MG tablet [id number]  Take 10 mg by mouth daily. [provider]  Active Self  methylphenidate (RITALIN) 10 MG tablet [id number]  Take 10 mg by mouth in the morning. [provider]  Active Self  rosuvastatin (CRESTOR) 20 MG tablet [id number]  Take 20 mg by mouth daily. [provider]  Active Self  spironolactone (ALDACTONE) 25 MG tablet [id number]  Take 25 mg by mouth 2 (two) times daily. [provider]  Active Self  VENTOLIN HFA 108 (90 Base) MCG/ACT inhaler [id number]  Inhale 2 puffs into the lungs every 6 (six) hours as needed for wheezing or shortness of breath.   Patient not taking: Reported on 10/02/2023   [provider]  Active Self           Med Note [name]   Sat Aug 11, 2023  5:15 PM) Patient in need of a refill.  vitamin B-12 (CYANOCOBALAMIN) 1000 MCG tablet [id number]  Take 1,000 mcg by mouth daily. [provider]  Active Self            Recommendation:   Continue Current Plan of Care  Follow Up Plan:   Patient has met all care management goals. Care Management case will be closed. Patient has been provided contact information should new needs arise.   Rolin Ezzard HUGHS [facility]  [facility], [facility] Clinical Social Worker Direct Dial: [phone]  Fax: [phone] Website: [email or web address] 2:54 PM

## 2023-11-16 NOTE — Patient Instructions (Signed)
 Visit Information  Thank you for taking time to visit with me today. Please don't hesitate to contact me if I can be of assistance to you before our next scheduled appointment.   Patient has met all care management goals. Care Management case will be closed. Patient has been provided contact information should new needs arise. Pt will continue to f/up with RNCM  Please call the care guide team at [phone] if you need to cancel, schedule, or reschedule an appointment.   Please call the Suicide and Crisis Lifeline: 988 go to [facility] Urgent [facility] [address], [place] [phone]) call 911 if you are experiencing a Mental Health or Behavioral Health Crisis or need someone to talk to.  Rolin Kerns, LCSW [place]  [facility], [facility] Clinical Social Worker Direct Dial: [phone]  Fax: [phone] Website: [email or web address] 2:55 PM

## 2023-11-20 ENCOUNTER — Other Ambulatory Visit: Payer: Self-pay | Admitting: *Deleted

## 2023-11-20 NOTE — Patient Instructions (Signed)
 Visit Information  Thank you for taking time to visit with me today. Please don't hesitate to contact me if I can be of assistance to you before our next scheduled appointment.  Your next care management appointment is by telephone on 12/18/2023 at 1030  Please call the care guide team at [phone] if you need to cancel, schedule, or reschedule an appointment.   Please call the Suicide and Crisis Lifeline: 988 call the [place]  National Suicide Prevention Lifeline: (801)222-4290 or TTY: (281)776-3471 TTY (828)742-0282) to talk to a trained counselor call 1-800-273-TALK (toll free, 24 hour hotline) if you are experiencing a Mental Health or Behavioral Health Crisis or need someone to talk to.   Olam Ku, RN, BSN [place]  [facility], [facility] RN Care Manager Direct Dial: [phone]  Fax: [phone]

## 2023-11-20 NOTE — Patient Outreach (Signed)
 Complex Care Management   Visit Note  11/20/2023  Name:  Elizabeth [name] MRN: [id number] DOB: 1945/11/24  Situation: Referral received for Complex Care Management related to Heart Failure and HTN I obtained verbal consent from Patient.  Visit completed with Patient  on the phone  Background:   Past Medical History:  Diagnosis Date   Anxiety    Arthritis    right knee   Breast CA (HCC)    (Lt) breast ca dx 02/2010   Breast cancer (HCC) 03/31/2011   Carotid artery disease (HCC)    Depression    Diastolic HF (heart failure) (HCC)    Dyspnea    GERD (gastroesophageal reflux disease) 12/17/2012   Hypertension    Hypertension 12/17/2012   Nausea alone 12/17/2012   Schizophrenia (HCC)     Assessment: Patient Reported Symptoms:  Cognitive Cognitive Status: Alert and oriented to person, place, and time, Able to follow simple commands, Normal speech and language skills      Neurological Neurological Review of Symptoms: No symptoms reported    HEENT HEENT Symptoms Reported: No symptoms reported      Cardiovascular Cardiovascular Symptoms Reported: No symptoms reported Does patient have uncontrolled Hypertension?:  (Last office visit to PCP read was 122/69 asymptomatic) Cardiovascular Management Strategies: Routine screening  Respiratory Respiratory Symptoms Reported: No symptoms reported Respiratory Management Strategies: Routine screening  Endocrine Endocrine Symptoms Reported: No symptoms reported    Gastrointestinal Gastrointestinal Symptoms Reported: No symptoms reported      Genitourinary Genitourinary Symptoms Reported: No symptoms reported    Integumentary Integumentary Symptoms Reported: No symptoms reported Skin Comment: History of rashes as patient utilizes the prescribed cream.  Musculoskeletal Musculoskelatal Symptoms Reviewed: No symptoms reported Musculoskeletal Management Strategies: Routine screening Falls in the past year?: No Number of falls in past  year: 1 or less Was there an injury with Fall?: No Fall Risk Category Calculator: 0 Patient Fall Risk Level: Low Fall Risk Fall risk Follow up: Education provided  Psychosocial Psychosocial Symptoms Reported: No symptoms reported     Quality of Family Relationships: helpful, involved, supportive Do you feel physically threatened by others?: No     There were no vitals filed for this visit.  Medications Reviewed Today     Reviewed by Alvia Olam BIRCH, RN (Registered Nurse) on 11/20/23 at 1016  Med List Status: <None>   Medication Order Taking? Sig Documenting Provider Last Dose Status Informant  acetaminophen  (TYLENOL ) 500 MG tablet [id number] Yes Take 1,000 mg by mouth every 6 (six) hours as needed for mild pain (pain score 1-3) or moderate pain (pain score 4-6). [provider]  Active Self  ARIPiprazole (ABILIFY) 15 MG tablet [id number] Yes Take 15 mg by mouth at bedtime.  [provider]  Active Self  bismuth subsalicylate (PEPTO BISMOL) 262 MG/15ML suspension [id number] Yes Take 30 mLs by mouth every 6 (six) hours as needed for indigestion. [provider]  Active Self  carboxymethylcellulose (REFRESH PLUS) 0.5 % SOLN [id number] Yes Place 1 drop into both eyes 3 (three) times daily as needed (dry/irritated eyes). [provider]  Active Self  Cholecalciferol (VITAMIN D) 125 MCG (5000 UT) CAPS [id number] Yes Take 5,000 Units by mouth daily. [provider]  Active Self  clotrimazole-betamethasone (LOTRISONE) cream [id number] Yes Apply 1 Application topically 2 (two) times daily as needed (itching). [provider]  Active Self  escitalopram (LEXAPRO) 20 MG tablet [id number] Yes Take 20 mg by mouth at bedtime.  [provider]  Active Self  fluticasone (FLONASE) 50 MCG/ACT nasal spray [id number] Yes Place 1 spray into both nostrils as needed for allergies. [provider]  Active Self  furosemide  (LASIX ) 20 MG tablet  [id number] Yes Take 20 mg by mouth daily. [provider]  Active Self  lisinopril  (ZESTRIL ) 10 MG tablet [id number] Yes Take 10 mg by mouth daily. [provider]  Active Self  methylphenidate (RITALIN) 10 MG tablet [id number] Yes Take 10 mg by mouth in the morning. [provider]  Active Self  montelukast (SINGULAIR) 10 MG tablet [id number] Yes Take 10 mg by mouth at bedtime. [provider]  Active   rosuvastatin (CRESTOR) 20 MG tablet [id number] Yes Take 20 mg by mouth daily. [provider]  Active Self  spironolactone (ALDACTONE) 25 MG tablet [id number] Yes Take 25 mg by mouth 2 (two) times daily. [provider]  Active Self  VENTOLIN HFA 108 (90 Base) MCG/ACT inhaler [id number]  Inhale 2 puffs into the lungs every 6 (six) hours as needed for wheezing or shortness of breath.   Patient not taking: Reported on 11/20/2023   [provider]  Active Self           Med Note [name]   Sat Aug 11, 2023  5:15 PM) Patient in need of a refill.  vitamin B-12 (CYANOCOBALAMIN) 1000 MCG tablet [id number] Yes Take 1,000 mcg by mouth daily. [provider]  Active Self            Recommendation:   PCP Follow-up Continue Current Plan of Care  Follow Up Plan:   Telephone follow up appointment date/time:  12/18/2023 @1030    Olam Ku, RN, BSN [place]  [facility], [facility] RN Care Manager Direct Dial: [phone]  Fax: [phone]

## 2023-12-18 ENCOUNTER — Other Ambulatory Visit: Payer: Self-pay | Admitting: *Deleted

## 2023-12-18 NOTE — Patient Outreach (Signed)
 Complex Care Management   Visit Note  12/18/2023  Name:  Elizabeth [name] MRN: [id number] DOB: Nov 12, 1945  Situation: Referral received for Complex Care Management related to Heart Failure and HTN I obtained verbal consent from Patient.  Visit completed with Patient  on the phone  Background:   Past Medical History:  Diagnosis Date   Anxiety    Arthritis    right knee   Breast CA (HCC)    (Lt) breast ca dx 02/2010   Breast cancer (HCC) 03/31/2011   Carotid artery disease    Depression    Diastolic HF (heart failure) (HCC)    Dyspnea    GERD (gastroesophageal reflux disease) 12/17/2012   Hypertension    Hypertension 12/17/2012   Nausea alone 12/17/2012   Schizophrenia (HCC)     Assessment: Patient Reported Symptoms:  Cognitive Cognitive Status: Alert and oriented to person, place, and time, Normal speech and language skills, Able to follow simple commands      Neurological Neurological Review of Symptoms: No symptoms reported    HEENT HEENT Symptoms Reported: No symptoms reported      Cardiovascular Cardiovascular Symptoms Reported: No symptoms reported Does patient have uncontrolled Hypertension?: Yes Is patient checking Blood Pressure at home?: No Patient's Recent BP reading at home: Patient reports her daughter who is outside the home usually takes her BP randomly. Denies any related symptoms with last reported BP on an office visit 140/80-8/12/25    Respiratory Respiratory Symptoms Reported: No symptoms reported Respiratory Management Strategies: Routine screening, Breathing exercise, Adequate rest  Endocrine Endocrine Symptoms Reported: No symptoms reported Is patient diabetic?: No (Patient reports she was informed she is prediabetic with no monitoring at this time. Denies any related symptoms)    Gastrointestinal Gastrointestinal Symptoms Reported: No symptoms reported      Genitourinary Genitourinary Symptoms Reported: No symptoms reported    Integumentary  Integumentary Symptoms Reported: No symptoms reported Skin Management Strategies: Routine screening  Musculoskeletal Musculoskelatal Symptoms Reviewed: Limited mobility Additional Musculoskeletal Details: Utilizes a cane with ambulation. No reported falls Musculoskeletal Management Strategies: Routine screening, Medical device      Psychosocial Psychosocial Symptoms Reported: No symptoms reported          There were no vitals filed for this visit.  Medications Reviewed Today     Reviewed by Alvia Olam BIRCH, RN (Registered Nurse) on 12/18/23 at 1057  Med List Status: <None>   Medication Order Taking? Sig Documenting Provider Last Dose Status Informant  acetaminophen  (TYLENOL ) 500 MG tablet [id number] Yes Take 1,000 mg by mouth every 6 (six) hours as needed for mild pain (pain score 1-3) or moderate pain (pain score 4-6). [provider]  Active Self  ARIPiprazole (ABILIFY) 15 MG tablet [id number] Yes Take 15 mg by mouth at bedtime.  [provider]  Active Self  bismuth subsalicylate (PEPTO BISMOL) 262 MG/15ML suspension [id number] Yes Take 30 mLs by mouth every 6 (six) hours as needed for indigestion. [provider]  Active Self  carboxymethylcellulose (REFRESH PLUS) 0.5 % SOLN [id number] Yes Place 1 drop into both eyes 3 (three) times daily as needed (dry/irritated eyes). [provider]  Active Self  Cholecalciferol (VITAMIN D) 125 MCG (5000 UT) CAPS [id number] Yes Take 5,000 Units by mouth daily. [provider]  Active Self  clotrimazole-betamethasone (LOTRISONE) cream [id number] Yes Apply 1 Application topically 2 (two) times daily as needed (itching). [provider]  Active Self  escitalopram (LEXAPRO) 20 MG tablet [id number] Yes Take 20 mg by  mouth at bedtime.  [provider]  Active Self  fluticasone (FLONASE) 50 MCG/ACT nasal spray [id number] Yes Place 1 spray into both nostrils as needed for allergies. [provider]  Active Self  furosemide  (LASIX ) 20 MG tablet [id number] Yes Take 20 mg by mouth daily. [provider]  Active Self  lisinopril  (ZESTRIL ) 10 MG tablet [id number] Yes Take 10 mg by mouth daily. [provider]  Active Self  methylphenidate (RITALIN) 10 MG tablet [id number] Yes Take 10 mg by mouth in the morning. [provider]  Active Self  montelukast (SINGULAIR) 10 MG tablet [id number] Yes Take 10 mg by mouth at bedtime. [provider]  Active   rosuvastatin (CRESTOR) 20 MG tablet [id number] Yes Take 20 mg by mouth daily. [provider]  Active Self  spironolactone (ALDACTONE) 25 MG tablet [id number] Yes Take 25 mg by mouth 2 (two) times daily. [provider]  Active Self  VENTOLIN HFA 108 (90 Base) MCG/ACT inhaler [id number]  Inhale 2 puffs into the lungs every 6 (six) hours as needed for wheezing or shortness of breath.   Patient not taking: Reported on 11/20/2023   [provider]  Active Self           Med Note [name]   Sat Aug 11, 2023  5:15 PM) Patient in need of a refill.  vitamin B-12 (CYANOCOBALAMIN) 1000 MCG tablet [id number] Yes Take 1,000 mcg by mouth daily. [provider]  Active Self            Recommendation:   PCP Follow-up Continue Current Plan of Care  Follow Up Plan:   Telephone follow up appointment date/time:  01/18/2024 @ 11:00 AM   Olam Ku, RN, BSN [place]  [facility], [facility] RN Care Manager Direct Dial: [phone]  Fax: [phone]

## 2023-12-18 NOTE — Patient Instructions (Signed)
 Visit Information  Thank you for taking time to visit with me today. Please don't hesitate to contact me if I can be of assistance to you before our next scheduled appointment.  Your next care management appointment is by telephone on 01/18/2024 at 11:00 AM   Please call the care guide team at [phone] if you need to cancel, schedule, or reschedule an appointment.   Please call the Suicide and Crisis Lifeline: 988 call the [place]  National Suicide Prevention Lifeline: (912) 660-8431 or TTY: 336-645-9512 TTY 819-500-9298) to talk to a trained counselor call 1-800-273-TALK (toll free, 24 hour hotline) if you are experiencing a Mental Health or Behavioral Health Crisis or need someone to talk to.   Olam Ku, RN, BSN [place]  [facility], [facility] RN Care Manager Direct Dial: [phone]  Fax: [phone]

## 2024-01-18 ENCOUNTER — Other Ambulatory Visit: Payer: Self-pay | Admitting: *Deleted

## 2024-01-18 NOTE — Patient Outreach (Signed)
 Complex Care Management   Visit Note  01/18/2024  Name:  Elizabeth [name] MRN: [id number] DOB: 05-24-1945  Situation: Referral received for Complex Care Management related to HF/HTN I obtained verbal consent from Patient.  Visit completed with Patient  on the phone  Background:   Past Medical History:  Diagnosis Date   Anxiety    Arthritis    right knee   Breast CA (HCC)    (Lt) breast ca dx 02/2010   Breast cancer (HCC) 03/31/2011   Carotid artery disease    Depression    Diastolic HF (heart failure) (HCC)    Dyspnea    GERD (gastroesophageal reflux disease) 12/17/2012   Hypertension    Hypertension 12/17/2012   Nausea alone 12/17/2012   Schizophrenia (HCC)     Assessment: Note RNCM has spoken with daughter in the past and provided permission to speak directly with the patient. RNCM initially called the daughter today with HIPAA voice message and obtained today's assessment information from the patient directly.  No acute issues reported at this time.  Patient Reported Symptoms:  Cognitive Cognitive Status: Able to follow simple commands, Alert and oriented to person, place, and time, Normal speech and language skills   Health Maintenance Behaviors: None  Neurological Neurological Review of Symptoms: No symptoms reported Neurological Management Strategies: Routine screening  HEENT HEENT Symptoms Reported: No symptoms reported HEENT Management Strategies: Routine screening    Cardiovascular Cardiovascular Symptoms Reported: No symptoms reported Does patient have uncontrolled Hypertension?: Yes Is patient checking Blood Pressure at home?: No Patient's Recent BP reading at home: Pt continue to indicates she will obtain a bp device for weekly checks. No new readings reported other then EPIC review of 140/80 remains asymptomatic at this time. Cardiovascular Management Strategies: Routine screening, Coping strategies, Medication therapy, Adequate rest Cardiovascular  Self-Management Outcome: 4 (good) Cardiovascular Comment: Denies any symptoms related to fluid retention (CHF) with weekly weights consistent at 218 lbs  Respiratory Respiratory Symptoms Reported: No symptoms reported Additional Respiratory Details: Allegeries takes prescribed Singular and OTC antihistamine Respiratory Management Strategies: Routine screening Respiratory Self-Management Outcome: 4 (good)  Endocrine Endocrine Symptoms Reported: No symptoms reported Is patient diabetic?: No    Gastrointestinal Gastrointestinal Symptoms Reported: No symptoms reported Additional Gastrointestinal Details: Takes stool softeners when needed OTC      Genitourinary Genitourinary Symptoms Reported: No symptoms reported    Integumentary Integumentary Symptoms Reported: No symptoms reported Skin Management Strategies: Routine screening Skin Comment: Continue to has occassional rash and uses her prescribed cream for good resolution.  Musculoskeletal Musculoskelatal Symptoms Reviewed: Limited mobility Additional Musculoskeletal Details: Continue to use her cane when ambulating. No falls reported since last assessment Musculoskeletal Management Strategies: Routine screening, Medical device, Coping strategies      Psychosocial Psychosocial Symptoms Reported: No symptoms reported Behavioral Management Strategies: Support system Major Change/Loss/Stressor/Fears (CP): Denies Quality of Family Relationships: helpful, involved, supportive Do you feel physically threatened by others?: No    01/18/2024    PHQ2-9 Depression Screening   Little interest or pleasure in doing things Not at all  Feeling down, depressed, or hopeless Not at all  PHQ-2 - Total Score 0  Trouble falling or staying asleep, or sleeping too much    Feeling tired or having little energy    Poor appetite or overeating     Feeling bad about yourself - or that you are a failure or have let yourself or your family down    Trouble  concentrating on things, such as reading the  newspaper or watching television    Moving or speaking so slowly that other people could have noticed.  Or the opposite - being so fidgety or restless that you have been moving around a lot more than usual    Thoughts that you would be better off dead, or hurting yourself in some way    PHQ2-9 Total Score    If you checked off any problems, how difficult have these problems made it for you to do your work, take care of things at home, or get along with other people    Depression Interventions/Treatment      There were no vitals filed for this visit. Pain Scale: 0-10 Pain Score: 0-No pain  Medications Reviewed Today     Reviewed by Alvia Olam BIRCH, RN (Registered Nurse) on 01/18/24 at 1115  Med List Status: <None>   Medication Order Taking? Sig Documenting Provider Last Dose Status Informant  acetaminophen  (TYLENOL ) 500 MG tablet [id number] Yes Take 1,000 mg by mouth every 6 (six) hours as needed for mild pain (pain score 1-3) or moderate pain (pain score 4-6). [provider]  Active Self  ARIPiprazole (ABILIFY) 15 MG tablet [id number] Yes Take 15 mg by mouth at bedtime.  [provider]  Active Self  bismuth subsalicylate (PEPTO BISMOL) 262 MG/15ML suspension [id number] Yes Take 30 mLs by mouth every 6 (six) hours as needed for indigestion. [provider]  Active Self  carboxymethylcellulose (REFRESH PLUS) 0.5 % SOLN [id number] Yes Place 1 drop into both eyes 3 (three) times daily as needed (dry/irritated eyes). [provider]  Active Self  Cholecalciferol (VITAMIN D) 125 MCG (5000 UT) CAPS [id number] Yes Take 5,000 Units by mouth daily. [provider]  Active Self  clotrimazole-betamethasone (LOTRISONE) cream [id number] Yes Apply 1 Application topically 2 (two) times daily as needed (itching). [provider]  Active Self  escitalopram (LEXAPRO) 20 MG tablet [id number] Yes Take 20 mg by mouth at  bedtime.  [provider]  Active Self  fluticasone (FLONASE) 50 MCG/ACT nasal spray [id number] Yes Place 1 spray into both nostrils as needed for allergies. [provider]  Active Self  furosemide  (LASIX ) 20 MG tablet [id number] Yes Take 20 mg by mouth daily. [provider]  Active Self  lisinopril  (ZESTRIL ) 10 MG tablet [id number] Yes Take 10 mg by mouth daily. [provider]  Active Self  methylphenidate (RITALIN) 10 MG tablet [id number] Yes Take 10 mg by mouth in the morning. [provider]  Active Self  montelukast (SINGULAIR) 10 MG tablet [id number] Yes Take 10 mg by mouth at bedtime. [provider]  Active   rosuvastatin (CRESTOR) 20 MG tablet [id number] Yes Take 20 mg by mouth daily. [provider]  Active Self  spironolactone (ALDACTONE) 25 MG tablet [id number] Yes Take 25 mg by mouth 2 (two) times daily. [provider]  Active Self  VENTOLIN HFA 108 (90 Base) MCG/ACT inhaler [id number]  Inhale 2 puffs into the lungs every 6 (six) hours as needed for wheezing or shortness of breath.   Patient not taking: Reported on 11/20/2023   [provider]  Active Self           Med Note [name]   Sat Aug 11, 2023  5:15 PM) Patient in need of a refill.  vitamin B-12 (CYANOCOBALAMIN) 1000 MCG tablet [id number] Yes Take 1,000 mcg by mouth daily. [provider]  Active Self  Recommendation:   PCP Follow-up Continue Current Plan of Care  Follow Up Plan:   Telephone follow up appointment date/time:  02/15/2024 @ 9:00 am   Olam Ku, RN, BSN [place]  [facility], [facility] RN Care Manager Direct Dial: [phone]  Fax: [phone]

## 2024-01-18 NOTE — Patient Instructions (Signed)
 Visit Information  Thank you for taking time to visit with me today. Please don't hesitate to contact me if I can be of assistance to you before our next scheduled appointment.  Your next care management appointment is by telephone on 02/15/2024  at 9:00 am  Please call the care guide team at [phone] if you need to cancel, schedule, or reschedule an appointment.   Please call the Suicide and Crisis Lifeline: 988 call the [place]  National Suicide Prevention Lifeline: 870-448-1936 or TTY: 970 100 4295 TTY 226-536-4839) to talk to a trained counselor call 1-800-273-TALK (toll free, 24 hour hotline) if you are experiencing a Mental Health or Behavioral Health Crisis or need someone to talk to.  Olam Ku, RN, BSN [place]  [facility], [facility] RN Care Manager Direct Dial: [phone]  Fax: [phone]

## 2024-01-21 DIAGNOSIS — E559 Vitamin D deficiency, unspecified: Secondary | ICD-10-CM | POA: Diagnosis not present

## 2024-01-21 DIAGNOSIS — G4733 Obstructive sleep apnea (adult) (pediatric): Secondary | ICD-10-CM | POA: Diagnosis not present

## 2024-01-21 DIAGNOSIS — E782 Mixed hyperlipidemia: Secondary | ICD-10-CM | POA: Diagnosis not present

## 2024-01-21 DIAGNOSIS — Z23 Encounter for immunization: Secondary | ICD-10-CM | POA: Diagnosis not present

## 2024-01-21 DIAGNOSIS — I5032 Chronic diastolic (congestive) heart failure: Secondary | ICD-10-CM | POA: Diagnosis not present

## 2024-01-21 DIAGNOSIS — N1831 Chronic kidney disease, stage 3a: Secondary | ICD-10-CM | POA: Diagnosis not present

## 2024-01-21 DIAGNOSIS — R7303 Prediabetes: Secondary | ICD-10-CM | POA: Diagnosis not present

## 2024-01-21 DIAGNOSIS — J302 Other seasonal allergic rhinitis: Secondary | ICD-10-CM | POA: Diagnosis not present

## 2024-01-21 DIAGNOSIS — I130 Hypertensive heart and chronic kidney disease with heart failure and stage 1 through stage 4 chronic kidney disease, or unspecified chronic kidney disease: Secondary | ICD-10-CM | POA: Diagnosis not present

## 2024-01-21 DIAGNOSIS — D649 Anemia, unspecified: Secondary | ICD-10-CM | POA: Diagnosis not present

## 2024-02-04 DIAGNOSIS — F251 Schizoaffective disorder, depressive type: Secondary | ICD-10-CM | POA: Diagnosis not present

## 2024-02-14 DIAGNOSIS — J302 Other seasonal allergic rhinitis: Secondary | ICD-10-CM | POA: Diagnosis not present

## 2024-02-14 DIAGNOSIS — N1831 Chronic kidney disease, stage 3a: Secondary | ICD-10-CM | POA: Diagnosis not present

## 2024-02-14 DIAGNOSIS — I5032 Chronic diastolic (congestive) heart failure: Secondary | ICD-10-CM | POA: Diagnosis not present

## 2024-02-14 DIAGNOSIS — R7303 Prediabetes: Secondary | ICD-10-CM | POA: Diagnosis not present

## 2024-02-14 DIAGNOSIS — E559 Vitamin D deficiency, unspecified: Secondary | ICD-10-CM | POA: Diagnosis not present

## 2024-02-14 DIAGNOSIS — I130 Hypertensive heart and chronic kidney disease with heart failure and stage 1 through stage 4 chronic kidney disease, or unspecified chronic kidney disease: Secondary | ICD-10-CM | POA: Diagnosis not present

## 2024-02-14 DIAGNOSIS — E782 Mixed hyperlipidemia: Secondary | ICD-10-CM | POA: Diagnosis not present

## 2024-02-14 DIAGNOSIS — G4733 Obstructive sleep apnea (adult) (pediatric): Secondary | ICD-10-CM | POA: Diagnosis not present

## 2024-02-15 ENCOUNTER — Other Ambulatory Visit: Payer: Self-pay | Admitting: *Deleted

## 2024-02-15 NOTE — Patient Outreach (Signed)
 Complex Care Management   Visit Note  02/15/2024  Name:  Elizabeth [name] MRN: [id number] DOB: 08/22/1945  Situation: Referral received for Complex Care Management related to Heart Failure and HTN I obtained verbal consent from Patient.  Visit completed with Patient  on the phone  Background:   Past Medical History:  Diagnosis Date   Anxiety    Arthritis    right knee   Breast CA (HCC)    (Lt) breast ca dx 02/2010   Breast cancer (HCC) 03/31/2011   Carotid artery disease    Depression    Diastolic HF (heart failure) (HCC)    Dyspnea    GERD (gastroesophageal reflux disease) 12/17/2012   Hypertension    Hypertension 12/17/2012   Nausea alone 12/17/2012   Schizophrenia (HCC)     Assessment: Patient Reported Symptoms:  Cognitive Cognitive Status: Able to follow simple commands, Alert and oriented to person, place, and time, Normal speech and language skills   Health Maintenance Behaviors: None Healing Pattern: Average Health Facilitated by: Healthy diet  Neurological Neurological Review of Symptoms: No symptoms reported Neurological Management Strategies: Routine screening Neurological Self-Management Outcome: 4 (good)  HEENT HEENT Symptoms Reported: No symptoms reported HEENT Management Strategies: Routine screening HEENT Self-Management Outcome: 4 (good)    Cardiovascular Cardiovascular Symptoms Reported: No symptoms reported Does patient have uncontrolled Hypertension?: Yes Is patient checking Blood Pressure at home?: No Patient's Recent BP reading at home: PCP visit yesterday with elvated bp that maybe r/t psychiatric medication. RNCM also spoke with the daughter for details [name] on the event yesterday with elevated bp. Pt currently awaiting psychiatric provider to return call for intervention. Pt does not remember the readings. Daughter is assisting with this process. Cardiovascular Management Strategies: Coping strategies, Routine screening, Medication therapy,  Adequate rest Cardiovascular Self-Management Outcome: 4 (good) Cardiovascular Comment: Denies any symptoms related to fluid retention (CHF) with her weight currently at 224 lbs. Pt reports she has been eating more with the weight gained.  Pt reports her increase weight is not fluid but r/t to the recent medication (psychiatric medication). Both the pt and daughter are aware of when to seek medical attention with prececipitating symptoms.  Respiratory Respiratory Symptoms Reported: Productive cough (Occassional productive cough that is clear just a little ever now and then. Denies fevers, chills  and N/V.) Additional Respiratory Details: Allegeries takes prescribed Singular and OTC antihistamine Respiratory Management Strategies: Coping strategies, Routine screening, Adequate rest, Medication therapy Respiratory Self-Management Outcome: 4 (good)  Endocrine Endocrine Symptoms Reported: No symptoms reported Is patient diabetic?: No Endocrine Self-Management Outcome: 4 (good)  Gastrointestinal Gastrointestinal Symptoms Reported: No symptoms reported Additional Gastrointestinal Details: Stool softeners available when needed OTC. Gastrointestinal Management Strategies: Adequate rest, Medication therapy Gastrointestinal Self-Management Outcome: 4 (good)    Genitourinary Genitourinary Symptoms Reported: No symptoms reported Genitourinary Management Strategies: Adequate rest Genitourinary Self-Management Outcome: 4 (good)  Integumentary Integumentary Symptoms Reported: No symptoms reported Skin Management Strategies: Routine screening, Medication therapy Skin Self-Management Outcome: 4 (good) Skin Comment: Continue to has occassional rash and uses her prescribed cream for good resolution.  Musculoskeletal Musculoskelatal Symptoms Reviewed: Limited mobility Additional Musculoskeletal Details: Continue to use her cane when ambulating. No falls reported since last assessment Musculoskeletal Management  Strategies: Routine screening, Coping strategies, Medication therapy (Takes Tylenol  when needed) Musculoskeletal Self-Management Outcome: 4 (good)      Psychosocial Psychosocial Symptoms Reported: No symptoms reported Behavioral Management Strategies: Support system Behavioral Health Self-Management Outcome: 4 (good) Major Change/Loss/Stressor/Fears (CP): Denies  There were no vitals filed for this visit. Pain Scale: 0-10 Pain Score: 0-No pain  Medications Reviewed Today     Reviewed by Alvia Olam BIRCH, RN (Registered Nurse) on 02/15/24 at [phone]  Med List Status: <None>   Medication Order Taking? Sig Documenting Provider Last Dose Status Informant  acetaminophen  (TYLENOL ) 500 MG tablet [id number] Yes Take 1,000 mg by mouth every 6 (six) hours as needed for mild pain (pain score 1-3) or moderate pain (pain score 4-6). [provider]  Active Self  ARIPiprazole (ABILIFY) 15 MG tablet [id number] Yes Take 15 mg by mouth at bedtime.  [provider]  Active Self  bismuth subsalicylate (PEPTO BISMOL) 262 MG/15ML suspension [id number] Yes Take 30 mLs by mouth every 6 (six) hours as needed for indigestion. [provider]  Active Self  carboxymethylcellulose (REFRESH PLUS) 0.5 % SOLN [id number] Yes Place 1 drop into both eyes 3 (three) times daily as needed (dry/irritated eyes). [provider]  Active Self  Cholecalciferol (VITAMIN D) 125 MCG (5000 UT) CAPS [id number] Yes Take 5,000 Units by mouth daily. [provider]  Active Self  clotrimazole-betamethasone (LOTRISONE) cream [id number] Yes Apply 1 Application topically 2 (two) times daily as needed (itching). [provider]  Active Self  escitalopram (LEXAPRO) 20 MG tablet [id number] Yes Take 20 mg by mouth at bedtime.  [provider]  Active Self  fluticasone (FLONASE) 50 MCG/ACT nasal spray [id number] Yes Place 1 spray into both nostrils as needed for allergies. [provider]  Active Self  furosemide  (LASIX ) 20 MG tablet [id number] Yes Take 20 mg by mouth daily. [provider]  Active Self  lisinopril  (ZESTRIL ) 10 MG tablet [id number] Yes Take 10 mg by mouth daily. [provider]  Active Self  methylphenidate (RITALIN) 10 MG tablet [id number] Yes Take 10 mg by mouth in the morning. [provider]  Active Self  montelukast (SINGULAIR) 10 MG tablet [id number] Yes Take 10 mg by mouth at bedtime. [provider]  Active   rosuvastatin (CRESTOR) 20 MG tablet [id number] Yes Take 20 mg by mouth daily. [provider]  Active Self  spironolactone (ALDACTONE) 25 MG tablet [id number] Yes Take 25 mg by mouth 2 (two) times daily. [provider]  Active Self  VENTOLIN HFA 108 (90 Base) MCG/ACT inhaler [id number]  Inhale 2 puffs into the lungs every 6 (six) hours as needed for wheezing or shortness of breath.   Patient not taking: Reported on 11/20/2023   [provider]  Active Self           Med Note [name]   Sat Aug 11, 2023  5:15 PM) Patient in need of a refill.  vitamin B-12 (CYANOCOBALAMIN) 1000 MCG tablet [id number] Yes Take 1,000 mcg by mouth daily. [provider]  Active Self            Recommendation:   PCP Follow-up Continue Current Plan of Care  Follow Up Plan:   Telephone follow up appointment date/time:  03/14/2024 @ 9:30 AM   Olam Alvia, RN, BSN [place]  [facility] RN Care Manager Direct Dial: [phone]  Fax: [phone]

## 2024-02-15 NOTE — Patient Instructions (Signed)
 Visit Information  Thank you for taking time to visit with me today. Please don't hesitate to contact me if I can be of assistance to you before our next scheduled appointment.  Your next care management appointment is by telephone on 03/14/2024 @ 9:30 AM   Please call the care guide team at [phone] if you need to cancel, schedule, or reschedule an appointment.   Please call the Suicide and Crisis Lifeline: 988 call the [place]  National Suicide Prevention Lifeline: 540 483 8639 or TTY: (770) 845-5703 TTY 5402790757) to talk to a trained counselor call 1-800-273-TALK (toll free, 24 hour hotline) if you are experiencing a Mental Health or Behavioral Health Crisis or need someone to talk to.  Olam Ku, RN, BSN [place]  [facility], [facility] RN Care Manager Direct Dial: [phone]  Fax: [phone]

## 2024-02-27 ENCOUNTER — Emergency Department (HOSPITAL_COMMUNITY)

## 2024-02-27 ENCOUNTER — Other Ambulatory Visit: Payer: Self-pay

## 2024-02-27 ENCOUNTER — Emergency Department (HOSPITAL_COMMUNITY)
Admission: EM | Admit: 2024-02-27 | Discharge: 2024-02-27 | Disposition: A | Discharge status: Home / Self Care | Attending: Emergency Medicine | Admitting: Emergency Medicine

## 2024-02-27 DIAGNOSIS — J101 Influenza due to other identified influenza virus with other respiratory manifestations: Secondary | ICD-10-CM | POA: Diagnosis not present

## 2024-02-27 DIAGNOSIS — R059 Cough, unspecified: Secondary | ICD-10-CM | POA: Diagnosis present

## 2024-02-27 LAB — GROUP A STREP BY PCR: Group A Strep by PCR: NOT DETECTED

## 2024-02-27 LAB — RESP PANEL BY RT-PCR (RSV, FLU A&B, COVID)  RVPGX2
Influenza A by PCR: POSITIVE — AB
Influenza B by PCR: NEGATIVE
Resp Syncytial Virus by PCR: NEGATIVE
SARS Coronavirus 2 by RT PCR: NEGATIVE

## 2024-02-27 MED ORDER — PREDNISONE 20 MG PO TABS
40.0000 mg | ORAL_TABLET | Freq: Once | ORAL | Status: AC
  Administered 2024-02-27: 40 mg via ORAL
  Filled 2024-02-27: qty 2

## 2024-02-27 MED ORDER — PREDNISONE 20 MG PO TABS: 40.0000 mg | ORAL_TABLET | Freq: Every day | ORAL | 0 refills | Status: AC

## 2024-02-27 MED ORDER — ALBUTEROL SULFATE HFA 108 (90 BASE) MCG/ACT IN AERS
2.0000 | INHALATION_SPRAY | Freq: Once | RESPIRATORY_TRACT | Status: AC
  Administered 2024-02-27: 2 via RESPIRATORY_TRACT
  Filled 2024-02-27: qty 6.7

## 2024-02-27 MED ORDER — IPRATROPIUM-ALBUTEROL 0.5-2.5 (3) MG/3ML IN SOLN
3.0000 mL | Freq: Once | RESPIRATORY_TRACT | Status: AC
  Administered 2024-02-27: 3 mL via RESPIRATORY_TRACT
  Filled 2024-02-27: qty 3

## 2024-02-27 MED ORDER — ACETAMINOPHEN 325 MG PO TABS
650.0000 mg | ORAL_TABLET | Freq: Once | ORAL | Status: AC | PRN
  Administered 2024-02-27: 650 mg via ORAL
  Filled 2024-02-27: qty 2

## 2024-02-27 MED ORDER — OSELTAMIVIR PHOSPHATE 75 MG PO CAPS
75.0000 mg | ORAL_CAPSULE | Freq: Once | ORAL | Status: AC
  Administered 2024-02-27: 75 mg via ORAL
  Filled 2024-02-27: qty 1

## 2024-02-27 MED ORDER — OSELTAMIVIR PHOSPHATE 75 MG PO CAPS: 75.0000 mg | ORAL_CAPSULE | Freq: Two times a day (BID) | ORAL | 0 refills | Status: AC

## 2024-02-27 NOTE — ED Notes (Signed)
 Pt oxygen saturations remained 96% and above while ambulating.

## 2024-02-27 NOTE — ED Triage Notes (Signed)
-  FEVER x yesterday  -cough productive x 2 days -weakness generalized

## 2024-02-27 NOTE — Discharge Instructions (Addendum)
 You have influenza.  Chest x-ray did not show any concerns.  Your oxygen levels were normal today.  I have sent Tamiflu  into the pharmacy for you along with a course of prednisone .  You are able to walk around without drop in the oxygen saturation.  Return for any concerning symptoms.  Continue taking Tylenol  for fever control and bodyaches.  Drink plenty of fluids.  Follow-up with your primary care doctor as soon as possible for reevaluation.

## 2024-02-27 NOTE — ED Provider Notes (Signed)
 "  EMERGENCY DEPARTMENT AT [facility] Provider Note   CSN: [id number] Arrival date & time: 02/27/24  1414     Patient presents with: URI   Elizabeth [name] is a 78 y.o. female.   78 year old female presents today for concern of cough and weakness.  Cough started on Sunday.  Weakness started today.  Accompanied by daughter.  However patient lives alone and is independent.  Does have a PCP.  However their office is closed for the holidays.  No prior history of asthma or COPD.  The history is provided by the patient and a relative. No language interpreter was used.       Prior to Admission medications  Medication Sig Start Date End Date Taking? Authorizing Provider  acetaminophen  (TYLENOL ) 500 MG tablet Take 1,000 mg by mouth every 6 (six) hours as needed for mild pain (pain score 1-3) or moderate pain (pain score 4-6).    [provider]  ARIPiprazole (ABILIFY) 15 MG tablet Take 15 mg by mouth at bedtime.  08/15/17   [provider]  bismuth subsalicylate (PEPTO BISMOL) 262 MG/15ML suspension Take 30 mLs by mouth every 6 (six) hours as needed for indigestion.    [provider]  carboxymethylcellulose (REFRESH PLUS) 0.5 % SOLN Place 1 drop into both eyes 3 (three) times daily as needed (dry/irritated eyes).    [provider]  Cholecalciferol (VITAMIN D) 125 MCG (5000 UT) CAPS Take 5,000 Units by mouth daily.    [provider]  clotrimazole-betamethasone (LOTRISONE) cream Apply 1 Application topically 2 (two) times daily as needed (itching).    [provider]  escitalopram (LEXAPRO) 20 MG tablet Take 20 mg by mouth at bedtime.     [provider]  fluticasone (FLONASE) 50 MCG/ACT nasal spray Place 1 spray into both nostrils as needed for allergies. 12/10/19   [provider]  furosemide  (LASIX ) 20 MG tablet Take 20 mg by mouth daily.    [provider]  lisinopril  (ZESTRIL ) 10 MG tablet  Take 10 mg by mouth daily. 04/25/21   [provider]  methylphenidate (RITALIN) 10 MG tablet Take 10 mg by mouth in the morning.    [provider]  montelukast (SINGULAIR) 10 MG tablet Take 10 mg by mouth at bedtime.    [provider]  rosuvastatin (CRESTOR) 20 MG tablet Take 20 mg by mouth daily. 09/22/19   [provider]  spironolactone (ALDACTONE) 25 MG tablet Take 25 mg by mouth 2 (two) times daily.    [provider]  VENTOLIN  HFA 108 (90 Base) MCG/ACT inhaler Inhale 2 puffs into the lungs every 6 (six) hours as needed for wheezing or shortness of breath.  Patient not taking: Reported on 11/20/2023 08/25/17   [provider]  vitamin B-12 (CYANOCOBALAMIN) 1000 MCG tablet Take 1,000 mcg by mouth daily.    [provider]    Allergies: Amlodipine , Ciprofloxacin, Ofloxacin, Sulfamethoxazole-trimethoprim, and Benztropine    Review of Systems  Constitutional:  Positive for chills, fatigue and fever.  Respiratory:  Positive for cough. Negative for shortness of breath.   Cardiovascular:  Negative for chest pain.  Neurological:  Negative for light-headedness.  All other systems reviewed and are negative.   Updated Vital Signs BP (!) 154/58 (BP Location: Right Arm)   Pulse 77   Temp 100.2 F (37.9 C) (Oral)   Resp 16   Ht 5' 1 (1.549 m)   Wt 98.9 kg   SpO2 97%  BMI 41.19 kg/m   Physical Exam Vitals and nursing note reviewed.  Constitutional:      General: She is not in acute distress.    Appearance: Normal appearance. She is not ill-appearing.  HENT:     Head: Normocephalic and atraumatic.     Nose: Nose normal.  Eyes:     Conjunctiva/sclera: Conjunctivae normal.  Cardiovascular:     Rate and Rhythm: Normal rate and regular rhythm.  Pulmonary:     Effort: Pulmonary effort is normal. No respiratory distress.     Breath sounds: Wheezing present.     Comments: Coarse lung sounds all throughout Musculoskeletal:         General: No deformity. Normal range of motion.  Skin:    Findings: No rash.  Neurological:     Mental Status: She is alert.     (all labs ordered are listed, but only abnormal results are displayed) Labs Reviewed  RESP PANEL BY RT-PCR (RSV, FLU A&B, COVID)  [id number] - Abnormal; Notable for the following components:      Result Value   Influenza A by PCR POSITIVE (*)    All other components within normal limits  GROUP A STREP BY PCR    EKG: None  Radiology: DG Chest Portable 1 View Result Date: 02/27/2024 EXAM: 1 VIEW(S) XRAY OF THE CHEST 02/27/2024 04:01:00 PM COMPARISON: 09/23/2023 CLINICAL HISTORY: cough x2 weeks, fever FINDINGS: LUNGS AND PLEURA: No focal pulmonary opacity. No pleural effusion. No pneumothorax. HEART AND MEDIASTINUM: Mild cardiomegaly. Tortuous aorta with aortic atherosclerosis. BONES AND SOFT TISSUES: No acute osseous abnormality. Multilevel thoracic osteophytosis. IMPRESSION: 1. No acute cardiopulmonary abnormality. Electronically signed by: Rogelia Myers MD 02/27/2024 04:09 PM EST RP Workstation: [id number]     Procedures   Medications Ordered in the ED  oseltamivir  (TAMIFLU ) capsule 75 mg (has no administration in time range)  albuterol  (VENTOLIN  HFA) 108 (90 Base) MCG/ACT inhaler 2 puff (has no administration in time range)  acetaminophen  (TYLENOL ) tablet 650 mg (650 mg Oral Given 02/27/24 1430)    Clinical Course as of 02/27/24 1729  Wed Feb 27, 2024  1728 Patient ambulated on room air and maintain saturations of 96 and above. [AA]    Clinical Course User Index [AA] Hildegard Loge, PA-C                                 Medical Decision Making Risk OTC drugs. Prescription drug management.   78 year old female presents today for concern of URI symptoms and weakness.  Symptoms started Sunday.  Became weak today. Patient lives alone and is able to function independently. Daughter can check in frequently. Chest x-ray without acute  cardiopulmonary process. Strep test negative. Flu positive. Will prescribe Tamiflu . Albuterol  inhaler given for the occasional wheeze heard on auscultation.  She will follow closely with her PCP. Strict return precautions discussed.  .  Patient ambulated around the emergency department without issue and maintained saturations of 96% and above on room air.  However daughter informed that patient was pretty dyspneic with ambulating.  Will give breathing treatment.  Patient evaluated by attending as well.  On reevaluation patient feels improved but does still sound coarse.  Will add prednisone  to discharge regimen.  Offered admission but they prefer discharge.  Patient will stay with her daughter and son-in-law. They feel comfortable with plan.  Discharged in stable condition.  Final diagnoses:  Influenza A    ED Discharge Orders  Ordered    predniSONE  (DELTASONE ) 20 MG tablet  Daily with breakfast        02/27/24 1848    oseltamivir  (TAMIFLU ) 75 MG capsule  Every 12 hours        02/27/24 1734               Hildegard Loge, PA-C 02/27/24 1850    Rogelia Jerilynn RAMAN, MD 02/27/24 2047  "

## 2024-03-14 ENCOUNTER — Other Ambulatory Visit: Payer: Self-pay | Admitting: *Deleted

## 2024-03-14 NOTE — Patient Outreach (Signed)
 Complex Care Management   Visit Note  03/14/2024  Name:  Elizabeth [name] MRN: [id number] DOB: Jun 29, 1945  Situation: Referral received for Complex Care Management related to Heart Failure and HTN. I obtained verbal consent from Patient.  Visit completed with Patient  on the phone  Background:   Past Medical History:  Diagnosis Date   Anxiety    Arthritis    right knee   Breast CA (HCC)    (Lt) breast ca dx 02/2010   Breast cancer (HCC) 03/31/2011   Carotid artery disease    Depression    Diastolic HF (heart failure) (HCC)    Dyspnea    GERD (gastroesophageal reflux disease) 12/17/2012   Hypertension    Hypertension 12/17/2012   Nausea alone 12/17/2012   Schizophrenia (HCC)     Assessment: Pt no longer take psychiatric medication that caused side effects. Pt will continue to follow up with her mental health provider [facility]). Pt had a ED visit with the FLU and completed the prescribed medication regimen feeling better. Pt has f/u with her PCP post hospital visit and her next PCP visit in scheduled in April reported by patient.  Patient Reported Symptoms:  Cognitive Cognitive Status: Able to follow simple commands, Alert and oriented to person, place, and time, Normal speech and language skills   Health Maintenance Behaviors: Annual physical exam Healing Pattern: Average Health Facilitated by: Healthy diet, Rest  Neurological Neurological Review of Symptoms: No symptoms reported Neurological Management Strategies: Coping strategies, Routine screening Neurological Self-Management Outcome: 4 (good)  HEENT HEENT Symptoms Reported: No symptoms reported HEENT Management Strategies: Coping strategies, Routine screening HEENT Self-Management Outcome: 4 (good)    Cardiovascular Cardiovascular Symptoms Reported: No symptoms reported Does patient have uncontrolled Hypertension?: Yes Is patient checking Blood Pressure at home?: Yes Patient's Recent BP reading at home: Pt reports  home device reading last evening was 152/66 and she remains asymptomatic with no flucuating readings. Cardiovascular Management Strategies: Coping strategies, Routine screening Weight: 220 lb (99.8 kg) Cardiovascular Self-Management Outcome: 4 (good) Cardiovascular Comment: Pt reports her reading today 220 lbs and last week 219 lbs as she remains asymtomatic and continue to manage her HF remaining in the GREEN zone. Pt is aware of precipitating symptoms and when to contact her provider.  Respiratory Respiratory Symptoms Reported: Dry cough (Pt reports ongoing dry cough. Discussed OTC remedies with use of a humdifier, honey with tea, chloraseptic spray and lozengers.) Other Respiratory Symptoms: Encouraged pt to wear a mask with all outings until she has recovered from the flu illness. Denies fevers, chills and N/V. Pt aware to discuss with provider on any new medications. Additional Respiratory Details: Allegeries takes prescribed Singular and OTC antihistamine Respiratory Management Strategies: Coping strategies, Medication therapy, Routine screening, Adequate rest Respiratory Self-Management Outcome: 4 (good)  Endocrine Endocrine Symptoms Reported: No symptoms reported Is patient diabetic?: No Endocrine Self-Management Outcome: 4 (good)  Gastrointestinal Gastrointestinal Symptoms Reported: No symptoms reported Additional Gastrointestinal Details: Stool softeners available when needed OTC. Gastrointestinal Management Strategies: Adequate rest, Medication therapy Gastrointestinal Self-Management Outcome: 4 (good)    Genitourinary Genitourinary Symptoms Reported: No symptoms reported Genitourinary Management Strategies: Adequate rest, Coping strategies Genitourinary Self-Management Outcome: 4 (good)  Integumentary Integumentary Symptoms Reported: Rash Additional Integumentary Details: Pt has prescribed medication when needed for her outbreaks with her rash Skin Management Strategies: Coping  strategies, Medication therapy, Routine screening Skin Comment: Continue to has occassional rash and uses her prescribed cream for good resolution.  Musculoskeletal Musculoskelatal Symptoms Reviewed: Limited mobility Additional Musculoskeletal  Details: Continue to use her cane when ambulating. No falls reported since last assessment Musculoskeletal Management Strategies: Coping strategies, Medication therapy, Routine screening Musculoskeletal Self-Management Outcome: 4 (good)      Psychosocial Psychosocial Symptoms Reported: No symptoms reported Behavioral Management Strategies: Support system Behavioral Health Self-Management Outcome: 4 (good) Major Change/Loss/Stressor/Fears (CP): Denies Quality of Family Relationships: helpful, involved, supportive Do you feel physically threatened by others?: No    Today's Vitals   03/14/24 0952  Weight: 220 lb (99.8 kg)   Pain Scale: 0-10 Pain Score: 0-No pain  Medications Reviewed Today     Reviewed by Alvia Olam BIRCH, RN (Registered Nurse) on 03/14/24 at [phone]  Med List Status: <None>   Medication Order Taking? Sig Documenting Provider Last Dose Status Informant  acetaminophen  (TYLENOL ) 500 MG tablet [id number] Yes Take 1,000 mg by mouth every 6 (six) hours as needed for mild pain (pain score 1-3) or moderate pain (pain score 4-6). [provider]  Active Self  ARIPiprazole (ABILIFY) 15 MG tablet [id number] Yes Take 15 mg by mouth at bedtime.  [provider]  Active Self  bismuth subsalicylate (PEPTO BISMOL) 262 MG/15ML suspension [id number] Yes Take 30 mLs by mouth every 6 (six) hours as needed for indigestion. [provider]  Active Self  carboxymethylcellulose (REFRESH PLUS) 0.5 % SOLN [id number] Yes Place 1 drop into both eyes 3 (three) times daily as needed (dry/irritated eyes). [provider]  Active Self  Cholecalciferol (VITAMIN D) 125 MCG (5000 UT) CAPS [id number] Yes Take 5,000 Units by mouth daily.  [provider]  Active Self  clotrimazole-betamethasone (LOTRISONE) cream [id number] Yes Apply 1 Application topically 2 (two) times daily as needed (itching). [provider]  Active Self  escitalopram (LEXAPRO) 20 MG tablet [id number] Yes Take 20 mg by mouth at bedtime.  [provider]  Active Self  fluticasone (FLONASE) 50 MCG/ACT nasal spray [id number] Yes Place 1 spray into both nostrils as needed for allergies. [provider]  Active Self  furosemide  (LASIX ) 20 MG tablet [id number] Yes Take 20 mg by mouth daily. [provider]  Active Self  lisinopril  (ZESTRIL ) 10 MG tablet [id number] Yes Take 10 mg by mouth daily. [provider]  Active Self  methylphenidate (RITALIN) 10 MG tablet [id number] Yes Take 10 mg by mouth in the morning. [provider]  Active Self  montelukast (SINGULAIR) 10 MG tablet [id number] Yes Take 10 mg by mouth at bedtime. [provider]  Active   oseltamivir  (TAMIFLU ) 75 MG capsule [id number]  Take 1 capsule (75 mg total) by mouth every 12 (twelve) hours.  Patient not taking: Reported on 03/14/2024   Hildegard Loge, PA-C  Active   predniSONE  (DELTASONE ) 20 MG tablet [id number] Yes Take 2 tablets (40 mg total) by mouth daily with breakfast. Hildegard Loge, PA-C  Active   rosuvastatin (CRESTOR) 20 MG tablet [id number] Yes Take 20 mg by mouth daily. [provider]  Active Self  spironolactone (ALDACTONE) 25 MG tablet [id number] Yes Take 25 mg by mouth 2 (two) times daily. [provider]  Active Self  VENTOLIN  HFA 108 (90 Base) MCG/ACT inhaler [id number] Yes Inhale 2 puffs into the lungs every 6 (six) hours as needed for wheezing or shortness of breath. Pt reports recent visit to the ED (FLU) this medication was restarted. [provider]  Active Self           Med Note [name]   Dju Aug 11, 2023  5:15 PM) Patient in  need of a refill.  vitamin B-12 (CYANOCOBALAMIN) 1000 MCG  tablet [id number] Yes Take 1,000 mcg by mouth daily. [provider]  Active Self            Recommendation:   PCP Follow-up Continue Current Plan of Care  Follow Up Plan:   Telephone follow up appointment date/time:  04/14/2024 @ 10:00 am with Elida Gutter, RNCM   Olam Ku, RN, BSN [place]  [facility], [facility] RN Care Manager Direct Dial: [phone]  Fax: [phone]

## 2024-03-14 NOTE — Patient Instructions (Signed)
 Visit Information  Thank you for taking time to visit with me today. Please don't hesitate to contact me if I can be of assistance to you before our next scheduled appointment.  Your next care management appointment is by telephone on 04/14/2024 @ 10:00 am with Elida Gutter, RNCM   Please call the care guide team at [phone] if you need to cancel, schedule, or reschedule an appointment.   Please  if you are experiencing a Mental Health or Behavioral Health Crisis or need someone to talk to.   Olam Ku, RN, BSN [place]  [facility], [facility] RN Care Manager Direct Dial: [phone]  Fax: [phone]

## 2024-04-14 ENCOUNTER — Telehealth
